# Patient Record
Sex: Female | Born: 1935 | Race: White | Hispanic: No | Marital: Married | Smoking: Never smoker
Health system: Southern US, Community
[De-identification: ages and names within clinical notes are randomized; demographics above are authoritative.]

## PROBLEM LIST (undated history)

## (undated) DIAGNOSIS — I2510 Atherosclerotic heart disease of native coronary artery without angina pectoris: Secondary | ICD-10-CM

## (undated) DIAGNOSIS — M169 Osteoarthritis of hip, unspecified: Secondary | ICD-10-CM

## (undated) DIAGNOSIS — I4891 Unspecified atrial fibrillation: Secondary | ICD-10-CM

## (undated) DIAGNOSIS — R000 Tachycardia, unspecified: Secondary | ICD-10-CM

## (undated) DIAGNOSIS — E785 Hyperlipidemia, unspecified: Secondary | ICD-10-CM

## (undated) DIAGNOSIS — H409 Unspecified glaucoma: Secondary | ICD-10-CM

## (undated) HISTORY — DX: Atherosclerotic heart disease of native coronary artery without angina pectoris: I25.10

## (undated) HISTORY — DX: Osteoarthritis of hip, unspecified: M16.9

## (undated) HISTORY — DX: Unspecified glaucoma: H40.9

## (undated) HISTORY — PX: ABLATION: SHX5711

## (undated) HISTORY — DX: Unspecified atrial fibrillation: I48.91

## (undated) HISTORY — DX: Hyperlipidemia, unspecified: E78.5

---

## 2012-01-26 DIAGNOSIS — F458 Other somatoform disorders: Secondary | ICD-10-CM | POA: Insufficient documentation

## 2020-02-18 ENCOUNTER — Other Ambulatory Visit: Payer: Self-pay

## 2020-02-18 ENCOUNTER — Encounter: Payer: Self-pay | Admitting: Internal Medicine

## 2020-02-18 ENCOUNTER — Ambulatory Visit (INDEPENDENT_AMBULATORY_CARE_PROVIDER_SITE_OTHER): Payer: Medicare Other | Admitting: Internal Medicine

## 2020-02-18 VITALS — BP 102/70 | HR 100 | Temp 99.1°F | Ht 67.5 in | Wt 133.5 lb

## 2020-02-18 DIAGNOSIS — R5383 Other fatigue: Secondary | ICD-10-CM

## 2020-02-18 DIAGNOSIS — M169 Osteoarthritis of hip, unspecified: Secondary | ICD-10-CM | POA: Insufficient documentation

## 2020-02-18 DIAGNOSIS — E785 Hyperlipidemia, unspecified: Secondary | ICD-10-CM | POA: Diagnosis not present

## 2020-02-18 DIAGNOSIS — I480 Paroxysmal atrial fibrillation: Secondary | ICD-10-CM | POA: Diagnosis not present

## 2020-02-18 DIAGNOSIS — I4891 Unspecified atrial fibrillation: Secondary | ICD-10-CM | POA: Insufficient documentation

## 2020-02-18 DIAGNOSIS — M160 Bilateral primary osteoarthritis of hip: Secondary | ICD-10-CM

## 2020-02-18 DIAGNOSIS — I2510 Atherosclerotic heart disease of native coronary artery without angina pectoris: Secondary | ICD-10-CM

## 2020-02-18 DIAGNOSIS — H409 Unspecified glaucoma: Secondary | ICD-10-CM

## 2020-02-18 NOTE — Patient Instructions (Signed)
-  Nice seeing you today!!  -Referrals to cardiology and eye doctor have been requested today.  -Please send [place] records from your previous doctor in [place].  -See you back in 6 months.

## 2020-02-18 NOTE — Progress Notes (Signed)
New Patient Office Visit     This visit occurred during the SARS-CoV-2 public health emergency.  Safety protocols were in place, including screening questions prior to the visit, additional usage of staff PPE, and extensive cleaning of exam room while observing appropriate contact time as indicated for disinfecting solutions.    CC/Reason for Visit: Establish care, discuss chronic conditions, discuss an acute concern Previous PCP: In [place] Last Visit: June 2021  HPI: Brooke [name] is a 85 y.o. female who is coming in today for the above mentioned reasons.  She recently moved from [place] to [place] to be closer to her family.  Past Medical History is significant for: Coronary artery disease status post stent placement in the past 12 months, atrial fibrillation status post multiple ablations in the last year, she states she occasionally will feel like her heart is racing.  History of hyperlipidemia, glaucoma, bilateral hip osteoarthritis, she is status post a right hip replacement.  She has been complaining of decreased energy.  Feels a little sad and depressed.  She denies chest pain or shortness of breath.  She has had her Covid vaccines.   Past Medical/Surgical History: Past Medical History:  Diagnosis Date  . Atrial fibrillation (HCC)   . CAD (coronary artery disease)   . Glaucoma   . Hyperlipidemia   . OA (osteoarthritis) of hip     History reviewed. No pertinent surgical history.  Social History:  reports that she has never smoked. She has never used smokeless tobacco. She reports that she does not drink alcohol and does not use drugs.  Allergies: No Known Allergies  Family History:  Family History  Problem Relation Age of Onset  . CVA Mother   . CAD Father   . Aortic stenosis Sister      Current Outpatient Medications:  .  ALPRAZolam (XANAX) 0.5 MG tablet, Take by mouth., Disp: , Rfl:  .  amLODipine (NORVASC) 5 MG tablet, Take by mouth., Disp: , Rfl:  .   apixaban (ELIQUIS) 5 MG TABS tablet, Take 1 tablet by mouth 2 (two) times daily., Disp: , Rfl:  .  Aspirin Buf,CaCarb-MgCarb-MgO, 81 MG TABS, Take by mouth., Disp: , Rfl:  .  atorvastatin (LIPITOR) 40 MG tablet, Take by mouth., Disp: , Rfl:  .  Cholecalciferol 25 MCG (1000 UT) tablet, Take by mouth., Disp: , Rfl:  .  flecainide (TAMBOCOR) 50 MG tablet, Take by mouth., Disp: , Rfl:  .  latanoprost (XALATAN) 0.005 % ophthalmic solution, Place 1 drop into both eyes at bedtime., Disp: , Rfl:  .  losartan (COZAAR) 50 MG tablet, Take 1 tablet by mouth 2 (two) times daily., Disp: , Rfl:  .  metoprolol tartrate (LOPRESSOR) 25 MG tablet, Take 1 tablet by mouth 2 (two) times daily as needed., Disp: , Rfl:   Review of Systems:  Constitutional: Denies fever, chills, diaphoresis, appetite change. HEENT: Denies photophobia, eye pain, redness, hearing loss, ear pain, congestion, sore throat, rhinorrhea, sneezing, mouth sores, trouble swallowing, neck pain, neck stiffness and tinnitus.   Respiratory: Denies SOB, DOE, cough, chest tightness,  and wheezing.   Cardiovascular: Denies chest pain, palpitations and leg swelling.  Gastrointestinal: Denies nausea, vomiting, abdominal pain, diarrhea, constipation, blood in stool and abdominal distention.  Genitourinary: Denies dysuria, urgency, frequency, hematuria, flank pain and difficulty urinating.  Endocrine: Denies: hot or cold intolerance, sweats, changes in hair or nails, polyuria, polydipsia. Musculoskeletal: Denies myalgias, back pain, joint swelling, arthralgias and gait problem.  Skin: Denies pallor,  rash and wound.  Neurological: Denies dizziness, seizures, syncope, weakness, light-headedness, numbness and headaches.  Hematological: Denies adenopathy. Easy bruising, personal or family bleeding history  Psychiatric/Behavioral: Denies suicidal ideation, mood changes, confusion, nervousness, sleep disturbance and agitation    Physical Exam: Vitals:    02/18/20 1410  BP: 102/70  Pulse: 100  Temp: 99.1 F (37.3 C)  TempSrc: Oral  SpO2: 96%  Weight: 133 lb 8 oz (60.6 kg)  Height: 5' 7.5" (1.715 m)   Body mass index is 20.6 kg/m.  Constitutional: NAD, calm, comfortable Eyes: PERRL, lids and conjunctivae normal ENMT: Mucous membranes are moist.  Respiratory: clear to auscultation bilaterally, no wheezing, no crackles. Normal respiratory effort. No accessory muscle use.  Cardiovascular: Regular rate and rhythm, no murmurs / rubs / gallops. No extremity edema. 2+ pedal pulses. No carotid bruits.  Neurologic: Grossly intact and nonfocal Psychiatric: Normal judgment and insight. Alert and oriented x 3. Normal mood.    Impression and Plan:  Coronary artery disease involving native coronary artery of native heart without angina pectoris  - Plan: Ambulatory referral to Cardiology -Especially as her extreme fatigue could be an anginal equivalent.  Paroxysmal atrial fibrillation (HCC)  - Plan: Ambulatory referral to Cardiology -Status post 3 ablations in 2020. -She is on flecainide, metoprolol, anticoagulated on Eliquis. -She appears to be in sinus rhythm today.  Hyperlipidemia, unspecified hyperlipidemia type  -Obtain most recent lipid panel.  Glaucoma, unspecified glaucoma type, unspecified laterality  - Plan: Ambulatory referral to Ophthalmology  Osteoarthritis of both hips, unspecified osteoarthritis type -Status post right hip replacement, she does not believe she wants to replace her left hip as her heart situation is not ideal.  Fatigue -Differential diagnosis at this point is broad and could include depression, an anginal equivalent especially given her history of coronary artery disease, hypothyroidism, vitamin D/vitamin B12 deficiency, anemia. -   Office Visit from 02/18/2020 in [facility] [place]  PHQ-9 Total Score 4     -She has scored a 4 on PHQ-9 today. -She prefers to obtain recent lab work that she  had in June from her PCP in [place] before [employer] here.  She will have this faxed over to [place]. -If lab work is normal can consider treatment for depression. -She will also be referred to cardiology given her complex cardiac history.    Patient Instructions  -Nice seeing you today!!  -Referrals to cardiology and eye doctor have been requested today.  -Please send [place] records from your previous doctor in [place].  -See you back in 6 months.     Chaya Jan, MD  Primary Care at [facility]

## 2020-02-23 ENCOUNTER — Other Ambulatory Visit: Payer: Self-pay

## 2020-02-23 ENCOUNTER — Encounter: Payer: Self-pay | Admitting: Cardiology

## 2020-02-23 ENCOUNTER — Ambulatory Visit (INDEPENDENT_AMBULATORY_CARE_PROVIDER_SITE_OTHER): Payer: Medicare Other | Admitting: Cardiology

## 2020-02-23 VITALS — BP 124/80 | HR 75 | Temp 97.1°F | Ht 66.5 in | Wt 134.0 lb

## 2020-02-23 DIAGNOSIS — Z7189 Other specified counseling: Secondary | ICD-10-CM

## 2020-02-23 DIAGNOSIS — I25119 Atherosclerotic heart disease of native coronary artery with unspecified angina pectoris: Secondary | ICD-10-CM | POA: Diagnosis not present

## 2020-02-23 DIAGNOSIS — I480 Paroxysmal atrial fibrillation: Secondary | ICD-10-CM | POA: Diagnosis not present

## 2020-02-23 DIAGNOSIS — R5383 Other fatigue: Secondary | ICD-10-CM

## 2020-02-23 DIAGNOSIS — R0600 Dyspnea, unspecified: Secondary | ICD-10-CM | POA: Diagnosis not present

## 2020-02-23 DIAGNOSIS — I10 Essential (primary) hypertension: Secondary | ICD-10-CM

## 2020-02-23 DIAGNOSIS — E785 Hyperlipidemia, unspecified: Secondary | ICD-10-CM

## 2020-02-23 DIAGNOSIS — R0609 Other forms of dyspnea: Secondary | ICD-10-CM

## 2020-02-23 DIAGNOSIS — Z8673 Personal history of transient ischemic attack (TIA), and cerebral infarction without residual deficits: Secondary | ICD-10-CM

## 2020-02-23 NOTE — Progress Notes (Signed)
Cardiology Office Note:    Date:  02/23/2020   ID:  Brooke [name], DOB 09-27-1934, MRN [id number]  PCP:  Philip Aspen, Limmie Patricia, MD  Cardiologist:  Jodelle Red, MD  Referring MD: Philip Aspen, Estel*   CC: new patient evaluation for fatigue  History of Present Illness:    Brooke [name] is a 85 y.o. female with a hx of CAD, hyperlipidemia, atrial fibrillation, atrial flutter, paroxysmal atrial tachycardia, TIA, hypertension who is seen as a new consult at the request of Philip Aspen, Almira Bar* for the evaluation and management of increasing fatigue.  Note from 02/18/20 reviewed. Initial visit with Dr. Flonnie Hailstone, had moved from [place] to [place], prior records requested. Noted to have prior history of CAD with stent placement (noted as recent), atrial fibrillation with multiple past ablations. Noted to have increasing fatigue.  Able to access note from 09/22/19 from Dr. Weber Cooks at [facility]. Noted cardiac history summarized as below: CTI Flutter ablation 05/13/2018 Trialed on tikosyn, did not control her her atrial fibrillation. Noted to have conversion pauses. PVI/SVT ablation 05/16/2019 Atrial tachycardia noted post ablation 05/2019, had EP study On flecainide for brief paroxysmal atrial tachycardia seen on monitor. Amiodarone caused her to have palpitations  Patient concerns: Since her ablations (most recent 05/2019), hasn't had appreciable atrial fibrillation events, but does have occasional paroxysmal Atach events. Longest last 20-30 minutes. Occurs about once a month or so. No clear aggravating/alleviating factors, though does notice if she is nervous and "lets things get to me" her heart will speed up.   Has intermittent chest discomfort. Happens about once a month, different timing than her racing heart beats. She attributes this to long standing Da [place] syndrome. However, this is worse with exertion and better with rest. This has been a stable frequency of  symptoms for some time. Episodes last about an hour. No lightheadedness, nausea, diaphoresis, shortness of breath.   Has been told by several doctors that her flecainide is dangerous after having stents placed. She notes that nothing else has worked.   Wears nocturnal O2, reports that she hasn't been told she needs CPAP.   Energy level has been very low. Can barely wash dishes without feeling like she needs to sit down. Feels weak and shaky. Has also noted depressed mood, has been trialed on depression medications in the past but doesn't feel like it helped. No thoughts of hurting herself. Feels weak and fatigued even when she is resting, more of a constant issue than an exertional one.   Able to walk without chest pain or shortness of breath, though walks slowly with her cane.   She had stents placed after she had two episodes of syncope without any prodrome. No other symptoms at the time.  Denies shortness of breath at rest or with normal exertion. No PND, orthopnea, LE edema or unexpected weight gain. No syncope.  ROS for intermittent lower abdominal pain, can be both right and left sided. Worse with twisting.   Doesn't remember having any trouble with her chemical stress test in the past.   Past Medical History:  Diagnosis Date  . Atrial fibrillation (HCC)   . CAD (coronary artery disease)   . Glaucoma   . Hyperlipidemia   . OA (osteoarthritis) of hip     No past surgical history on file.  Current Medications: Current Outpatient Medications on File Prior to Visit  Medication Sig  . ALPRAZolam (XANAX) 0.5 MG tablet Take by mouth.  Marland Kitchen amLODipine (NORVASC) 5 MG tablet  Take by mouth.  Marland Kitchen apixaban (ELIQUIS) 5 MG TABS tablet Take 1 tablet by mouth 2 (two) times daily.  . Aspirin Buf,CaCarb-MgCarb-MgO, 81 MG TABS Take by mouth.  Marland Kitchen atorvastatin (LIPITOR) 40 MG tablet Take by mouth.  . Cholecalciferol 25 MCG (1000 UT) tablet Take by mouth.  . flecainide (TAMBOCOR) 50 MG tablet Take by  mouth.  . latanoprost (XALATAN) 0.005 % ophthalmic solution Place 1 drop into both eyes at bedtime.  Marland Kitchen losartan (COZAAR) 50 MG tablet Take 1 tablet by mouth 2 (two) times daily.  . metoprolol tartrate (LOPRESSOR) 25 MG tablet Take 1 tablet by mouth 2 (two) times daily as needed.   No current facility-administered medications on file prior to visit.     Allergies:   Patient has no known allergies.   Social History   Tobacco Use  . Smoking status: Never Smoker  . Smokeless tobacco: Never Used  Substance Use Topics  . Alcohol use: Never  . Drug use: Never    Family History: family history includes Aortic stenosis in her sister; CAD in her father; CVA in her mother.  ROS:   Please see the history of present illness.  Additional pertinent ROS: Constitutional: Negative for chills, fever, night sweats, unintentional weight loss  HENT: Negative for ear pain and hearing loss.   Eyes: Negative for loss of vision and eye pain.  Respiratory: Negative for cough, sputum, wheezing.   Cardiovascular: See HPI. Gastrointestinal: Negative for melena, and hematochezia. Positive for intermittent abdominal pain Genitourinary: Negative for dysuria and hematuria.  Musculoskeletal: Negative for falls and myalgias.  Skin: Negative for itching and rash.  Neurological: Negative for focal weakness, focal sensory changes and loss of consciousness.  Endo/Heme/Allergies: Does not bruise/bleed easily.     EKGs/Labs/Other Studies Reviewed:    The following studies were reviewed today: Echo 05/10/2018 (care everywhere) Interpretation Summary  A complete two-dimensional transthoracic echocardiogram with color flow  Doppler and spectral Doppler was performed. The left ventricle is grossly  normal size.  There is proximal septal thickening of the elderly.  The left ventricular ejection fraction is normal (65-70%).  The left ventricular wall motion is normal.  The aortic valve is trileaflet.  Mild aortic  sclerosis is present with good valvular opening.  There is mild [1+] aortic regurgitation present.  Unable to adequately determine diastolic dysfunction.  There is moderate (2+) tricuspid regurgitation.  Right ventricular systolic pressure is normal.  There is mild (1+) mitral regurgitation.  The left atrium is moderately dilated.  The right atrium is moderately dilated.   Cardiac cath 11/23/2016 (care everywhere) PRESSURE DETERMINATIONS: Aortic pressure was 172/76 mmHg with a mean  pressure 114 mmHg. Left ankle pressure was 172/18 mmHg. There was no  pressure gradient noted on pullback acrossthe aortic valve.   CORONARY ANGIOGRAPHY: The coronary angiograms are technically  satisfactory.   LEFT MAIN: Left main coronary artery has luminal irregularities.   LEFT ANTERIOR DESCENDING: Left anterior descending coronary artery has  mild proximal to mid ectasia with proximal 30% narrowing. The vessel is  moderate in size and extends transapically. First, second, and third  diagonal branches are small with luminal irregularities. There are  sequential obstructive narrowings in the mid to distal vessel with maximal  narrowing of 80% to 90%.   CIRCUMFLEX: The circumflex system gives rise to a moderate size first  obtuse marginal branch with luminal irregularities. Second obtuse  marginal branch has critical 90% proximal narrowing. The distal  circumflex gives rise to a small  to moderate size branch, which supplies a  posterolateral territory and has luminal irregularities.   RIGHT CORONARY ARTERY: The right coronary artery is a small caliber  codominant vessel with luminal irregularities.   CORONARY INTERVENTION: Following angiography, we proceeded with  intervention for the critical obstructive narrowings within the left  anterior descending coronary artery followed by the circumflex second  obtuse marginal branch.The patient received a weight-based Angiomax  bolus  followed by an Angiomax infusion. She also received 180 mg loading  dose of Brilinta.   The ostium of the left main was engaged with a 6-French XB 3.5 guiding  catheter. The area of culprit disease in the left anterior descending  coronary artery was crossed without difficulty with a 0.014-inch 190 cm  long BMW wire. Predilation was performed with a 2.0 x 12 mm Sprinter  Legend balloon inflated to 10 atmospheres for 20 seconds. We then  administered 200 mcg of intracoronary nitroglycerin. The stenosis was  stented with a 2.25 x 22 mm medicated Resolute Onyx stent deployed at 12  atmospheres for 30 seconds. The proximal and mid portion of the stent  were postdilated with a 2.5 x 15 mm Ottertail Trek balloon inflated to 12  atmospheres for 20 seconds.   Imaging following removal of intracoronary guidewire reveals an acceptable  result. Preprocedure stenosis was maximally 90%, postprocedure residual  was 0%. Pre- and post-procedure TIMI flow are grade 3. This was a type C  lesion.   INTERVENTION OF THE CIRCUMFLEX SECOND OBTUSE MARGINAL BRANCH: We then  directed the BMW wire down the second obtuse marginal branch.   Pre-dilatation was performed with a 2.0 x 12 mm Sprinter Legend balloon  inflated to 10 atmospheres for 20 seconds. We then stented the stenosis  with a 2.5 x 15 mm medicated Resolute Onyx stent, which was deployed at 12  atmospheres for 20 seconds.   Imaging following removal of intracoronary guidewire reveals an acceptable  result. Preprocedure stenosis was 90%, postprocedure residual was 0%.   Pre- and post-procedure TIMI flow are grade 3. This was a type C lesion.   FINAL DIAGNOSES:  1. Critical obstructive narrowing of the mid to distal left anterior  descending coronary artery, status post stenting with placement of a  medicated stent, with acceptable results.  2. Obstructive narrowing second obtuse marginal branch, status post  stenting with placement of a  medicated stent with acceptable results.   RECOMMENDATIONS: Continued post-procedure care with dual antiplatelet  therapy.   EKG:  EKG is personally reviewed.  The ekg ordered today demonstrates NSR at 75 bpm  Recent Labs: No results found for requested labs within last 8760 hours.  Recent Lipid Panel No results found for: CHOL, TRIG, HDL, CHOLHDL, VLDL, LDLCALC, LDLDIRECT  Physical Exam:    VS:  BP 124/80   Pulse 75   Temp (!) 97.1 F (36.2 C)   Ht 5' 6.5" (1.689 m)   Wt 134 lb (60.8 kg)   SpO2 94%   BMI 21.30 kg/m     Wt Readings from Last 3 Encounters:  02/18/20 133 lb 8 oz (60.6 kg)    GEN: Well nourished, well developed in no acute distress HEENT: Normal, moist mucous membranes NECK: No JVD CARDIAC: regular rhythm, normal S1 and S2, no rubs or gallops. No murmurs. VASCULAR: Radial and DP pulses 2+ bilaterally. No carotid bruits RESPIRATORY:  Clear to auscultation without rales, wheezing or rhonchi  ABDOMEN: Soft, non-tender, non-distended MUSCULOSKELETAL:  Ambulates independently SKIN: Warm and dry,  no edema NEUROLOGIC:  Alert and oriented x 3. No focal neuro deficits noted. PSYCHIATRIC:  Normal affect    ASSESSMENT:    1. Coronary artery disease involving native coronary artery of native heart with angina pectoris (HCC)   2. Fatigue, unspecified type   3. Paroxysmal atrial fibrillation (HCC)   4. DOE (dyspnea on exertion)   5. Hyperlipidemia, unspecified hyperlipidemia type   6. History of TIA (transient ischemic attack)   7. Essential hypertension   8. Cardiac risk counseling   9. Counseling on health promotion and disease prevention    PLAN:    Fatigue, dyspnea on exertion, known history of CAD with prior stents: had never had traditional angina, concern this is an anginal equivalent -discussed treadmill stress, nuclear stress/lexiscan, and CT coronary angiography. Discussed pros and cons of each, including but not limited to false positive/false  negative risk, radiation risk, and risk of IV contrast dye. Based on shared decision making, decision was made to pursue lexiscan myoview nuclear stress test -will get echo to evaluate for other potential structure heart issues  Atrial fibrillation, atrial flutter, atrial tachycardia: extensive history, see summary -I echoed what other providers have told her, which is that I am concerned about flecainide given her known CAD and concern for angina -she understands the risks but states that nothing else has worked for her -will ask EP to discuss if she has any other options -currently taking apixaban, flecainide, and uses metoprolol only PRN Apixaban is very expensive, $400 for 3 mos while she is in the donut hole.   History of TIA Hyperlipidemia CAD -continue atorvastatin 40 mg daily -no aspirin as she is on apixaban  Hypertension: -continue losartan  Cardiac risk counseling and prevention recommendations: -recommend heart healthy/Mediterranean diet, with whole grains, fruits, vegetable, fish, lean meats, nuts, and olive oil. Limit salt. -recommend moderate walking, 3-5 times/week for 30-50 minutes each session. Aim for at least 150 minutes.week. Goal should be pace of 3 miles/hours, or walking 1.5 miles in 30 minutes -recommend avoidance of tobacco products. Avoid excess alcohol.  Plan for follow up: 3 mos or sooner based on results of testing  Jodelle Red, MD, PhD [place]  [facility]    Medication Adjustments/Labs and Tests Ordered: Current medicines are reviewed at length with the patient today.  Concerns regarding medicines are outlined above.  Orders Placed This Encounter  Procedures  . Ambulatory referral to Cardiac Electrophysiology  . MYOCARDIAL PERFUSION IMAGING  . EKG 12-Lead  . ECHOCARDIOGRAM COMPLETE   No orders of the defined types were placed in this encounter.   Patient Instructions  Medication Instructions:  Your Physician recommend you  continue on your current medication as directed.    *If you need a refill on your cardiac medications before your next appointment, please call your pharmacy*   Lab Work: None   Testing/Procedures: Your physician has requested that you have an echocardiogram. Echocardiography is a painless test that uses sound waves to create images of your heart. It provides your doctor with information about the size and shape of your heart and how well your heart's chambers and valves are working. This procedure takes approximately one hour. There are no restrictions for this procedure. [address]  Your physician has requested that you have a lexiscan myoview. For further information please visit [email or web address] Please follow instruction sheet, as given. [address]   Follow-Up: At [facility], you and your health needs are our priority.  As part of our continuing mission to provide you with exceptional heart care, we have created designated Provider Care Teams.  These Care Teams include your primary Cardiologist (physician) and Advanced Practice Providers (APPs -  Physician Assistants and Nurse Practitioners) who all work together to provide you with the care you need, when you need it.  We recommend signing up for the patient portal called "MyChart".  Sign up information is provided on this After Visit Summary.  MyChart is used to connect with patients for Virtual Visits (Telemedicine).  Patients are able to view lab/test results, encounter notes, upcoming appointments, etc.  Non-urgent messages can be sent to your provider as well.   To learn more about what you can do with MyChart, go to [email or web address].    Your next appointment:   3 month(s)  The format for your next appointment:   In Person  Provider:   Jodelle RedBridgette Deontra Pereyra, MD  You are scheduled for a Myocardial Perfusion Imaging Study.  Please arrive 15 minutes prior to your  appointment time for registration and insurance purposes.  The test will take approximately 3 to 4 hours to complete; you may bring reading material.  If someone comes with you to your appointment, they will need to remain in the main lobby due to limited space in the testing area. **If you are pregnant or breastfeeding, please notify the nuclear lab prior to your appointment**  How to prepare for your Myocardial Perfusion Test: . Do not eat or drink 3 hours prior to your test, except you may have water. . Do not consume products containing caffeine (regular or decaffeinated) 12 hours prior to your test. (ex: coffee, chocolate, sodas, tea). . Do bring a list of your current medications with you.  If not listed below, you may take your medications as normal. . Do wear comfortable clothes (no dresses or overalls) and walking shoes, tennis shoes preferred (No heels or open toe shoes are allowed). . Do NOT wear cologne, perfume, aftershave, or lotions (deodorant is allowed). . If these instructions are not followed, your test will have to be rescheduled.  Please report to [address] for your test.  If you have questions or concerns about your appointment, you can call the Nuclear Lab at [phone].  If you cannot keep your appointment, please provide 24 hours notification to the Nuclear Lab, to avoid a possible $50 charge to your account.    Signed, Jodelle RedBridgette Kasmira Cacioppo, MD PhD 02/23/2020  [facility]

## 2020-02-23 NOTE — Patient Instructions (Signed)
Medication Instructions:  Your Physician recommend you continue on your current medication as directed.    *If you need a refill on your cardiac medications before your next appointment, please call your pharmacy*   Lab Work: None   Testing/Procedures: Your physician has requested that you have an echocardiogram. Echocardiography is a painless test that uses sound waves to create images of your heart. It provides your doctor with information about the size and shape of your heart and how well your heart's chambers and valves are working. This procedure takes approximately one hour. There are no restrictions for this procedure. [address]  Your physician has requested that you have a lexiscan myoview. For further information please visit [email or web address] Please follow instruction sheet, as given. [address]   Follow-Up: At [facility], you and your health needs are our priority.  As part of our continuing mission to provide you with exceptional heart care, we have created designated Provider Care Teams.  These Care Teams include your primary Cardiologist (physician) and Advanced Practice Providers (APPs -  Physician Assistants and Nurse Practitioners) who all work together to provide you with the care you need, when you need it.  We recommend signing up for the patient portal called "MyChart".  Sign up information is provided on this After Visit Summary.  MyChart is used to connect with patients for Virtual Visits (Telemedicine).  Patients are able to view lab/test results, encounter notes, upcoming appointments, etc.  Non-urgent messages can be sent to your provider as well.   To learn more about what you can do with MyChart, go to [email or web address].    Your next appointment:   3 month(s)  The format for your next appointment:   In Person  Provider:   Jodelle Red, MD  You are scheduled for a Myocardial Perfusion Imaging  Study.  Please arrive 15 minutes prior to your appointment time for registration and insurance purposes.  The test will take approximately 3 to 4 hours to complete; you may bring reading material.  If someone comes with you to your appointment, they will need to remain in the main lobby due to limited space in the testing area. **If you are pregnant or breastfeeding, please notify the nuclear lab prior to your appointment**  How to prepare for your Myocardial Perfusion Test: . Do not eat or drink 3 hours prior to your test, except you may have water. . Do not consume products containing caffeine (regular or decaffeinated) 12 hours prior to your test. (ex: coffee, chocolate, sodas, tea). . Do bring a list of your current medications with you.  If not listed below, you may take your medications as normal. . Do wear comfortable clothes (no dresses or overalls) and walking shoes, tennis shoes preferred (No heels or open toe shoes are allowed). . Do NOT wear cologne, perfume, aftershave, or lotions (deodorant is allowed). . If these instructions are not followed, your test will have to be rescheduled.  Please report to [address] for your test.  If you have questions or concerns about your appointment, you can call the Nuclear Lab at [phone].  If you cannot keep your appointment, please provide 24 hours notification to the Nuclear Lab, to avoid a possible $50 charge to your account.

## 2020-03-09 ENCOUNTER — Telehealth: Payer: Self-pay | Admitting: Internal Medicine

## 2020-03-09 ENCOUNTER — Telehealth (HOSPITAL_COMMUNITY): Payer: Self-pay

## 2020-03-09 NOTE — Telephone Encounter (Signed)
Encounter complete. 

## 2020-03-09 NOTE — Telephone Encounter (Signed)
Patient states her eye dr that she went to in [place] gave her Latanoprost Op Sol 0.005% drops for [place].  She moved from [place] and now lives in [place].  She said Dr. Ardyth Harps is supposed to be referring her to a new eye dr, however she hasn't heard anything.  She is almost out of eye drops and wants to know if Dr. Ardyth Harps will call in some for her.  She usually gets  90 day supply from her other dr.  Pharmacy- [facility]

## 2020-03-10 NOTE — Telephone Encounter (Signed)
Ok to refill until she can be seen by ophtho. Can we check on the status of this referral?

## 2020-03-10 NOTE — Telephone Encounter (Signed)
Pt called to check on her message from yesterday. Informed pt that they have not had a moment to review it but as soon as we get a reply we will reach out to her.   Pt would like a call back at [phone]

## 2020-03-11 ENCOUNTER — Other Ambulatory Visit: Payer: Self-pay

## 2020-03-11 ENCOUNTER — Ambulatory Visit (HOSPITAL_COMMUNITY)
Admission: RE | Admit: 2020-03-11 | Payer: Medicare Other | Source: Ambulatory Visit | Attending: Cardiology | Admitting: Cardiology

## 2020-03-11 ENCOUNTER — Encounter: Payer: Self-pay | Admitting: *Deleted

## 2020-03-11 MED ORDER — LATANOPROST 0.005 % OP SOLN: OPHTHALMIC | 1 refills | Status: DC

## 2020-03-11 NOTE — Telephone Encounter (Signed)
Refill sent Referral sent to a different office. Left message on machine for patient to return our call.

## 2020-03-11 NOTE — Telephone Encounter (Signed)
Pt returned the call to get the status of her Rx pt is aware that the Rx was sent in.

## 2020-03-12 ENCOUNTER — Ambulatory Visit (HOSPITAL_COMMUNITY): Payer: Medicare Other | Attending: Cardiology

## 2020-03-12 DIAGNOSIS — I480 Paroxysmal atrial fibrillation: Secondary | ICD-10-CM | POA: Diagnosis not present

## 2020-03-12 DIAGNOSIS — I25119 Atherosclerotic heart disease of native coronary artery with unspecified angina pectoris: Secondary | ICD-10-CM | POA: Diagnosis not present

## 2020-03-12 DIAGNOSIS — R0609 Other forms of dyspnea: Secondary | ICD-10-CM

## 2020-03-12 DIAGNOSIS — R0600 Dyspnea, unspecified: Secondary | ICD-10-CM | POA: Diagnosis not present

## 2020-03-12 DIAGNOSIS — R5383 Other fatigue: Secondary | ICD-10-CM | POA: Diagnosis present

## 2020-03-12 LAB — ECHOCARDIOGRAM COMPLETE
Area-P 1/2: 3.89 cm2
S' Lateral: 3.1 cm

## 2020-03-18 ENCOUNTER — Telehealth (HOSPITAL_COMMUNITY): Payer: Self-pay

## 2020-03-18 NOTE — Telephone Encounter (Signed)
Encounter complete. 

## 2020-03-23 ENCOUNTER — Other Ambulatory Visit: Payer: Self-pay

## 2020-03-23 ENCOUNTER — Ambulatory Visit (HOSPITAL_COMMUNITY)
Admission: RE | Admit: 2020-03-23 | Discharge: 2020-03-23 | Disposition: A | Discharge status: Home / Self Care | Payer: Medicare Other | Source: Ambulatory Visit | Attending: Internal Medicine | Admitting: Internal Medicine

## 2020-03-23 ENCOUNTER — Telehealth: Payer: Self-pay | Admitting: Cardiology

## 2020-03-23 ENCOUNTER — Encounter (HOSPITAL_COMMUNITY): Payer: Medicare Other

## 2020-03-23 DIAGNOSIS — R5383 Other fatigue: Secondary | ICD-10-CM

## 2020-03-23 DIAGNOSIS — R0600 Dyspnea, unspecified: Secondary | ICD-10-CM | POA: Insufficient documentation

## 2020-03-23 DIAGNOSIS — I25119 Atherosclerotic heart disease of native coronary artery with unspecified angina pectoris: Secondary | ICD-10-CM | POA: Diagnosis not present

## 2020-03-23 DIAGNOSIS — I480 Paroxysmal atrial fibrillation: Secondary | ICD-10-CM

## 2020-03-23 LAB — MYOCARDIAL PERFUSION IMAGING
LV dias vol: 86 mL (ref 46–106)
LV sys vol: 31 mL
Peak HR: 82 {beats}/min
Rest HR: 66 {beats}/min
SDS: 0
SRS: 1
SSS: 1
TID: 1.12

## 2020-03-23 MED ORDER — TECHNETIUM TC 99M TETROFOSMIN IV KIT
32.0000 | PACK | Freq: Once | INTRAVENOUS | Status: AC | PRN
  Administered 2020-03-23: 32 via INTRAVENOUS
  Filled 2020-03-23: qty 32

## 2020-03-23 MED ORDER — AMINOPHYLLINE 25 MG/ML IV SOLN
75.0000 mg | Freq: Once | INTRAVENOUS | Status: AC
  Administered 2020-03-23: 75 mg via INTRAVENOUS

## 2020-03-23 MED ORDER — TECHNETIUM TC 99M TETROFOSMIN IV KIT
10.1000 | PACK | Freq: Once | INTRAVENOUS | Status: AC | PRN
  Administered 2020-03-23: 10.1 via INTRAVENOUS
  Filled 2020-03-23: qty 11

## 2020-03-23 MED ORDER — REGADENOSON 0.4 MG/5ML IV SOLN
0.4000 mg | Freq: Once | INTRAVENOUS | Status: AC
  Administered 2020-03-23: 0.4 mg via INTRAVENOUS

## 2020-03-23 NOTE — Telephone Encounter (Signed)
Follow Up:     Returning Brooke [name]'s call from yesterday, concerning his test results.  Please call back about 1:30

## 2020-03-23 NOTE — Telephone Encounter (Signed)
Pt updated with ECHO results and verbalized understanding.  

## 2020-03-29 ENCOUNTER — Other Ambulatory Visit: Payer: Self-pay | Admitting: Internal Medicine

## 2020-04-08 ENCOUNTER — Encounter: Payer: Self-pay | Admitting: Internal Medicine

## 2020-04-08 ENCOUNTER — Other Ambulatory Visit: Payer: Self-pay

## 2020-04-08 ENCOUNTER — Ambulatory Visit (INDEPENDENT_AMBULATORY_CARE_PROVIDER_SITE_OTHER): Payer: Medicare Other | Admitting: Internal Medicine

## 2020-04-08 VITALS — BP 120/80 | HR 73 | Temp 98.0°F | Wt 133.0 lb

## 2020-04-08 DIAGNOSIS — F339 Major depressive disorder, recurrent, unspecified: Secondary | ICD-10-CM

## 2020-04-08 DIAGNOSIS — Z23 Encounter for immunization: Secondary | ICD-10-CM

## 2020-04-08 DIAGNOSIS — I480 Paroxysmal atrial fibrillation: Secondary | ICD-10-CM

## 2020-04-08 DIAGNOSIS — I25119 Atherosclerotic heart disease of native coronary artery with unspecified angina pectoris: Secondary | ICD-10-CM

## 2020-04-08 DIAGNOSIS — R5383 Other fatigue: Secondary | ICD-10-CM | POA: Diagnosis not present

## 2020-04-08 MED ORDER — SERTRALINE HCL 25 MG PO TABS: 25.0000 mg | ORAL_TABLET | Freq: Every day | ORAL | 1 refills | Status: DC

## 2020-04-08 NOTE — Patient Instructions (Signed)
-  Nice seeing you today!!  -Lab work today; will notify you once results are available.  -Start Zoloft 25 mg daily at bedtime.  -Flu vaccine today.  -Schedule follow up in 8 weeks.

## 2020-04-08 NOTE — Progress Notes (Signed)
Established Patient Office Visit     This visit occurred during the SARS-CoV-2 public health emergency.  Safety protocols were in place, including screening questions prior to the visit, additional usage of staff PPE, and extensive cleaning of exam room while observing appropriate contact time as indicated for disinfecting solutions.    CC/Reason for Visit: Follow-up fatigue, depressed mood  HPI: Brooke [name] is a 85 y.o. female who is coming in today for the above mentioned reasons. Past Medical History is significant for: Coronary artery disease status post stent placement in the past 12 months, atrial fibrillation status post multiple ablations in the last year, she states she occasionally will feel like her heart is racing.  History of hyperlipidemia, glaucoma, bilateral hip osteoarthritis, she is status post a right hip replacement.  She has been complaining of decreased energy.  Feels a little sad and depressed.  The depressed mood of fatigue has been increasing since her last visit in mid August.  Since I last saw her she has touch base with cardiologist and has had an echo that showed a normal ejection fraction with grade 3 diastolic dysfunction and a low risk myocardial perfusion study.  She is requesting flu vaccine today.   Past Medical/Surgical History: Past Medical History:  Diagnosis Date  . Atrial fibrillation (HCC)   . CAD (coronary artery disease)   . Glaucoma   . Hyperlipidemia   . OA (osteoarthritis) of hip     No past surgical history on file.  Social History:  reports that she has never smoked. She has never used smokeless tobacco. She reports that she does not drink alcohol and does not use drugs.  Allergies: Allergies  Allergen Reactions  . Hydrocodone-Acetaminophen Anaphylaxis    Family History:  Family History  Problem Relation Age of Onset  . CVA Mother   . CAD Father   . Aortic stenosis Sister      Current Outpatient Medications:  .   acetaminophen (TYLENOL) 325 MG tablet, Take by mouth., Disp: , Rfl:  .  ALPRAZolam (XANAX) 0.5 MG tablet, Take 0.5 mg by mouth 2 (two) times daily as needed. , Disp: , Rfl:  .  amLODipine (NORVASC) 5 MG tablet, Take by mouth., Disp: , Rfl:  .  apixaban (ELIQUIS) 5 MG TABS tablet, Take 1 tablet by mouth 2 (two) times daily., Disp: , Rfl:  .  aspirin 81 MG EC tablet, Take by mouth., Disp: , Rfl:  .  Aspirin Buf,CaCarb-MgCarb-MgO, 81 MG TABS, Take by mouth., Disp: , Rfl:  .  atorvastatin (LIPITOR) 40 MG tablet, Take by mouth., Disp: , Rfl:  .  Calcium Carbonate-Vitamin D3 600-400 MG-UNIT TABS, Take 3 tablets by mouth daily., Disp: , Rfl:  .  Cholecalciferol 25 MCG (1000 UT) tablet, Take by mouth., Disp: , Rfl:  .  flecainide (TAMBOCOR) 50 MG tablet, Take by mouth., Disp: , Rfl:  .  latanoprost (XALATAN) 0.005 % ophthalmic solution, INSTILL 1 DROP INTO BOTH  EYES AT BEDTIME, Disp: 5 mL, Rfl: 5 .  losartan (COZAAR) 50 MG tablet, Take 1 tablet by mouth 2 (two) times daily., Disp: , Rfl:  .  metoprolol tartrate (LOPRESSOR) 25 MG tablet, Take 1 tablet by mouth 2 (two) times daily as needed., Disp: , Rfl:  .  OXYGEN, Inhale 2 L into the lungs., Disp: , Rfl:  .  sertraline (ZOLOFT) 25 MG tablet, Take 1 tablet (25 mg total) by mouth daily., Disp: 90 tablet, Rfl: 1  Review  of Systems:  Constitutional: Denies fever, chills, diaphoresis, appetite change.  HEENT: Denies photophobia, eye pain, redness, hearing loss, ear pain, congestion, sore throat, rhinorrhea, sneezing, mouth sores, trouble swallowing, neck pain, neck stiffness and tinnitus.   Respiratory: Denies SOB, DOE, cough, chest tightness,  and wheezing.   Cardiovascular: Denies chest pain, palpitations and leg swelling.  Gastrointestinal: Denies nausea, vomiting, abdominal pain, diarrhea, constipation, blood in stool and abdominal distention.  Genitourinary: Denies dysuria, urgency, frequency, hematuria, flank pain and difficulty urinating.    Endocrine: Denies: hot or cold intolerance, sweats, changes in hair or nails, polyuria, polydipsia. Musculoskeletal: Denies myalgias, back pain, joint swelling, arthralgias and gait problem.  Skin: Denies pallor, rash and wound.  Neurological: Denies dizziness, seizures, syncope, weakness, light-headedness, numbness and headaches.  Hematological: Denies adenopathy. Easy bruising, personal or family bleeding history  Psychiatric/Behavioral: Denies suicidal ideation, confusion, nervousness, sleep disturbance and agitation    Physical Exam: Vitals:   04/08/20 1003  BP: 120/80  Pulse: 73  Temp: 98 F (36.7 C)  TempSrc: Oral  SpO2: 98%  Weight: 133 lb (60.3 kg)    Body mass index is 20.83 kg/m.   Constitutional: NAD, calm, comfortable Eyes: PERRL, lids and conjunctivae normal ENMT: Mucous membranes are moist. Respiratory: clear to auscultation bilaterally, no wheezing, no crackles. Normal respiratory effort. No accessory muscle use.  Cardiovascular: regular rate and rhythm, no murmurs / rubs / gallops. No extremity edema. .  Neurologic: Grossly intact and nonfocal Psychiatric: Normal judgment and insight. Alert and oriented x 3. Normal mood.    Impression and Plan:  Depression, recurrent (HCC) Fatigue, unspecified type -Suspect fatigue is a symptom of depression.  Recent low risk myocardial perfusion study basically removes cardiac etiology. -Check B12, vitamin D, TSH, CBC today. -   Office Visit from 04/08/2020 in [facility] [place]  PHQ-9 Total Score 10     -With elevated PHQ score, I will start her on Zoloft 25 mg daily and she will return in 8 weeks for follow-up.  Paroxysmal atrial fibrillation (HCC)  -Rate controlled on metoprolol, rhythm controlled on flecainide, anticoagulated on Eliquis. -Followed by cardiology.  Need for influenza vaccination -Flu vaccine administered today.   Patient Instructions  -Nice seeing you today!!  -Lab work  today; will notify you once results are available.  -Start Zoloft 25 mg daily at bedtime.  -Flu vaccine today.  -Schedule follow up in 8 weeks.     Chaya Jan, MD [facility]

## 2020-04-08 NOTE — Addendum Note (Signed)
Addended by: Kern Reap B on: 04/08/2020 04:53 PM   Modules accepted: Orders

## 2020-04-09 LAB — CBC WITH DIFFERENTIAL/PLATELET
Absolute Monocytes: 684 cells/uL (ref 200–950)
Basophils Absolute: 42 cells/uL (ref 0–200)
Basophils Relative: 0.7 %
Eosinophils Absolute: 180 cells/uL (ref 15–500)
Eosinophils Relative: 3 %
HCT: 43.7 % (ref 35.0–45.0)
Hemoglobin: 14.3 g/dL (ref 11.7–15.5)
Lymphs Abs: 1668 cells/uL (ref 850–3900)
MCH: 31.9 pg (ref 27.0–33.0)
MCHC: 32.7 g/dL (ref 32.0–36.0)
MCV: 97.5 fL (ref 80.0–100.0)
MPV: 11.2 fL (ref 7.5–12.5)
Monocytes Relative: 11.4 %
Neutro Abs: 3426 cells/uL (ref 1500–7800)
Neutrophils Relative %: 57.1 %
Platelets: 215 10*3/uL (ref 140–400)
RBC: 4.48 10*6/uL (ref 3.80–5.10)
RDW: 11.6 % (ref 11.0–15.0)
Total Lymphocyte: 27.8 %
WBC: 6 10*3/uL (ref 3.8–10.8)

## 2020-04-09 LAB — COMPREHENSIVE METABOLIC PANEL
AG Ratio: 2.1 (calc) (ref 1.0–2.5)
ALT: 17 U/L (ref 6–29)
AST: 20 U/L (ref 10–35)
Albumin: 4.4 g/dL (ref 3.6–5.1)
Alkaline phosphatase (APISO): 96 U/L (ref 37–153)
BUN/Creatinine Ratio: 26 (calc) — ABNORMAL HIGH (ref 6–22)
BUN: 15 mg/dL (ref 7–25)
CO2: 30 mmol/L (ref 20–32)
Calcium: 9.7 mg/dL (ref 8.6–10.4)
Chloride: 102 mmol/L (ref 98–110)
Creat: 0.58 mg/dL — ABNORMAL LOW (ref 0.60–0.88)
Globulin: 2.1 g/dL (calc) (ref 1.9–3.7)
Glucose, Bld: 94 mg/dL (ref 65–99)
Potassium: 4.2 mmol/L (ref 3.5–5.3)
Sodium: 140 mmol/L (ref 135–146)
Total Bilirubin: 0.8 mg/dL (ref 0.2–1.2)
Total Protein: 6.5 g/dL (ref 6.1–8.1)

## 2020-04-09 LAB — VITAMIN D 25 HYDROXY (VIT D DEFICIENCY, FRACTURES): Vit D, 25-Hydroxy: 34 ng/mL (ref 30–100)

## 2020-04-09 LAB — TSH: TSH: 1.11 mIU/L (ref 0.40–4.50)

## 2020-04-09 LAB — VITAMIN B12: Vitamin B-12: 480 pg/mL (ref 200–1100)

## 2020-04-15 ENCOUNTER — Telehealth: Payer: Self-pay

## 2020-04-15 NOTE — Telephone Encounter (Signed)
Patient assistance form for Eliquis 5 mg BID faxed to [employer] patient assistance foundation.  Awaiting approval.

## 2020-04-19 ENCOUNTER — Encounter: Payer: Self-pay | Admitting: Cardiology

## 2020-04-19 DIAGNOSIS — I10 Essential (primary) hypertension: Secondary | ICD-10-CM | POA: Insufficient documentation

## 2020-04-19 DIAGNOSIS — Z8673 Personal history of transient ischemic attack (TIA), and cerebral infarction without residual deficits: Secondary | ICD-10-CM | POA: Insufficient documentation

## 2020-04-21 NOTE — Telephone Encounter (Signed)
Patient called in regards to note below. I advised her that form were sent on 10/7 and we are awaiting approval.

## 2020-04-21 NOTE — Telephone Encounter (Signed)
Spoke with pt and informed we received a fax from [facility] Patient [facility] stating pt is not eligible to receive assistance. Pt is required to show proof of spending at least 3% of household income on medications.   Pt notified and voiced she will drop off information at front desk for nurse to fax.

## 2020-04-26 ENCOUNTER — Other Ambulatory Visit: Payer: Self-pay

## 2020-04-26 ENCOUNTER — Encounter: Payer: Self-pay | Admitting: Cardiology

## 2020-04-26 ENCOUNTER — Ambulatory Visit (INDEPENDENT_AMBULATORY_CARE_PROVIDER_SITE_OTHER): Payer: Medicare Other | Admitting: Cardiology

## 2020-04-26 ENCOUNTER — Encounter: Payer: Self-pay | Admitting: *Deleted

## 2020-04-26 VITALS — BP 102/64 | HR 63 | Ht 67.0 in | Wt 137.2 lb

## 2020-04-26 DIAGNOSIS — I480 Paroxysmal atrial fibrillation: Secondary | ICD-10-CM | POA: Diagnosis not present

## 2020-04-26 DIAGNOSIS — I25119 Atherosclerotic heart disease of native coronary artery with unspecified angina pectoris: Secondary | ICD-10-CM

## 2020-04-26 DIAGNOSIS — Z8673 Personal history of transient ischemic attack (TIA), and cerebral infarction without residual deficits: Secondary | ICD-10-CM | POA: Diagnosis not present

## 2020-04-26 MED ORDER — METOPROLOL TARTRATE 25 MG PO TABS: 37.5000 mg | ORAL_TABLET | Freq: Two times a day (BID) | ORAL | 3 refills | Status: DC

## 2020-04-26 NOTE — Progress Notes (Signed)
Patient ID: Brooke [name], female   DOB: Jul 28, 1934, 85 y.o.   MRN: [id number] Patient enrolled for [employer] to ship a 14 day ZIO XT long term holter monitor to her home.

## 2020-04-26 NOTE — Patient Instructions (Addendum)
Medication Instructions:  Your physician has recommended you make the following change in your medication:   1.  STOP taking flecainide.  2.  INCREASE your metoprolol tartrate 25 mg-  Take 1.5 tablets (37.5 mg) by mouth twice a day  Lab Work: None ordered. If you have labs (blood work) drawn today and your tests are completely normal, you will receive your results only by: Marland Kitchen MyChart Message (if you have MyChart) OR . A paper copy in the mail If you have any lab test that is abnormal or we need to change your treatment, we will call you to review the results.  Testing/Procedures: Your physician has recommended that you wear a holter monitor. Holter monitors are medical devices that record the heart's electrical activity. Doctors most often use these monitors to diagnose arrhythmias. Arrhythmias are problems with the speed or rhythm of the heartbeat. The monitor is a small, portable device. You can wear one while you do your normal daily activities. This is usually used to diagnose what is causing palpitations/syncope (passing out).  14 day ZIO monitor  Follow-Up: At [facility], you and your health needs are our priority.  As part of our continuing mission to provide you with exceptional heart care, we have created designated Provider Care Teams.  These Care Teams include your primary Cardiologist (physician) and Advanced Practice Providers (APPs -  Physician Assistants and Nurse Practitioners) who all work together to provide you with the care you need, when you need it.  Your next appointment:   Your physician wants you to follow-up in: 3 months with Dr. Lalla Brothers.     July 22, 2019 at 2:30 pm  ZIO XT- Long Term Monitor Instructions   Your physician has requested you wear your ZIO patch monitor__14__days.   This is a single patch monitor.  [employer] supplies one patch monitor per enrollment.  Additional stickers are not available.   Please do not apply patch if you will be having  a Nuclear Stress Test, Echocardiogram, Cardiac CT, MRI, or Chest Xray during the time frame you would be wearing the monitor. The patch cannot be worn during these tests.  You cannot remove and re-apply the ZIO XT patch monitor.   Your ZIO patch monitor will be sent USPS Priority mail from [facility] directly to your home address. The monitor may also be mailed to a PO BOX if home delivery is not available.   It may take 3-5 days to receive your monitor after you have been enrolled.   Once you have received you monitor, please review enclosed instructions.  Your monitor has already been registered assigning a specific monitor serial # to you.   Applying the monitor   Shave hair from upper left chest.   Hold abrader disc by orange tab.  Rub abrader in 40 strokes over left upper chest as indicated in your monitor instructions.   Clean area with 4 enclosed alcohol pads .  Use all pads to assure are is cleaned thoroughly.  Let dry.   Apply patch as indicated in monitor instructions.  Patch will be place under collarbone on left side of chest with arrow pointing upward.   Rub patch adhesive wings for 2 minutes.Remove white label marked "1".  Remove white label marked "2".  Rub patch adhesive wings for 2 additional minutes.   While looking in a mirror, press and release button in center of patch.  A small green light will flash 3-4 times .  This will be your  only indicator the monitor has been turned on.     Do not shower for the first 24 hours.  You may shower after the first 24 hours.   Press button if you feel a symptom. You will hear a small click.  Record Date, Time and Symptom in the Patient Log Book.   When you are ready to remove patch, follow instructions on last 2 pages of Patient Log Book.  Stick patch monitor onto last page of Patient Log Book.   Place Patient Log Book in [place] box.  Use locking tab on box and tape box closed securely.  The [employer] has [employer] on it.  Please place in mailbox as soon as possible.  Your physician should have your test results approximately 7 days after the monitor has been mailed back to [facility].   Call [facility] Customer Care at [phone] if you have questions regarding your ZIO XT patch monitor.  Call them immediately if you see an orange light blinking on your monitor.   If your monitor falls off in less than 4 days contact our Monitor department at [phone].  If your monitor becomes loose or falls off after 4 days call [employer] at [phone] for suggestions on securing your monitor.

## 2020-04-26 NOTE — Progress Notes (Signed)
Electrophysiology Office Note:    Date:  04/26/2020   ID:  Brooke [name], DOB Nov 14, 1934, MRN [id number]  PCP:  Philip Aspen, Limmie Patricia, MD  [facility] Cardiologist:  Jodelle Red, MD  [facility] Electrophysiologist:  Lanier Prude, MD   Referring MD: Jodelle Red,*   Chief Complaint: Tachycardia  History of Present Illness:    Brooke [name] is a 85 y.o. female who presents for an evaluation of tachycardia at the request of Dr. Cristal Deer. Their medical history includes atrial fibrillation with prior ablations in November 2019 in November 2020, TIA, hyperlipidemia, coronary artery disease, hypertension.  For her atrial fibrillation and atrial flutter, she has been trialed on dofetilide which did not control her atrial fibrillation and led to postconversion pauses.  She is also taken amiodarone which based on chart review has caused worsened palpitations.  She tells me today that since her most recent ablation in 2020 she has not had palpitations that she correlates with atrial fibrillation.  She is however experiencing brief episodes (10 to 20 minutes at a time) of a elevated heart rate.  These are distinctly different from her previous atrial fibrillation episodes.  Exertion seems to bring up these episodes as does stress.  No associated syncope, presyncope.  She has previously taken flecainide which controlled these palpitations but recently she has had several physicians advised her to stop the flecainide given her recently discovered coronary artery disease.  In addition these palpitations, she is experienced generalized fatigue.  Despite this advice, she is continue to use flecainide.  Past Medical History:  Diagnosis Date   Atrial fibrillation (HCC)    CAD (coronary artery disease)    Glaucoma    Hyperlipidemia    OA (osteoarthritis) of hip     History reviewed. No pertinent surgical history.  Current Medications: Current Meds  Medication Sig    acetaminophen (TYLENOL) 325 MG tablet Take by mouth.   ALPRAZolam (XANAX) 0.5 MG tablet Take 0.5 mg by mouth 2 (two) times daily as needed.    amLODipine (NORVASC) 5 MG tablet Take by mouth.   apixaban (ELIQUIS) 5 MG TABS tablet Take 1 tablet by mouth 2 (two) times daily.   aspirin 81 MG EC tablet Take by mouth.   atorvastatin (LIPITOR) 40 MG tablet Take by mouth.   Cholecalciferol 25 MCG (1000 UT) tablet Take by mouth.   latanoprost (XALATAN) 0.005 % ophthalmic solution INSTILL 1 DROP INTO BOTH  EYES AT BEDTIME   losartan (COZAAR) 50 MG tablet Take 1 tablet by mouth daily.    OXYGEN Inhale 2 L into the lungs.   sertraline (ZOLOFT) 25 MG tablet Take 1 tablet (25 mg total) by mouth daily.   [DISCONTINUED] Aspirin Buf,CaCarb-MgCarb-MgO, 81 MG TABS Take by mouth.   [DISCONTINUED] Calcium Carbonate-Vitamin D3 600-400 MG-UNIT TABS Take 3 tablets by mouth daily.   [DISCONTINUED] flecainide (TAMBOCOR) 50 MG tablet Take by mouth.   [DISCONTINUED] metoprolol tartrate (LOPRESSOR) 25 MG tablet Take 1 tablet by mouth 2 (two) times daily.      Allergies:   Hydrocodone-acetaminophen   Social History   Socioeconomic History   Marital status: Married    Spouse name: Not on file   Number of children: Not on file   Years of education: Not on file   Highest education level: Not on file  Occupational History   Not on file  Tobacco Use   Smoking status: Never Smoker   Smokeless tobacco: Never Used  Substance and Sexual Activity  Alcohol use: Never   Drug use: Never   Sexual activity: Not on file  Other Topics Concern   Not on file  Social History Narrative   Not on file   Social Determinants of Health   Financial Resource Strain:    Difficulty of Paying Living Expenses: Not on file  Food Insecurity:    Worried About Running Out of Food in the Last Year: Not on file   Ran Out of Food in the Last Year: Not on file  Transportation Needs:    Lack of  Transportation (Medical): Not on file   Lack of Transportation (Non-Medical): Not on file  Physical Activity:    Days of Exercise per Week: Not on file   Minutes of Exercise per Session: Not on file  Stress:    Feeling of Stress : Not on file  Social Connections:    Frequency of Communication with Friends and Family: Not on file   Frequency of Social Gatherings with Friends and Family: Not on file   Attends Religious Services: Not on file   Active Member of Clubs or Organizations: Not on file   Attends Banker Meetings: Not on file   Marital Status: Not on file     Family History: The patient's family history includes Aortic stenosis in her sister; CAD in her father; CVA in her mother.  ROS:   Please see the history of present illness.    All other systems reviewed and are negative.  EKGs/Labs/Other Studies Reviewed:    The following studies were reviewed today: Prior records, echo  March 12, 2020 echo personally reviewed Left ventricular function is normal  right ventricular function is normal Mild to moderate tricuspid regurgitation, mild MR Left atrial dilation   EKG:  The ekg ordered today demonstrates sinus rhythm  Recent Labs: 04/08/2020: ALT 17; BUN 15; Creat 0.58; Hemoglobin 14.3; Platelets 215; Potassium 4.2; Sodium 140; TSH 1.11  Recent Lipid Panel No results found for: CHOL, TRIG, HDL, CHOLHDL, VLDL, LDLCALC, LDLDIRECT  Physical Exam:    VS:  BP 102/64    Pulse 63    Ht 5\' 7"  (1.702 m)    Wt 137 lb 3.2 oz (62.2 kg)    SpO2 98%    BMI 21.49 kg/m     Wt Readings from Last 3 Encounters:  04/26/20 137 lb 3.2 oz (62.2 kg)  04/08/20 133 lb (60.3 kg)  03/23/20 134 lb (60.8 kg)     GEN:  Well nourished, well developed in no acute distress HEENT: Normal NECK: No JVD; No carotid bruits LYMPHATICS: No lymphadenopathy CARDIAC: RRR, no murmurs, rubs, gallops RESPIRATORY:  Clear to auscultation without rales, wheezing or rhonchi    ABDOMEN: Soft, non-tender, non-distended MUSCULOSKELETAL:  No edema; No deformity  SKIN: Warm and dry NEUROLOGIC:  Alert and oriented x 3 PSYCHIATRIC:  Normal affect   ASSESSMENT:    1. Paroxysmal atrial fibrillation (HCC)   2. Coronary artery disease involving native coronary artery of native heart with angina pectoris (HCC)   3. History of TIA (transient ischemic attack)    PLAN:    In order of problems listed above:  1. Paroxysmal atrial fibrillation On Eliquis and tolerating well.  Has previously used Tikosyn and amiodarone which she did not tolerate.  Flecainide has worked well to control most recent palpitations but this is contraindicated in the setting of coronary artery disease.  I discussed the options for pharmacologic suppression of atrial tachycardia and atrial fibrillation including a class  III agent such as sotalol versus up titration of her metoprolol.  Given the infrequent nature of the episodes I recommended that we start with increasing her metoprolol to 37.5 mg twice daily.  We will stop the flecainide given the coronary artery disease.  She is agreed to do that today.  We will place a ZIO monitor for 14 days to quantify and formally assess what exactly is causing the palpitations she is experiencing (atrial tachycardia versus atrial fibrillation).  We will plan to see back in clinic in 6 to 8 weeks.  2.  Coronary artery disease No ischemic symptoms today.  Continue aspirin, metoprolol, atorvastatin. This precludes use of 1C agents  3.  History of TIA Continue Eliquis   Medication Adjustments/Labs and Tests Ordered: Current medicines are reviewed at length with the patient today.  Concerns regarding medicines are outlined above.  Orders Placed This Encounter  Procedures   LONG TERM MONITOR (3-14 DAYS)   EKG 12-Lead   Meds ordered this encounter  Medications   metoprolol tartrate (LOPRESSOR) 25 MG tablet    Sig: Take 1.5 tablets (37.5 mg total) by mouth 2  (two) times daily.    Dispense:  270 tablet    Refill:  3     Signed, Steffanie Dunn, MD, [facility]  04/26/2020 1:34 PM    Electrophysiology [facility]

## 2020-04-30 ENCOUNTER — Other Ambulatory Visit: Payer: Self-pay | Admitting: *Deleted

## 2020-04-30 MED ORDER — ALPRAZOLAM 0.5 MG PO TABS
0.5000 mg | ORAL_TABLET | Freq: Two times a day (BID) | ORAL | 0 refills | Status: DC | PRN
Start: 2020-04-30 — End: 2020-06-16

## 2020-04-30 NOTE — Telephone Encounter (Signed)
Refill request for  ALPRAZolam (XANAX) 0.5 MG tablet [facility]

## 2020-05-01 ENCOUNTER — Ambulatory Visit (INDEPENDENT_AMBULATORY_CARE_PROVIDER_SITE_OTHER): Payer: Medicare Other

## 2020-05-01 DIAGNOSIS — I480 Paroxysmal atrial fibrillation: Secondary | ICD-10-CM

## 2020-05-14 ENCOUNTER — Other Ambulatory Visit: Payer: Self-pay | Admitting: *Deleted

## 2020-05-14 MED ORDER — APIXABAN 5 MG PO TABS
5.0000 mg | ORAL_TABLET | Freq: Two times a day (BID) | ORAL | 0 refills | Status: DC
Start: 2020-05-14 — End: 2020-10-01

## 2020-05-14 NOTE — Telephone Encounter (Signed)
[facility] Refill request apixaban (ELIQUIS) 5 MG TABS tablet  Never filled by Dr Janeece Riggers to fill?

## 2020-05-26 ENCOUNTER — Emergency Department (HOSPITAL_COMMUNITY): Payer: Medicare Other

## 2020-05-26 ENCOUNTER — Telehealth: Payer: Self-pay | Admitting: Internal Medicine

## 2020-05-26 ENCOUNTER — Emergency Department (HOSPITAL_COMMUNITY)
Admission: EM | Admit: 2020-05-26 | Discharge: 2020-05-26 | Disposition: A | Discharge status: Home / Self Care | Payer: Medicare Other | Attending: Emergency Medicine | Admitting: Emergency Medicine

## 2020-05-26 ENCOUNTER — Encounter (HOSPITAL_COMMUNITY): Payer: Self-pay | Admitting: *Deleted

## 2020-05-26 ENCOUNTER — Other Ambulatory Visit: Payer: Self-pay

## 2020-05-26 DIAGNOSIS — I471 Supraventricular tachycardia: Secondary | ICD-10-CM | POA: Diagnosis not present

## 2020-05-26 DIAGNOSIS — R000 Tachycardia, unspecified: Secondary | ICD-10-CM | POA: Diagnosis present

## 2020-05-26 DIAGNOSIS — Z7982 Long term (current) use of aspirin: Secondary | ICD-10-CM | POA: Diagnosis not present

## 2020-05-26 DIAGNOSIS — I480 Paroxysmal atrial fibrillation: Secondary | ICD-10-CM | POA: Insufficient documentation

## 2020-05-26 DIAGNOSIS — I2510 Atherosclerotic heart disease of native coronary artery without angina pectoris: Secondary | ICD-10-CM | POA: Diagnosis not present

## 2020-05-26 DIAGNOSIS — Z79899 Other long term (current) drug therapy: Secondary | ICD-10-CM | POA: Diagnosis not present

## 2020-05-26 DIAGNOSIS — Z7901 Long term (current) use of anticoagulants: Secondary | ICD-10-CM | POA: Insufficient documentation

## 2020-05-26 DIAGNOSIS — R55 Syncope and collapse: Secondary | ICD-10-CM | POA: Insufficient documentation

## 2020-05-26 LAB — BASIC METABOLIC PANEL
Anion gap: 6 (ref 5–15)
BUN: 15 mg/dL (ref 8–23)
CO2: 31 mmol/L (ref 22–32)
Calcium: 9.4 mg/dL (ref 8.9–10.3)
Chloride: 102 mmol/L (ref 98–111)
Creatinine, Ser: 0.51 mg/dL (ref 0.44–1.00)
GFR, Estimated: >60 mL/min (ref >60)
Glucose, Bld: 104 mg/dL — ABNORMAL HIGH (ref 70–99)
Potassium: 3.9 mmol/L (ref 3.5–5.1)
Sodium: 139 mmol/L (ref 135–145)

## 2020-05-26 LAB — CBC WITH DIFFERENTIAL/PLATELET
Abs Immature Granulocytes: 0.01 10*3/uL (ref 0.00–0.07)
Basophils Absolute: 0 10*3/uL (ref 0.0–0.1)
Basophils Relative: 1 %
Eosinophils Absolute: 0.2 10*3/uL (ref 0.0–0.5)
Eosinophils Relative: 3 %
HCT: 45.5 % (ref 36.0–46.0)
Hemoglobin: 15 g/dL (ref 12.0–15.0)
Immature Granulocytes: 0 %
Lymphocytes Relative: 26 %
Lymphs Abs: 1.5 10*3/uL (ref 0.7–4.0)
MCH: 32.4 pg (ref 26.0–34.0)
MCHC: 33 g/dL (ref 30.0–36.0)
MCV: 98.3 fL (ref 80.0–100.0)
Monocytes Absolute: 0.6 10*3/uL (ref 0.1–1.0)
Monocytes Relative: 11 %
Neutro Abs: 3.3 10*3/uL (ref 1.7–7.7)
Neutrophils Relative %: 59 %
Platelets: 213 10*3/uL (ref 150–400)
RBC: 4.63 MIL/uL (ref 3.87–5.11)
RDW: 12.1 % (ref 11.5–15.5)
WBC: 5.6 10*3/uL (ref 4.0–10.5)
nRBC: 0 % (ref 0.0–0.2)

## 2020-05-26 LAB — MAGNESIUM: Magnesium: 2.4 mg/dL (ref 1.7–2.4)

## 2020-05-26 IMAGING — DX DG CHEST 1V PORT
1 series · 1 of 1 positions shown · non-contrast
Comparison: None.

CLINICAL DATA: Palpitations

EXAM:
PORTABLE CHEST 1 VIEW

[chest ap]
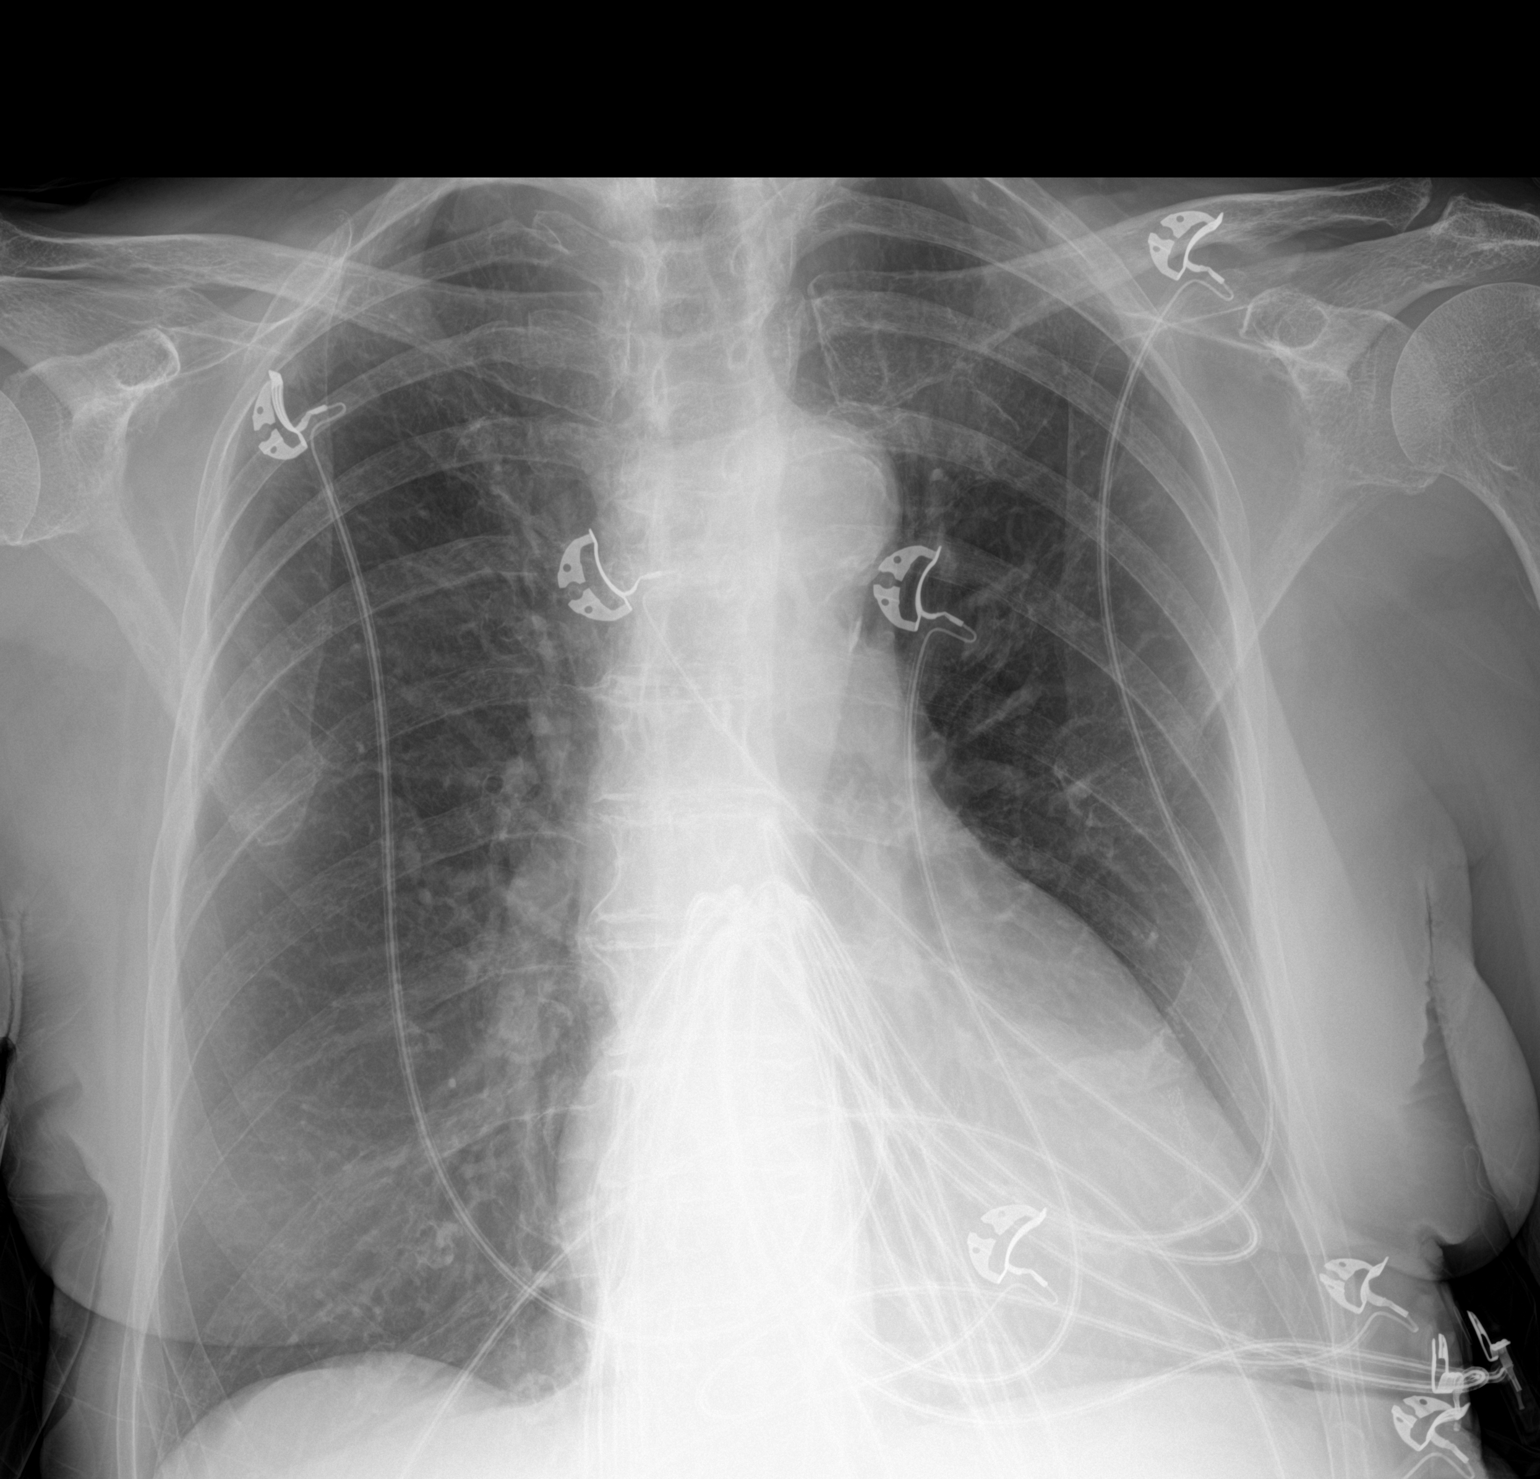

[1 of 1 positions shown; findings below may reference images not displayed]

FINDINGS: Heart size is mildly enlarged. Atherosclerotic calcification of the
aortic knob. Hyperinflated lungs. No focal airspace consolidation,
pleural effusion, or pneumothorax.
IMPRESSION: 1. Mild cardiomegaly without acute cardiopulmonary finding.
2. Hyperinflated lungs, suggesting COPD.

## 2020-05-26 MED ORDER — METOPROLOL TARTRATE 50 MG PO TABS: 50.0000 mg | ORAL_TABLET | Freq: Two times a day (BID) | ORAL | 0 refills | Status: DC

## 2020-05-26 NOTE — Discharge Instructions (Signed)
You were evaluated in the emergency department for rapid heart rate and some lightheadedness.  You were in normal sinus rhythm here.  Your lab work was unremarkable.  Cardiology is recommending that you increase your metoprolol to 50 mg twice a day.  Please follow-up with them as scheduled on Friday.  Return to the emergency department for any worsening or concerning symptoms.

## 2020-05-26 NOTE — ED Triage Notes (Addendum)
She reports palpitations and tachycardia this morning. Her blood pressure monitor read a heart rate of 164 this morning. She felt light headed and like she was going to faint. History of a-fib. She reports she was taken off flecainide a few weeks ago and increased her metoprolol.

## 2020-05-26 NOTE — ED Provider Notes (Signed)
[facility]-EMERGENCY DEPT Provider Note   CSN: [id number] Arrival date & time: 05/26/20  1120     History Chief Complaint  Patient presents with  . Tachycardia  . Palpitations    Brooke [name] is a 85 y.o. female.  She has a history of paroxysmal A. fib and has had ablations.  She continues to have paroxysmal A. fib.  She said this morning she felt lightheaded like she was going to faint and her heart was racing.  Her blood pressure monitor read a heart rate of 164.  This lasted about 30 minutes.  She says her usual episodes usually only last for 5 or 10 minutes.  She does not feel like her heart is racing now.  There was no loss of consciousness.  The history is provided by the patient.  Palpitations Palpitations quality:  Fast Onset quality:  Sudden Duration:  30 minutes Progression:  Resolved Chronicity:  Recurrent Relieved by:  None tried Worsened by:  Nothing Ineffective treatments:  None tried Associated symptoms: dizziness, malaise/fatigue and near-syncope   Associated symptoms: no chest pain, no chest pressure, no cough, no diaphoresis, no hemoptysis, no lower extremity edema, no shortness of breath, no syncope and no vomiting   Risk factors: hx of atrial fibrillation        Past Medical History:  Diagnosis Date  . Atrial fibrillation (HCC)   . CAD (coronary artery disease)   . Glaucoma   . Hyperlipidemia   . OA (osteoarthritis) of hip     Patient Active Problem List   Diagnosis Date Noted  . History of TIA (transient ischemic attack) 04/19/2020  . Essential hypertension 04/19/2020  . CAD (coronary artery disease)   . Atrial fibrillation (HCC)   . Hyperlipidemia   . Glaucoma   . OA (osteoarthritis) of hip   . 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 No pertinent surgical history.   OB History   No obstetric history on file.     Family History  Problem Relation Age of Onset  . CVA Mother   . CAD Father   . Aortic  stenosis Sister     Social History   Tobacco Use  . Smoking status: Never Smoker  . Smokeless tobacco: Never Used  Substance Use Topics  . Alcohol use: Never  . Drug use: Never    Home Medications Prior to Admission medications   Medication Sig Start Date End Date Taking? Authorizing Provider  acetaminophen (TYLENOL) 325 MG tablet Take by mouth. 05/08/14   [provider]  ALPRAZolam Prudy Feeler) 0.5 MG tablet Take 1 tablet (0.5 mg total) by mouth 2 (two) times daily as needed. 04/30/20   Philip Aspen, Limmie Patricia, MD  amLODipine (NORVASC) 5 MG tablet Take by mouth. 12/15/19   [provider]  apixaban (ELIQUIS) 5 MG TABS tablet Take 1 tablet (5 mg total) by mouth 2 (two) times daily. 05/14/20   Philip Aspen, Limmie Patricia, MD  aspirin 81 MG EC tablet Take by mouth. 11/24/16   [provider]  atorvastatin (LIPITOR) 40 MG tablet Take by mouth. 11/23/16   [provider]  Cholecalciferol 25 MCG (1000 UT) tablet Take by mouth.    [provider]  latanoprost (XALATAN) 0.005 % ophthalmic solution INSTILL 1 DROP INTO BOTH  EYES AT BEDTIME 03/30/20   Philip Aspen, Limmie Patricia, MD  losartan (COZAAR) 50 MG tablet Take 1 tablet by mouth daily.  09/03/19   [provider]  metoprolol  tartrate (LOPRESSOR) 25 MG tablet Take 1.5 tablets (37.5 mg total) by mouth 2 (two) times daily. 04/26/20 07/25/20  Lanier Prude, MD  OXYGEN Inhale 2 L into the lungs.    [provider]  sertraline (ZOLOFT) 25 MG tablet Take 1 tablet (25 mg total) by mouth daily. 04/08/20   Philip Aspen, Limmie Patricia, MD    Allergies    Hydrocodone-acetaminophen  Review of Systems   Review of Systems  Constitutional: Positive for malaise/fatigue. Negative for diaphoresis and fever.  HENT: Negative for sore throat.   Eyes: Negative for visual disturbance.  Respiratory: Negative for cough, hemoptysis and shortness of breath.   Cardiovascular: Positive for palpitations  and near-syncope. Negative for chest pain and syncope.  Gastrointestinal: Negative for abdominal pain and vomiting.  Genitourinary: Negative for dysuria.  Musculoskeletal: Negative for neck pain.  Skin: Negative for rash.  Neurological: Positive for dizziness.    Physical Exam Updated Vital Signs BP (!) 177/114   Pulse 88   Temp 98.1 F (36.7 C) (Oral)   Resp 18   SpO2 100%   Physical Exam Vitals and nursing note reviewed.  Constitutional:      General: She is not in acute distress.    Appearance: Normal appearance. She is well-developed.  HENT:     Head: Normocephalic and atraumatic.  Eyes:     Conjunctiva/sclera: Conjunctivae normal.  Cardiovascular:     Rate and Rhythm: Normal rate and regular rhythm.     Pulses: Normal pulses.     Heart sounds: No murmur heard.   Pulmonary:     Effort: Pulmonary effort is normal. No respiratory distress.     Breath sounds: Normal breath sounds.  Abdominal:     Palpations: Abdomen is soft.     Tenderness: There is no abdominal tenderness.  Musculoskeletal:        General: No deformity or signs of injury. Normal range of motion.     Cervical back: Neck supple.     Right lower leg: No edema.     Left lower leg: No edema.  Skin:    General: Skin is warm and dry.     Capillary Refill: Capillary refill takes less than 2 seconds.  Neurological:     General: No focal deficit present.     Mental Status: She is alert.     ED Results / Procedures / Treatments   Labs (all labs ordered are listed, but only abnormal results are displayed) Labs Reviewed  BASIC METABOLIC PANEL - Abnormal; Notable for the following components:      Result Value   Glucose, Bld 104 (*)    All other components within normal limits  CBC WITH DIFFERENTIAL/PLATELET  MAGNESIUM    EKG EKG Interpretation  Date/Time:  Wednesday May 26 2020 11:34:43 EST Ventricular Rate:  80 PR Interval:    QRS Duration: 83 QT Interval:  379 QTC Calculation: 438 R  Axis:   90 Text Interpretation: Sinus rhythm Probable left atrial enlargement Anteroseptal infarct, age indeterminate No old tracing to compare Confirmed by Meridee Score [phone]) on 05/26/2020 12:33:26 PM   Radiology DG Chest Port 1 View  Result Date: 05/26/2020 CLINICAL DATA:  Palpitations EXAM: PORTABLE CHEST 1 VIEW COMPARISON:  None. FINDINGS: Heart size is mildly enlarged. Atherosclerotic calcification of the aortic knob. Hyperinflated lungs. No focal airspace consolidation, pleural effusion, or pneumothorax. IMPRESSION: 1. Mild cardiomegaly without acute cardiopulmonary finding. 2. Hyperinflated lungs, suggesting COPD. Electronically Signed   By: Duanne Guess D.O.  On: 05/26/2020 13:07    Procedures Procedures (including critical care time)  Medications Ordered in ED Medications - No data to display  ED Course  I have reviewed the triage vital signs and the nursing notes.  Pertinent labs & imaging results that were available during my care of the patient were reviewed by me and considered in my medical decision making (see chart for details).  Clinical Course as of May 26 1854  Wed May 26, 2020  1514 Reached out to EP cardiology and they reviewed her case with Dr. Lalla Brothers.  They are recommending increased her beta-blocker to 50.   [MB]    Clinical Course User Index [MB] Terrilee Files, MD   MDM Rules/Calculators/A&P                         This patient complains of rapid heart rate, lightheadedness; this involves an extensive number of treatment Options and is a complaint that carries with it a high risk of complications and Morbidity. The differential includes ACS, arrhythmia, PE, pneumothorax, metabolic derangement  I ordered, reviewed and interpreted labs, which included CBC with normal white count normal hemoglobin, chemistries normal other than mild elevated glucose likely reactive, normal museum I ordered medication none I ordered imaging studies which  included chest x-ray and I independently    visualized and interpreted imaging which showed no acute pulmonary disease Additional history obtained from patient's husband Previous records obtained and reviewed in epic including prior electrophysiology visits I consulted electrophysiology cardiology and discussed lab and imaging findings  Critical Interventions: None  After the interventions stated above, I reevaluated the patient and found patient to be currently asymptomatic.  Her prior cardiac rhythm monitoring showed multiple episodes of atrial tachycardia with rates as high as 170s.  Cardiology is recommending increase beta-blocker.  Patient comfortable plan and has cardiology follow-up in 2 days.  Return instructions discussed.   Final Clinical Impression(s) / ED Diagnoses Final diagnoses:  Atrial tachycardia (HCC)    Rx / DC Orders ED Discharge Orders         Ordered    metoprolol tartrate (LOPRESSOR) 50 MG tablet  2 times daily        05/26/20 1516           Terrilee Files, MD 05/26/20 1857

## 2020-05-26 NOTE — Telephone Encounter (Signed)
[facility] [employer] called with patient to make an appointment for BP fluctuations.  I attempted to scheule patient an appoint at 3:45 with Dr. Clent Ridges.  Patient stated she couldn't wait that long, that she felt too bad and was going to ER.

## 2020-05-27 NOTE — Telephone Encounter (Signed)
Patient assistance for Eliquis 5 mg BID has been approved from [employer] through 07/09/20.  Attempted to contact pt. Left message to call back.

## 2020-05-28 ENCOUNTER — Encounter: Payer: Self-pay | Admitting: Cardiology

## 2020-05-28 ENCOUNTER — Other Ambulatory Visit: Payer: Self-pay

## 2020-05-28 ENCOUNTER — Ambulatory Visit (INDEPENDENT_AMBULATORY_CARE_PROVIDER_SITE_OTHER): Payer: Medicare Other | Admitting: Cardiology

## 2020-05-28 VITALS — BP 112/67 | HR 71 | Ht 66.0 in | Wt 135.2 lb

## 2020-05-28 DIAGNOSIS — I25119 Atherosclerotic heart disease of native coronary artery with unspecified angina pectoris: Secondary | ICD-10-CM

## 2020-05-28 DIAGNOSIS — R5383 Other fatigue: Secondary | ICD-10-CM | POA: Diagnosis not present

## 2020-05-28 DIAGNOSIS — R0600 Dyspnea, unspecified: Secondary | ICD-10-CM | POA: Diagnosis not present

## 2020-05-28 DIAGNOSIS — I480 Paroxysmal atrial fibrillation: Secondary | ICD-10-CM | POA: Diagnosis not present

## 2020-05-28 DIAGNOSIS — R0609 Other forms of dyspnea: Secondary | ICD-10-CM

## 2020-05-28 DIAGNOSIS — I10 Essential (primary) hypertension: Secondary | ICD-10-CM

## 2020-05-28 NOTE — Progress Notes (Signed)
Cardiology Office Note:    Date:  05/28/2020   ID:  Brooke [name], DOB 06-Sep-1934, MRN [id number]  PCP:  Brooke [name], Brooke PatriciaEstela Y, MD  Cardiologist:  Brooke RedBridgette Shakinah Navis, MD  Referring MD: Brooke [name], Estel*   CC: follow up  History of Present Illness:    Brooke [name] is a 85 [name].o. female with a hx of CAD, hyperlipidemia, atrial fibrillation, atrial flutter, paroxysmal atrial tachycardia, TIA, hypertension who is seen for follow up today. I initially saw her 02/23/20 as a new consult at the request of Brooke [name], Almira Barstel* for the evaluation and management of increasing fatigue.  Note from 02/18/20 reviewed. Initial visit with Dr. Flonnie HailstoneHernadez [name], had moved from [place] to [place], prior records requested. Noted to have prior history of CAD with stent placement (noted as recent), atrial fibrillation with multiple past ablations. Noted to have increasing fatigue.  Able to access note from 09/22/19 from [facility] Weber [name]. Noted cardiac history summarized as below: CTI Flutter ablation 05/13/2018 Trialed on tikosyn, did not control her her atrial fibrillation. Noted to have conversion pauses. PVI/SVT ablation 05/16/2019 Atrial tachycardia noted post ablation 05/2019, had EP study On flecainide for brief paroxysmal atrial tachycardia seen on monitor. Amiodarone caused her to have palpitations  Today: Saw Dr. Lalla [name] 04/26/20. Stopped flecainide, uptitrated metoprolol. Seen in the ER 05/26/20 for atrial tachycardia. Noted to have heart rates in the 160s for about 30 minutes, had resolved on arrival to the ER. Recommended to uptitrate metoprolol further.  Has felt rough overall. Just started increased dose of metoprolol after ER visit 11/17. Had another episode of atach yesterday, lasted about half an hour. She feels this is triggered by stress, even small amounts.   Had questions about whether a pacemaker would help her. We discussed indications for a pacemaker.  Denies  chest pain, shortness of breath at rest or with normal exertion. No PND, orthopnea, LE edema or unexpected weight gain. No syncope, rare lightheadedness.  Past Medical History:  Diagnosis Date  . Atrial fibrillation (HCC)   . CAD (coronary artery disease)   . Glaucoma   . Hyperlipidemia   . OA (osteoarthritis) of hip     No past surgical history on file.  Current Medications: Current Outpatient Medications on File Prior to Visit  Medication Sig  . acetaminophen (TYLENOL) 325 MG tablet Take 650 mg by mouth every 6 (six) hours as needed for mild pain.   Marland Kitchen. ALPRAZolam (XANAX) 0.5 MG tablet Take 1 tablet (0.5 mg total) by mouth 2 (two) times daily as needed. (Patient taking differently: Take 0.5 mg by mouth 2 (two) times daily as needed for anxiety. )  . amLODipine (NORVASC) 5 MG tablet Take 5 mg by mouth daily.   Marland Kitchen. apixaban (ELIQUIS) 5 MG TABS tablet Take 1 tablet (5 mg total) by mouth 2 (two) times daily.  Marland Kitchen. aspirin 81 MG EC tablet Take 81 mg by mouth daily.   Marland Kitchen. atorvastatin (LIPITOR) 40 MG tablet Take 40 mg by mouth daily.   . carboxymethylcellul-glycerin (OPTIVE) 0.5-0.9 % ophthalmic solution Place 1 drop into both eyes 3 (three) times daily as needed for dry eyes.  . Cholecalciferol 25 MCG (1000 UT) tablet Take 1,000 Units by mouth daily.   . hydrocortisone cream 1 % Apply 1 application topically daily as needed for itching.  . latanoprost (XALATAN) 0.005 % ophthalmic solution INSTILL 1 DROP INTO BOTH  EYES AT BEDTIME (Patient taking differently: Place 1 drop into both eyes at bedtime. INSTILL 1  DROP INTO BOTH  EYES AT BEDTIME)  . losartan (COZAAR) 50 MG tablet Take 50 mg by mouth daily.   . metoprolol tartrate (LOPRESSOR) 50 MG tablet Take 1 tablet (50 mg total) by mouth 2 (two) times daily.  . OXYGEN Inhale 2 L into the lungs at bedtime.    No current facility-administered medications on file prior to visit.     Allergies:   Hydrocodone-acetaminophen   Social History   Tobacco  Use  . Smoking status: Never Smoker  . Smokeless tobacco: Never Used  Substance Use Topics  . Alcohol use: Never  . Drug use: Never    Family History: family history includes Aortic stenosis in her sister; CAD in her father; CVA in her mother.  ROS:   Please see the history of present illness.  Additional pertinent ROS otherwise unremarkable.  EKGs/Labs/Other Studies Reviewed:    The following studies were reviewed today: Echo 03/12/20 1. Left ventricular ejection fraction, by estimation, is 60 to 65%. The  left ventricle has normal function. The left ventricle has no regional  wall motion abnormalities. Left ventricular diastolic parameters are  consistent with Grade III diastolic  dysfunction (restrictive). Elevated left atrial pressure.  2. Right ventricular systolic function is normal. The right ventricular  size is normal. There is normal pulmonary artery systolic pressure.  3. Left atrial size was mildly dilated.  4. The mitral valve is normal in structure. Mild mitral valve  regurgitation. No evidence of mitral stenosis.  5. Tricuspid valve regurgitation is mild to moderate.  6. The aortic valve is tricuspid. Aortic valve regurgitation is mild.  Mild aortic valve sclerosis is present, with no evidence of aortic valve  stenosis.  7. The inferior vena cava is normal in size with greater than 50%  respiratory variability, suggesting right atrial pressure of 3 mmHg.  Stress test 03/23/20  Nuclear stress EF: 64%.  The left ventricular ejection fraction is normal (55-65%).  There was no ST segment deviation noted during stress.  The study is normal.  This is a low risk study.   Normal stress nuclear study with no ischemia or infarction; gated EF 64% with normal wall motion.  Monitor 05/25/20 HR 52-197, average 71. Rare ventricular ectopy. 309 SVT episodes, longest lasting 16.9 seconds. Review of strips of SVT suggests AT.  Echo 05/10/2018 (care  everywhere) Interpretation Summary  A complete two-dimensional transthoracic echocardiogram with color flow  Doppler and spectral Doppler was performed. The left ventricle is grossly  normal size.  There is proximal septal thickening of the elderly.  The left ventricular ejection fraction is normal (65-70%).  The left ventricular wall motion is normal.  The aortic valve is trileaflet.  Mild aortic sclerosis is present with good valvular opening.  There is mild [1+] aortic regurgitation present.  Unable to adequately determine diastolic dysfunction.  There is moderate (2+) tricuspid regurgitation.  Right ventricular systolic pressure is normal.  There is mild (1+) mitral regurgitation.  The left atrium is moderately dilated.  The right atrium is moderately dilated.   Cardiac cath 11/23/2016 (care everywhere) PRESSURE DETERMINATIONS: Aortic pressure was 172/76 mmHg with a mean  pressure 114 mmHg. Left ankle pressure was 172/18 mmHg. There was no  pressure gradient noted on pullback acrossthe aortic valve.   CORONARY ANGIOGRAPHY: The coronary angiograms are technically  satisfactory.   LEFT MAIN: Left main coronary artery has luminal irregularities.   LEFT ANTERIOR DESCENDING: Left anterior descending coronary artery has  mild proximal to mid ectasia with  proximal 30% narrowing. The vessel is  moderate in size and extends transapically. First, second, and third  diagonal branches are small with luminal irregularities. There are  sequential obstructive narrowings in the mid to distal vessel with maximal  narrowing of 80% to 90%.   CIRCUMFLEX: The circumflex system gives rise to a moderate size first  obtuse marginal branch with luminal irregularities. Second obtuse  marginal branch has critical 90% proximal narrowing. The distal  circumflex gives rise to a small to moderate size branch, which supplies a  posterolateral territory and has luminal irregularities.    RIGHT CORONARY ARTERY: The right coronary artery is a small caliber  codominant vessel with luminal irregularities.   CORONARY INTERVENTION: Following angiography, we proceeded with  intervention for the critical obstructive narrowings within the left  anterior descending coronary artery followed by the circumflex second  obtuse marginal branch.The patient received a weight-based Angiomax  bolus followed by an Angiomax infusion. She also received 180 mg loading  dose of Brilinta.   The ostium of the left main was engaged with a 6-French XB 3.5 guiding  catheter. The area of culprit disease in the left anterior descending  coronary artery was crossed without difficulty with a 0.014-inch 190 cm  long BMW wire. Predilation was performed with a 2.0 x 12 mm Sprinter  Legend balloon inflated to 10 atmospheres for 20 seconds. We then  administered 200 mcg of intracoronary nitroglycerin. The stenosis was  stented with a 2.25 x 22 mm medicated Resolute Onyx stent deployed at 12  atmospheres for 30 seconds. The proximal and mid portion of the stent  were postdilated with a 2.5 x 15 mm [place] Trek balloon inflated to 12  atmospheres for 20 seconds.   Imaging following removal of intracoronary guidewire reveals an acceptable  result. Preprocedure stenosis was maximally 90%, postprocedure residual  was 0%. Pre- and post-procedure TIMI flow are grade 3. This was a type C  lesion.   INTERVENTION OF THE CIRCUMFLEX SECOND OBTUSE MARGINAL BRANCH: We then  directed the BMW wire down the second obtuse marginal branch.   Pre-dilatation was performed with a 2.0 x 12 mm Sprinter Legend balloon  inflated to 10 atmospheres for 20 seconds. We then stented the stenosis  with a 2.5 x 15 mm medicated Resolute Onyx stent, which was deployed at 12  atmospheres for 20 seconds.   Imaging following removal of intracoronary guidewire reveals an acceptable  result. Preprocedure stenosis was 90%,  postprocedure residual was 0%.   Pre- and post-procedure TIMI flow are grade 3. This was a type C lesion.   FINAL DIAGNOSES:  1. Critical obstructive narrowing of the mid to distal left anterior  descending coronary artery, status post stenting with placement of a  medicated stent, with acceptable results.  2. Obstructive narrowing second obtuse marginal branch, status post  stenting with placement of a medicated stent with acceptable results.   RECOMMENDATIONS: Continued post-procedure care with dual antiplatelet  therapy.   EKG:  EKG is personally reviewed.  The ekg ordered 05/26/20 demonstrates NSR at 80 bpm  Recent Labs: 04/08/2020: ALT 17; TSH 1.11 05/26/2020: BUN 15; Creatinine, Ser 0.51; Hemoglobin 15.0; Magnesium 2.4; Platelets 213; Potassium 3.9; Sodium 139  Recent Lipid Panel No results found for: CHOL, TRIG, HDL, CHOLHDL, VLDL, LDLCALC, LDLDIRECT  Physical Exam:    VS:  BP 112/67   Pulse 71   Ht  (1.676 m)   Wt 135 lb 3.2 oz (61.3 kg)   SpO2 99%  BMI 21.82 kg/m     Wt Readings from Last 3 Encounters:  05/28/20 135 lb 3.2 oz (61.3 kg)  05/26/20 135 lb (61.2 kg)  04/26/20 137 lb 3.2 oz (62.2 kg)    GEN: Well nourished, well developed in no acute distress HEENT: Normal, moist mucous membranes NECK: No JVD CARDIAC: regular rhythm, normal S1 and S2, no rubs or gallops. No murmur. VASCULAR: Radial and DP pulses 2+ bilaterally. No carotid bruits RESPIRATORY:  Clear to auscultation without rales, wheezing or rhonchi  ABDOMEN: Soft, non-tender, non-distended MUSCULOSKELETAL:  Ambulates independently SKIN: Warm and dry, no edema NEUROLOGIC:  Alert and oriented x 3. No focal neuro deficits noted. PSYCHIATRIC:  Normal affect   ASSESSMENT:    1. Fatigue, unspecified type   2. Nocturnal dyspnea   3. DOE (dyspnea on exertion)   4. Coronary artery disease involving native coronary artery of native heart with angina pectoris (HCC)   5. Paroxysmal atrial  fibrillation (HCC)   6. Essential hypertension    PLAN:    Fatigue, dyspnea on exertion, known history of CAD with prior stents: had never had traditional angina, concern this is an anginal equivalent -lexiscan myoview and echo unremarkable.  -recent ER visit with unrevealing workup -will order sleep study given shortness of breath at night and fatigue  Atrial fibrillation, atrial flutter, atrial tachycardia: extensive history, see summary -followed by Dr. Lalla Brothers -now off flecainide given history of CAD -intermittent symptoms, has upcoming follow up to discuss options -CHA2DS2/VAS Stroke Risk Points=5  -continue apixaban, metoprolol  History of TIA Hyperlipidemia CAD -continue atorvastatin 40 mg daily -no aspirin as she is on apixaban  Hypertension: -continue losartan  Cardiac risk counseling and prevention recommendations: -recommend heart healthy/Mediterranean diet, with whole grains, fruits, vegetable, fish, lean meats, nuts, and olive oil. Limit salt. -recommend moderate walking, 3-5 times/week for 30-50 minutes each session. Aim for at least 150 minutes.week. Goal should be pace of 3 miles/hours, or walking 1.5 miles in 30 minutes -recommend avoidance of tobacco products. Avoid excess alcohol.  Plan for follow up: 6 mos or sooner as needed  Brooke Red, MD, PhD [place]  [facility]    Medication Adjustments/Labs and Tests Ordered: Current medicines are reviewed at length with the patient today.  Concerns regarding medicines are outlined above.  Orders Placed This Encounter  Procedures  . Home sleep test   No orders of the defined types were placed in this encounter.   Patient Instructions  Medication Instructions:  Your Physician recommend you continue on your current medication as directed.    *If you need a refill on your cardiac medications before your next appointment, please call your pharmacy*   Lab  Work: None   Testing/Procedures: Your physician has recommended that you have a sleep study. This test records several body functions during sleep, including: brain activity, eye movement, oxygen and carbon dioxide blood levels, heart rate and rhythm, breathing rate and rhythm, the flow of air through your mouth and nose, snoring, body muscle movements, and chest and belly movement. Someone will call to schedule sleep study  Follow-Up: At [facility], you and your health needs are our priority.  As part of our continuing mission to provide you with exceptional heart care, we have created designated Provider Care Teams.  These Care Teams include your primary Cardiologist (physician) and Advanced Practice Providers (APPs -  Physician Assistants and Nurse Practitioners) who all work together to provide you with the care you need, when you need it.  We recommend signing up for the patient portal called "MyChart".  Sign up information is provided on this After Visit Summary.  MyChart is used to connect with patients for Virtual Visits (Telemedicine).  Patients are able to view lab/test results, encounter notes, upcoming appointments, etc.  Non-urgent messages can be sent to your provider as well.   To learn more about what you can do with MyChart, go to [email or web address].    Your next appointment:   6 month(s)  The format for your next appointment:   In Person  Provider:   Jodelle Red, MD   Other Instructions If it is a question about your heart rhythm, you can call Dr. Lovena Neighbours office for help. If it is any other heart concern I'd be happy to help.   Signed, Brooke Red, MD PhD 05/28/2020  [facility]

## 2020-05-28 NOTE — Patient Instructions (Addendum)
Medication Instructions:  Your Physician recommend you continue on your current medication as directed.    *If you need a refill on your cardiac medications before your next appointment, please call your pharmacy*   Lab Work: None   Testing/Procedures: Your physician has recommended that you have a sleep study. This test records several body functions during sleep, including: brain activity, eye movement, oxygen and carbon dioxide blood levels, heart rate and rhythm, breathing rate and rhythm, the flow of air through your mouth and nose, snoring, body muscle movements, and chest and belly movement. Someone will call to schedule sleep study  Follow-Up: At [facility], you and your health needs are our priority.  As part of our continuing mission to provide you with exceptional heart care, we have created designated Provider Care Teams.  These Care Teams include your primary Cardiologist (physician) and Advanced Practice Providers (APPs -  Physician Assistants and Nurse Practitioners) who all work together to provide you with the care you need, when you need it.  We recommend signing up for the patient portal called "MyChart".  Sign up information is provided on this After Visit Summary.  MyChart is used to connect with patients for Virtual Visits (Telemedicine).  Patients are able to view lab/test results, encounter notes, upcoming appointments, etc.  Non-urgent messages can be sent to your provider as well.   To learn more about what you can do with MyChart, go to [email or web address].    Your next appointment:   6 month(s)  The format for your next appointment:   In Person  Provider:   Jodelle Red, MD   Other Instructions If it is a question about your heart rhythm, you can call Dr. Lovena Neighbours office for help. If it is any other heart concern I'd be happy to help.

## 2020-06-08 ENCOUNTER — Telehealth: Payer: Self-pay | Admitting: Cardiology

## 2020-06-08 NOTE — Telephone Encounter (Signed)
Pt is calling to report that her palpitations have not improved since increasing her metoprolol to 50 mg twice a day. She is also experiencing greater episodes of dizziness which occasionally precede her bouts of palpitations. She states her heart rate goes up over 100 bpm on a daily basis, and episodes can last from 15 minutes to an hour. She states she is not currently experiencing palpitations at this time. She denies chest pain, shortness of breath, or loss of consciousness. She states she has been staying well hydrated and changes positions slowly. When she feels dizzy, she sits down to rest and occasionally puts her head between her knees, which alleviates the dizziness.   Recent BP/HR readings: 06/08/20: BP- 118/68  HR-82 06/07/20: BP- 126/65  HR-76 06/06/20: BP- 130/78  HR-70 06/05/20: BP- 138/99  HR-158 06/05/20: BP- 106/66  HR-64  The pt stats she generally avoids checking her HR during episodes of palpitations because it causes her anxiety and "makes it worse." She also states that she has noticed more issues after coming of flecainide in October.  Advised pt that we would reach out to MD and then get back to her. The patient verbalizes understanding and agreement with plan.

## 2020-06-08 NOTE — Telephone Encounter (Signed)
Pt c/o medication issue:  1. Name of Medication: metoprolol tartrate (LOPRESSOR) 50 MG tablet  2. How are you currently taking this medication (dosage and times per day)? 1 tablet (50 mg total) by mouth 2 (two) times daily  3. Are you having a reaction (difficulty breathing--STAT)? No   4. What is your medication issue? Patient states she has been taking the medication since 05/26/20 and it is not helping with her palpitations. She states she also gets lightheaded sometimes when taking the medication- patient denies dizziness.   Patient c/o Palpitations:  High priority if patient c/o lightheadedness, shortness of breath, or chest pain  1) How long have you had palpitations/irregular HR/ Afib? Are you having the symptoms now? Patient states she has been having palpitations on and off for over 1 month and she is is having the palpitations right now  2) Are you currently experiencing lightheadedness, SOB or CP? No   3) Do you have a history of afib (atrial fibrillation) or irregular heart rhythm? Yes  4) Have you checked your BP or HR? (document readings if available):  06/08/20: BP- 118/68 HR-82 06/07/20: BP- 126/65 HR-76 06/06/20: BP- 130/78 HR-70 06/05/20: BP- 138/99 HR-158 06/05/20: BP- 106/66 HR-64 5) Are you experiencing any other symptoms? Fatigue

## 2020-06-09 NOTE — Telephone Encounter (Signed)
The blood pressure numbers are overall acceptable--none too low or too high. With the palpitations, I know that the flecainide worked, but this is a high risk medication for you. I'll send this note on to Dr. Lalla Brothers as well to see if he has suggestions prior to your return visit.

## 2020-06-10 ENCOUNTER — Telehealth: Payer: Self-pay | Admitting: Cardiology

## 2020-06-10 NOTE — Telephone Encounter (Signed)
Pt rescheduled for 12/8 to meet with Dr. Lalla Brothers to discuss possible medication changes.

## 2020-06-10 NOTE — Telephone Encounter (Signed)
Pt made aware of Dr. Cristal Deer and Dr. Lalla Brothers recommendations. Pt verbalized understanding.

## 2020-06-10 NOTE — Telephone Encounter (Signed)
    Pt would like to speak with a nurse to talk about her medications

## 2020-06-10 NOTE — Telephone Encounter (Signed)
Left message to call back  

## 2020-06-10 NOTE — Telephone Encounter (Signed)
Patient returning call.

## 2020-06-10 NOTE — Telephone Encounter (Signed)
Returned call to pt she states that Dr Lalla Brothers stopped her flecainide in October, and today is pt is experiencing intermittant tachycardia. She states that this has been happening on and off since this has been stopped. She has "doubled up on the metoprolol since stopping the flecainide. She states that sometime she feels like she will pass out but not today. She is asking if there is something else that she can take to help her. Will forward to [facility]-Dr Lalla Brothers for further instruction.

## 2020-06-16 ENCOUNTER — Encounter: Payer: Self-pay | Admitting: Cardiology

## 2020-06-16 ENCOUNTER — Ambulatory Visit (INDEPENDENT_AMBULATORY_CARE_PROVIDER_SITE_OTHER): Payer: Medicare Other | Admitting: Cardiology

## 2020-06-16 ENCOUNTER — Other Ambulatory Visit: Payer: Self-pay

## 2020-06-16 VITALS — BP 104/70 | HR 74 | Ht 66.0 in | Wt 137.0 lb

## 2020-06-16 DIAGNOSIS — I480 Paroxysmal atrial fibrillation: Secondary | ICD-10-CM | POA: Diagnosis not present

## 2020-06-16 DIAGNOSIS — I10 Essential (primary) hypertension: Secondary | ICD-10-CM

## 2020-06-16 DIAGNOSIS — I471 Supraventricular tachycardia: Secondary | ICD-10-CM | POA: Diagnosis not present

## 2020-06-16 DIAGNOSIS — I25119 Atherosclerotic heart disease of native coronary artery with unspecified angina pectoris: Secondary | ICD-10-CM | POA: Diagnosis not present

## 2020-06-16 MED ORDER — AMIODARONE HCL 200 MG PO TABS: ORAL_TABLET | ORAL | 3 refills | Status: DC

## 2020-06-16 NOTE — Progress Notes (Signed)
Electrophysiology Office Follow up Visit Note:    Date:  06/16/2020   ID:  Brooke [name], DOB February 13, 1935, MRN [id number]  PCP:  Philip Aspen, Limmie Patricia, MD  [facility] Cardiologist:  Jodelle Red, MD  [facility] Electrophysiologist:  Lanier Prude, MD    Interval History:    Brooke [name] is a 85 y.o. female who presents for a follow up visit after I previously saw her on April 26, 2020 for paroxysmal atrial tachycardia.  At that appointment we increased the metoprolol she was taking to help control these episodes.  She was previously taking flecainide but this was stopped given significant coronary artery disease.  She is previously tried and failed amiodarone and dofetilide.  Today in clinic we spent a great deal of time going through each of the antiarrhythmic she had previously taken.  She had previously taken sotalol while she was cared for in [place].  Unfortunately sotalol caused hair loss and generalized malaise so this was stopped.  Dofetilide had previously been used but was ineffective in maintaining normal rhythm.  Amiodarone was used but she does not personally recall what led to this medication being stopped.  Upon chart review in care everywhere it looks like amiodarone was stopped after the patient reported an increased burden of palpitations while taking the medication.  I do not see any evidence of any monitors, rhythm strips, EKGs that demonstrate increased A. fib, atrial tachycardia or other arrhythmia while taking amiodarone.   Past Medical History:  Diagnosis Date  . Atrial fibrillation (HCC)   . CAD (coronary artery disease)   . Glaucoma   . Hyperlipidemia   . OA (osteoarthritis) of hip     History reviewed. No pertinent surgical history.  Current Medications: Current Meds  Medication Sig  . acetaminophen (TYLENOL) 325 MG tablet Take 650 mg by mouth every 6 (six) hours as needed for mild pain.   Marland Kitchen ALPRAZolam (XANAX) 0.5 MG tablet Take 0.5  mg by mouth 2 (two) times daily.  Marland Kitchen amLODipine (NORVASC) 5 MG tablet Take 5 mg by mouth daily.   Marland Kitchen apixaban (ELIQUIS) 5 MG TABS tablet Take 1 tablet (5 mg total) by mouth 2 (two) times daily.  Marland Kitchen aspirin 81 MG EC tablet Take 81 mg by mouth daily.   Marland Kitchen atorvastatin (LIPITOR) 40 MG tablet Take 40 mg by mouth daily.   . carboxymethylcellul-glycerin (OPTIVE) 0.5-0.9 % ophthalmic solution Place 1 drop into both eyes 3 (three) times daily as needed for dry eyes.  . Cholecalciferol 25 MCG (1000 UT) tablet Take 1,000 Units by mouth daily.   . hydrocortisone cream 1 % Apply 1 application topically daily as needed for itching.  . latanoprost (XALATAN) 0.005 % ophthalmic solution INSTILL 1 DROP INTO BOTH  EYES AT BEDTIME (Patient taking differently: Place 1 drop into both eyes at bedtime. INSTILL 1 DROP INTO BOTH  EYES AT BEDTIME)  . losartan (COZAAR) 50 MG tablet Take 50 mg by mouth daily.   . metoprolol tartrate (LOPRESSOR) 50 MG tablet Take 1 tablet (50 mg total) by mouth 2 (two) times daily.  . OXYGEN Inhale 2 L into the lungs at bedtime.      Allergies:   Hydrocodone-acetaminophen   Social History   Socioeconomic History  . Marital status: Married    Spouse name: Not on file  . Number of children: Not on file  . Years of education: Not on file  . Highest education level: Not on file  Occupational History  .  Not on file  Tobacco Use  . Smoking status: Never Smoker  . Smokeless tobacco: Never Used  Substance and Sexual Activity  . Alcohol use: Never  . Drug use: Never  . Sexual activity: Not on file  Other Topics Concern  . Not on file  Social History Narrative  . Not on file   Social Determinants of Health   Financial Resource Strain:   . Difficulty of Paying Living Expenses: Not on file  Food Insecurity:   . Worried About Programme researcher, broadcasting/film/video in the Last Year: Not on file  . Ran Out of Food in the Last Year: Not on file  Transportation Needs:   . Lack of Transportation (Medical):  Not on file  . Lack of Transportation (Non-Medical): Not on file  Physical Activity:   . Days of Exercise per Week: Not on file  . Minutes of Exercise per Session: Not on file  Stress:   . Feeling of Stress : Not on file  Social Connections:   . Frequency of Communication with Friends and Family: Not on file  . Frequency of Social Gatherings with Friends and Family: Not on file  . Attends Religious Services: Not on file  . Active Member of Clubs or Organizations: Not on file  . Attends Banker Meetings: Not on file  . Marital Status: Not on file     Family History: The patient's family history includes Aortic stenosis in her sister; CAD in her father; CVA in her mother.  ROS:   Please see the history of present illness.    All other systems reviewed and are negative.  EKGs/Labs/Other Studies Reviewed:    The following studies were reviewed today: ZIO, prior notes  EKG:  The ekg ordered today demonstrates sinus rhythm.  QTc 429 ms.  Ventricular rate 73 bpm.  No PACs or PVCs on today's EKG.  May 24, 2020 ZIO personally reviewed HR 52-197, average 71. Rare ventricular ectopy. 309 SVT episodes, longest lasting 16.9 seconds. Review of strips of SVT suggests AT.   Recent Labs: 04/08/2020: ALT 17; TSH 1.11 05/26/2020: BUN 15; Creatinine, Ser 0.51; Hemoglobin 15.0; Magnesium 2.4; Platelets 213; Potassium 3.9; Sodium 139  Recent Lipid Panel No results found for: CHOL, TRIG, HDL, CHOLHDL, VLDL, LDLCALC, LDLDIRECT  Physical Exam:    VS:  BP 104/70   Pulse 74   Ht 5\' 6"  (1.676 m)   Wt 137 lb (62.1 kg)   SpO2 99%   BMI 22.11 kg/m     Wt Readings from Last 3 Encounters:  06/16/20 137 lb (62.1 kg)  05/28/20 135 lb 3.2 oz (61.3 kg)  05/26/20 135 lb (61.2 kg)     GEN:  Well nourished, well developed in no acute distress HEENT: Normal NECK: No JVD; No carotid bruits LYMPHATICS: No lymphadenopathy CARDIAC: RRR, no murmurs, rubs, gallops RESPIRATORY:   Clear to auscultation without rales, wheezing or rhonchi  ABDOMEN: Soft, non-tender, non-distended MUSCULOSKELETAL:  No edema; No deformity  SKIN: Warm and dry NEUROLOGIC:  Alert and oriented x 3 PSYCHIATRIC:  Normal affect   ASSESSMENT:    1. Atrial tachycardia (HCC)   2. Paroxysmal atrial fibrillation (HCC)   3. Essential hypertension   4. Coronary artery disease involving native coronary artery of native heart with angina pectoris (HCC)    PLAN:    In order of problems listed above:  1. Paroxysmal atrial tachycardia Very symptomatic.  Has failed sotalol and dofetilide in the past.  Not eligible for  class Ic agents given severe coronary artery disease requiring PCI.  We discussed how she has very limited options during her visit today.  We went back through the antiarrhythmics.  We discussed how the only antiarrhythmic were I do not have objective evidence of failure is amiodarone.  Also think this would be the easiest option for her to start in the outpatient setting.  After lengthy discussion, the patient decided to retry amiodarone to see if it could help control her paroxysmal atrial tachycardia.  We will start 200 mg twice daily for 10 days and then decrease it to 200 mg once daily.  I will plan on seeing her back in about 3 months to check blood work and assess the response to therapy.  We did discuss the risks of amiodarone therapy during today's visit including the risk of thyroid, lung, liver toxicity.  2.  Hypertension Under control on losartan, metoprolol, amlodipine.  3.  Coronary artery disease Continue aspirin, atorvastatin.  No ischemic symptoms during today's exam.   Medication Adjustments/Labs and Tests Ordered: Current medicines are reviewed at length with the patient today.  Concerns regarding medicines are outlined above.  Orders Placed This Encounter  Procedures  . EKG 12-Lead   Meds ordered this encounter  Medications  . DISCONTD: amiodarone (PACERONE) 200  MG tablet    Sig: Take 1 tablet (200 mg total) by mouth 2 (two) times daily for 10 days, THEN 1 tablet (200 mg total) daily.    Dispense:  40 tablet    Refill:  3  . amiodarone (PACERONE) 200 MG tablet    Sig: Take 1 tablet (200 mg total) by mouth 2 (two) times daily for 10 days, THEN 1 tablet (200 mg total) daily.    Dispense:  90 tablet    Refill:  3    Initial 30 day prescription sent to Pt's local pharmacy.     Signed, Steffanie Dunn, MD, [facility]  06/16/2020 5:24 PM    Electrophysiology [facility]

## 2020-06-16 NOTE — Patient Instructions (Addendum)
Medication Instructions:  Your physician has recommended you make the following change in your medication:   1.  START taking amiodarone 200 mg-  Take one tablet by mouth twice a day for 10 days. After 10 days REDUCE your amiodarone 200 mg to one tablet by mouth ONCE a day.   Labwork: None ordered.  Testing/Procedures: None ordered.  Follow-Up: Your physician wants you to follow-up in: 3 months with Dr. Lalla Brothers.    September 14, 2020 at 2:15 pm at the [facility] office   Any Other Special Instructions Will Be Listed Below (If Applicable).  If you need a refill on your cardiac medications before your next appointment, please call your pharmacy.   Amiodarone tablets What is this medicine? AMIODARONE (a MEE oh da rone) is an antiarrhythmic drug. It helps make your heart beat regularly. Because of the side effects caused by this medicine, it is only used when other medicines have not worked. It is usually used for heartbeat problems that may be life threatening. This medicine may be used for other purposes; ask your health care provider or pharmacist if you have questions. COMMON BRAND NAME(S): Cordarone, Pacerone What should I tell my health care provider before I take this medicine? They need to know if you have any of these conditions:  liver disease  lung disease  other heart problems  thyroid disease  an unusual or allergic reaction to amiodarone, iodine, other medicines, foods, dyes, or preservatives  pregnant or trying to get pregnant  breast-feeding How should I use this medicine? Take this medicine by mouth with a glass of water. Follow the directions on the prescription label. You can take this medicine with or without food. However, you should always take it the same way each time. Take your doses at regular intervals. Do not take your medicine more often than directed. Do not stop taking except on the advice of your doctor or health care professional. A special  MedGuide will be given to you by the pharmacist with each prescription and refill. Be sure to read this information carefully each time. Talk to your pediatrician regarding the use of this medicine in children. Special care may be needed. Overdosage: If you think you have taken too much of this medicine contact a poison control center or emergency room at once. NOTE: This medicine is only for you. Do not share this medicine with others. What if I miss a dose? If you miss a dose, take it as soon as you can. If it is almost time for your next dose, take only that dose. Do not take double or extra doses. What may interact with this medicine? Do not take this medicine with any of the following medications:  abarelix  apomorphine  arsenic trioxide  certain antibiotics like erythromycin, gemifloxacin, levofloxacin, pentamidine  certain medicines for depression like amoxapine, tricyclic antidepressants  certain medicines for fungal infections like fluconazole, itraconazole, ketoconazole, posaconazole, voriconazole  certain medicines for irregular heart beat like disopyramide, dronedarone, ibutilide, propafenone, sotalol  certain medicines for malaria like chloroquine, halofantrine  cisapride  droperidol  haloperidol  hawthorn  maprotiline  methadone  phenothiazines like chlorpromazine, mesoridazine, thioridazine  pimozide  ranolazine  red yeast rice  vardenafil This medicine may also interact with the following medications:  antiviral medicines for HIV or AIDS  certain medicines for blood pressure, heart disease, irregular heart beat  certain medicines for cholesterol like atorvastatin, cerivastatin, lovastatin, simvastatin  certain medicines for hepatitis C like sofosbuvir and ledipasvir; sofosbuvir  certain medicines for seizures like phenytoin  certain medicines for thyroid problems  certain medicines that treat or prevent blood clots like  warfarin  cholestyramine  cimetidine  clopidogrel  cyclosporine  dextromethorphan  diuretics  dofetilide  fentanyl  general anesthetics  grapefruit juice  lidocaine  loratadine  methotrexate  other medicines that prolong the QT interval (cause an abnormal heart rhythm)  procainamide  quinidine  rifabutin, rifampin, or rifapentine  St. John's Wort  trazodone  ziprasidone This list may not describe all possible interactions. Give your health care provider a list of all the medicines, herbs, non-prescription drugs, or dietary supplements you use. Also tell them if you smoke, drink alcohol, or use illegal drugs. Some items may interact with your medicine. What should I watch for while using this medicine? Your condition will be monitored closely when you first begin therapy. Often, this drug is first started in a hospital or other monitored health care setting. Once you are on maintenance therapy, visit your doctor or health care professional for regular checks on your progress. Because your condition and use of this medicine carry some risk, it is a good idea to carry an identification card, necklace or bracelet with details of your condition, medications, and doctor or health care professional. Bonita Quin may get drowsy or dizzy. Do not drive, use machinery, or do anything that needs mental alertness until you know how this medicine affects you. Do not stand or sit up quickly, especially if you are an older patient. This reduces the risk of dizzy or fainting spells. This medicine can make you more sensitive to the sun. Keep out of the sun. If you cannot avoid being in the sun, wear protective clothing and use sunscreen. Do not use sun lamps or tanning beds/booths. You should have regular eye exams before and during treatment. Call your doctor if you have blurred vision, see halos, or your eyes become sensitive to light. Your eyes may get dry. It may be helpful to use a  lubricating eye solution or artificial tears solution. If you are going to have surgery or a procedure that requires contrast dyes, tell your doctor or health care professional that you are taking this medicine. What side effects may I notice from receiving this medicine? Side effects that you should report to your doctor or health care professional as soon as possible:  allergic reactions like skin rash, itching or hives, swelling of the face, lips, or tongue  blue-gray coloring of the skin  blurred vision, seeing blue green halos, increased sensitivity of the eyes to light  breathing problems  chest pain  dark urine  fast, irregular heartbeat  feeling faint or light-headed  intolerance to heat or cold  nausea or vomiting  pain and swelling of the scrotum  pain, tingling, numbness in feet, hands  redness, blistering, peeling or loosening of the skin, including inside the mouth  spitting up blood  stomach pain  sweating  unusual or uncontrolled movements of body  unusually weak or tired  weight gain or loss  yellowing of the eyes or skin Side effects that usually do not require medical attention (report to your doctor or health care professional if they continue or are bothersome):  change in sex drive or performance  constipation  dizziness  headache  loss of appetite  trouble sleeping This list may not describe all possible side effects. Call your doctor for medical advice about side effects. You may report side effects to FDA at 1-800-FDA-1088. Where  should I keep my medicine? Keep out of the reach of children. Store at room temperature between 20 and 25 degrees C (68 and 77 degrees F). Protect from light. Keep container tightly closed. Throw away any unused medicine after the expiration date. NOTE: This sheet is a summary. It may not cover all possible information. If you have questions about this medicine, talk to your doctor, pharmacist, or health  care provider.  2020 Elsevier/Gold Standard (2018-05-29 13:44:04)

## 2020-06-18 ENCOUNTER — Other Ambulatory Visit: Payer: Self-pay | Admitting: Internal Medicine

## 2020-06-21 ENCOUNTER — Telehealth: Payer: Self-pay | Admitting: Cardiology

## 2020-06-21 NOTE — Telephone Encounter (Signed)
STAT if HR is under 50 or over 120 (normal HR is 60-100 beats per minute)  1) What is your heart rate? 59 this morning  2) Do you have a log of your heart rate readings (document readings)? 61, 158, 82, 111, 71, 76, 168, 159, 162, 78 was not sure what days the readings were, but they were not from today   3) Do you have any other symptoms? No  Patient states her her HR has been up and down and this morning it was very low. She states she is not having any other symptoms.

## 2020-06-21 NOTE — Telephone Encounter (Signed)
Call returned to Pt.  Pt concerned that her HR is 59.  She is not symptomatic.  Advised pt not to be concerned about this heart rate.  Advised to contact office if heart rate is 50 or lower or Pt feels dizzy, weak, presyncopal.  Pt indicates understanding.

## 2020-06-25 ENCOUNTER — Other Ambulatory Visit: Payer: Self-pay | Admitting: Internal Medicine

## 2020-06-25 MED ORDER — ALPRAZOLAM 0.5 MG PO TABS: 0.5000 mg | ORAL_TABLET | Freq: Every evening | ORAL | 1 refills | Status: DC | PRN

## 2020-06-25 NOTE — Telephone Encounter (Signed)
Looks like Steffanie Dunn T, MD added it to the medication list, but did not send in a prescription.

## 2020-06-25 NOTE — Telephone Encounter (Signed)
Pt call and want to know was her  ALPRAZolam Prudy Feeler) 0.5 MG tablet call in she want a call back.

## 2020-06-25 NOTE — Addendum Note (Signed)
Addended by: Kern Reap B on: 06/25/2020 03:59 PM   Modules accepted: Orders

## 2020-06-25 NOTE — Telephone Encounter (Signed)
Pt is calling in stating that she needs to have a emergency refill to go to her local pharmacy due to her not being able to get it until 07/02/2020 (Friday) from [facility].   Pharm:  [facility] [employer] [phone]

## 2020-06-25 NOTE — Telephone Encounter (Signed)
Dr Jerene Bears a refill to go to [facility].  Can you please send in a refill to her local pharmacy?

## 2020-06-28 ENCOUNTER — Telehealth: Payer: Self-pay | Admitting: Internal Medicine

## 2020-06-28 ENCOUNTER — Telehealth: Payer: Self-pay | Admitting: Cardiology

## 2020-06-28 NOTE — Telephone Encounter (Signed)
    Pt need refill for her xanax, advised need to call pcp to get emergency refill since she is out of meds, pt's pcp prescribed xanax. Pt understood

## 2020-06-28 NOTE — Telephone Encounter (Signed)
Patient is wanting to see if an emergency supply can be called in for her ALPRAZolam (XANAX) 0.5 MG tablet  To:  [id number] - [place], [place] - [address] AT [address] [facility] [address] Phone:  [phone]  Fax:  [phone]      She is completely out of this medication and don't know when she will receive her medication through mail order.  Please advise

## 2020-06-29 NOTE — Addendum Note (Signed)
Addended by: Weyman Croon E on: 06/29/2020 10:06 AM   Modules accepted: Orders

## 2020-06-30 MED ORDER — ALPRAZOLAM 0.5 MG PO TABS: 0.5000 mg | ORAL_TABLET | Freq: Every evening | ORAL | 0 refills | Status: DC | PRN

## 2020-07-01 ENCOUNTER — Other Ambulatory Visit: Payer: Self-pay

## 2020-07-01 MED ORDER — LOSARTAN POTASSIUM 50 MG PO TABS
50.0000 mg | ORAL_TABLET | Freq: Every day | ORAL | 3 refills | Status: DC
Start: 2020-07-01 — End: 2020-07-12

## 2020-07-01 NOTE — Telephone Encounter (Signed)
This is Dr. Christopher's pt 

## 2020-07-06 NOTE — Telephone Encounter (Signed)
Patient is calling back to check the status of medication refill for Alprazolam, please advise. CB is [phone]

## 2020-07-06 NOTE — Telephone Encounter (Signed)
I see a Rx from 12/22 by Dr. Clent Ridges.

## 2020-07-07 NOTE — Telephone Encounter (Signed)
Spoke with patient and she would like some changes to her medication.  An appointment is scheduled.

## 2020-07-12 ENCOUNTER — Other Ambulatory Visit: Payer: Self-pay

## 2020-07-12 MED ORDER — LOSARTAN POTASSIUM 50 MG PO TABS
50.0000 mg | ORAL_TABLET | Freq: Every day | ORAL | 0 refills | Status: DC
Start: 2020-07-12 — End: 2020-10-22

## 2020-07-13 ENCOUNTER — Ambulatory Visit: Payer: Self-pay | Admitting: Internal Medicine

## 2020-07-14 ENCOUNTER — Other Ambulatory Visit: Payer: Self-pay

## 2020-07-14 ENCOUNTER — Ambulatory Visit (INDEPENDENT_AMBULATORY_CARE_PROVIDER_SITE_OTHER): Payer: Medicare Other | Admitting: Internal Medicine

## 2020-07-14 ENCOUNTER — Encounter: Payer: Self-pay | Admitting: Internal Medicine

## 2020-07-14 VITALS — BP 120/70 | HR 77 | Temp 97.9°F | Wt 138.3 lb

## 2020-07-14 DIAGNOSIS — I10 Essential (primary) hypertension: Secondary | ICD-10-CM | POA: Diagnosis not present

## 2020-07-14 DIAGNOSIS — F411 Generalized anxiety disorder: Secondary | ICD-10-CM | POA: Diagnosis not present

## 2020-07-14 DIAGNOSIS — E785 Hyperlipidemia, unspecified: Secondary | ICD-10-CM

## 2020-07-14 MED ORDER — SERTRALINE HCL 50 MG PO TABS: 50.0000 mg | ORAL_TABLET | Freq: Every day | ORAL | 1 refills | Status: DC

## 2020-07-14 NOTE — Patient Instructions (Signed)
-  Nice seeing you today!!  -Start Zoloft 50 mg at bedtime.  -Take Xanax only once a day.  -Schedule follow up in 8 weeks.

## 2020-07-14 NOTE — Progress Notes (Signed)
Established Patient Office Visit     This visit occurred during the SARS-CoV-2 public health emergency.  Safety protocols were in place, including screening questions prior to the visit, additional usage of staff PPE, and extensive cleaning of exam room while observing appropriate contact time as indicated for disinfecting solutions.    CC/Reason for Visit: Discuss anxiety  HPI: Brooke [name] is a 86 y.o. female who is coming in today for the above mentioned reasons. Past Medical History is significant for: Fatigue, depression, coronary artery disease, atrial fibrillation status post ablation, hyperlipidemia, glaucoma, bilateral hip replacements.  She continues to complain of depressed mood and anxiety.  She has been prescribed Xanax 0.5 milligrams which she is supposed to be taking once daily but she started taking it twice daily due to increased anxiety symptoms.  She ran out over the holidays and noticed excessive shaking and tremendous increase in anxiety while out of her benzodiazepine.  She would like to discuss continued treatment for anxiety and depression.   Past Medical/Surgical History: Past Medical History:  Diagnosis Date  . Atrial fibrillation (HCC)   . CAD (coronary artery disease)   . Glaucoma   . Hyperlipidemia   . OA (osteoarthritis) of hip     No past surgical history on file.  Social History:  reports that she has never smoked. She has never used smokeless tobacco. She reports that she does not drink alcohol and does not use drugs.  Allergies: Allergies  Allergen Reactions  . Hydrocodone-Acetaminophen Anaphylaxis    Family History:  Family History  Problem Relation Age of Onset  . CVA Mother   . CAD Father   . Aortic stenosis Sister      Current Outpatient Medications:  .  acetaminophen (TYLENOL) 325 MG tablet, Take 650 mg by mouth every 6 (six) hours as needed for mild pain. , Disp: , Rfl:  .  ALPRAZolam (XANAX) 0.5 MG tablet, Take 1 tablet (0.5  mg total) by mouth at bedtime as needed for anxiety., Disp: 30 tablet, Rfl: 0 .  amiodarone (PACERONE) 200 MG tablet, Take 1 tablet (200 mg total) by mouth 2 (two) times daily for 10 days, THEN 1 tablet (200 mg total) daily., Disp: 90 tablet, Rfl: 3 .  amLODipine (NORVASC) 5 MG tablet, Take 5 mg by mouth daily. , Disp: , Rfl:  .  apixaban (ELIQUIS) 5 MG TABS tablet, Take 1 tablet (5 mg total) by mouth 2 (two) times daily., Disp: 180 tablet, Rfl: 0 .  aspirin 81 MG EC tablet, Take 81 mg by mouth daily. , Disp: , Rfl:  .  atorvastatin (LIPITOR) 40 MG tablet, Take 40 mg by mouth daily. , Disp: , Rfl:  .  carboxymethylcellul-glycerin (OPTIVE) 0.5-0.9 % ophthalmic solution, Place 1 drop into both eyes 3 (three) times daily as needed for dry eyes., Disp: , Rfl:  .  Cholecalciferol 25 MCG (1000 UT) tablet, Take 1,000 Units by mouth daily. , Disp: , Rfl:  .  hydrocortisone cream 1 %, Apply 1 application topically daily as needed for itching., Disp: , Rfl:  .  latanoprost (XALATAN) 0.005 % ophthalmic solution, INSTILL 1 DROP INTO BOTH  EYES AT BEDTIME (Patient taking differently: Place 1 drop into both eyes at bedtime. INSTILL 1 DROP INTO BOTH  EYES AT BEDTIME), Disp: 5 mL, Rfl: 5 .  losartan (COZAAR) 50 MG tablet, Take 1 tablet (50 mg total) by mouth daily., Disp: 90 tablet, Rfl: 0 .  metoprolol tartrate (LOPRESSOR) 50 MG  tablet, Take 1 tablet (50 mg total) by mouth 2 (two) times daily., Disp: 60 tablet, Rfl: 0 .  OXYGEN, Inhale 2 L into the lungs at bedtime. , Disp: , Rfl:  .  sertraline (ZOLOFT) 50 MG tablet, Take 1 tablet (50 mg total) by mouth daily., Disp: 90 tablet, Rfl: 1  Review of Systems:  Constitutional: Denies fever, chills, diaphoresis, appetite change and fatigue.  HEENT: Denies photophobia, eye pain, redness, hearing loss, ear pain, congestion, sore throat, rhinorrhea, sneezing, mouth sores, trouble swallowing, neck pain, neck stiffness and tinnitus.   Respiratory: Denies SOB, DOE, cough,  chest tightness,  and wheezing.   Cardiovascular: Denies chest pain, palpitations and leg swelling.  Gastrointestinal: Denies nausea, vomiting, abdominal pain, diarrhea, constipation, blood in stool and abdominal distention.  Genitourinary: Denies dysuria, urgency, frequency, hematuria, flank pain and difficulty urinating.  Endocrine: Denies: hot or cold intolerance, sweats, changes in hair or nails, polyuria, polydipsia. Musculoskeletal: Denies myalgias, back pain, joint swelling, arthralgias and gait problem.  Skin: Denies pallor, rash and wound.  Neurological: Denies dizziness, seizures, syncope, weakness, light-headedness, numbness and headaches.  Hematological: Denies adenopathy. Easy bruising, personal or family bleeding history  Psychiatric/Behavioral: Denies suicidal ideation,  confusion,  sleep disturbance and agitation    Physical Exam: Vitals:   07/14/20 1438  BP: 120/70  Pulse: 77  Temp: 97.9 F (36.6 C)  TempSrc: Oral  SpO2: 97%  Weight: 138 lb 4.8 oz (62.7 kg)    Body mass index is 22.32 kg/m.   Constitutional: NAD, calm, comfortable Eyes: PERRL, lids and conjunctivae normal ENMT: Mucous membranes are moist.  Neurologic: Grossly intact and nonfocal Psychiatric: Normal judgment and insight. Alert and oriented x 3. Normal mood.    Impression and Plan:  GAD (generalized anxiety disorder)   Flowsheet Row Office Visit from 07/14/2020 in [facility] [place]  PHQ-9 Total Score 9     -Start Zoloft 50 mg at bedtime. -May continue to take alprazolam once daily as needed. -She will return in 8 weeks for follow-up.  Essential hypertension -Well-controlled.  Hyperlipidemia, unspecified hyperlipidemia type -No recent lipids on file, she is on atorvastatin 40 mg daily.   Patient Instructions  -Nice seeing you today!!  -Start Zoloft 50 mg at bedtime.  -Take Xanax only once a day.  -Schedule follow up in 8 weeks.     Chaya Jan,  MD [facility]

## 2020-07-17 ENCOUNTER — Other Ambulatory Visit: Payer: Self-pay

## 2020-07-17 ENCOUNTER — Encounter (HOSPITAL_COMMUNITY): Payer: Self-pay | Admitting: Emergency Medicine

## 2020-07-17 ENCOUNTER — Emergency Department (HOSPITAL_COMMUNITY)
Admission: EM | Admit: 2020-07-17 | Discharge: 2020-07-18 | Disposition: A | Discharge status: Home / Self Care | Payer: Medicare Other | Attending: Emergency Medicine | Admitting: Emergency Medicine

## 2020-07-17 DIAGNOSIS — Z5321 Procedure and treatment not carried out due to patient leaving prior to being seen by health care provider: Secondary | ICD-10-CM | POA: Insufficient documentation

## 2020-07-17 DIAGNOSIS — R519 Headache, unspecified: Secondary | ICD-10-CM | POA: Insufficient documentation

## 2020-07-17 DIAGNOSIS — M79603 Pain in arm, unspecified: Secondary | ICD-10-CM | POA: Insufficient documentation

## 2020-07-17 DIAGNOSIS — R002 Palpitations: Secondary | ICD-10-CM | POA: Diagnosis present

## 2020-07-17 HISTORY — DX: Tachycardia, unspecified: R00.0

## 2020-07-17 LAB — CBC
HCT: 43.9 % (ref 36.0–46.0)
Hemoglobin: 14.6 g/dL (ref 12.0–15.0)
MCH: 31.9 pg (ref 26.0–34.0)
MCHC: 33.3 g/dL (ref 30.0–36.0)
MCV: 95.9 fL (ref 80.0–100.0)
Platelets: 204 10*3/uL (ref 150–400)
RBC: 4.58 MIL/uL (ref 3.87–5.11)
RDW: 12.2 % (ref 11.5–15.5)
WBC: 5.4 10*3/uL (ref 4.0–10.5)
nRBC: 0 % (ref 0.0–0.2)

## 2020-07-17 LAB — BASIC METABOLIC PANEL
Anion gap: 10 (ref 5–15)
BUN: 19 mg/dL (ref 8–23)
CO2: 26 mmol/L (ref 22–32)
Calcium: 9.3 mg/dL (ref 8.9–10.3)
Chloride: 102 mmol/L (ref 98–111)
Creatinine, Ser: 0.58 mg/dL (ref 0.44–1.00)
GFR, Estimated: >60 mL/min (ref >60)
Glucose, Bld: 119 mg/dL — ABNORMAL HIGH (ref 70–99)
Potassium: 4.9 mmol/L (ref 3.5–5.1)
Sodium: 138 mmol/L (ref 135–145)

## 2020-07-17 LAB — TROPONIN I (HIGH SENSITIVITY)
Troponin I (High Sensitivity): 5 ng/L (ref <18)
Troponin I (High Sensitivity): 6 ng/L (ref <18)

## 2020-07-17 NOTE — ED Triage Notes (Signed)
Pt to triage via [facility] from home.  Reports headache all day.  States she has been feeling nervous this afternoon and had bilateral arm pain.  Called EMS and reports heart racing that started after EMS arrived.  Denies chest pain and SOB.  CBG 128.  Hs of Afib RVR.  20g L FA.

## 2020-07-18 NOTE — ED Notes (Signed)
Pt left with husband.

## 2020-07-21 ENCOUNTER — Ambulatory Visit: Payer: Medicare Other | Admitting: Cardiology

## 2020-08-05 ENCOUNTER — Other Ambulatory Visit: Payer: Self-pay

## 2020-08-06 ENCOUNTER — Encounter: Payer: Self-pay | Admitting: Internal Medicine

## 2020-08-06 ENCOUNTER — Telehealth: Payer: Self-pay | Admitting: *Deleted

## 2020-08-06 ENCOUNTER — Ambulatory Visit (INDEPENDENT_AMBULATORY_CARE_PROVIDER_SITE_OTHER): Payer: Medicare Other | Admitting: Internal Medicine

## 2020-08-06 VITALS — BP 110/70 | HR 58 | Temp 97.9°F | Wt 140.1 lb

## 2020-08-06 DIAGNOSIS — R001 Bradycardia, unspecified: Secondary | ICD-10-CM | POA: Diagnosis not present

## 2020-08-06 DIAGNOSIS — R5383 Other fatigue: Secondary | ICD-10-CM | POA: Diagnosis not present

## 2020-08-06 DIAGNOSIS — I471 Supraventricular tachycardia: Secondary | ICD-10-CM

## 2020-08-06 DIAGNOSIS — R0600 Dyspnea, unspecified: Secondary | ICD-10-CM

## 2020-08-06 DIAGNOSIS — I25119 Atherosclerotic heart disease of native coronary artery with unspecified angina pectoris: Secondary | ICD-10-CM | POA: Diagnosis not present

## 2020-08-06 DIAGNOSIS — E785 Hyperlipidemia, unspecified: Secondary | ICD-10-CM

## 2020-08-06 DIAGNOSIS — R0609 Other forms of dyspnea: Secondary | ICD-10-CM

## 2020-08-06 DIAGNOSIS — F411 Generalized anxiety disorder: Secondary | ICD-10-CM

## 2020-08-06 MED ORDER — ALPRAZOLAM 0.5 MG PO TABS
0.5000 mg | ORAL_TABLET | Freq: Every evening | ORAL | 2 refills | Status: DC | PRN
Start: 2020-08-06 — End: 2020-09-10

## 2020-08-06 NOTE — Telephone Encounter (Signed)
Attempted to contact pt to scheduled appointment. Left message to call back.

## 2020-08-06 NOTE — Progress Notes (Signed)
Established Patient Office Visit     This visit occurred during the SARS-CoV-2 public health emergency.  Safety protocols were in place, including screening questions prior to the visit, additional usage of staff PPE, and extensive cleaning of exam room while observing appropriate contact time as indicated for disinfecting solutions.    CC/Reason for Visit: Fatigue and dyspnea on exertion, "I have a feeling of dread and impending doom"  HPI: Brooke [name] is a 86 y.o. female who is coming in today for the above mentioned reasons.  She has a history of coronary artery disease, hyperlipidemia, paroxysmal atrial tachycardia, possibly A. fib and hypertension.  I met her for the first time last fall as she had moved to our area from [place].  She has a history of coronary artery disease with stent placement but I do not have complete records for this.  Since I saw her last fall she had been complaining of excessive fatigue and dyspnea on exertion.  I sent her to see cardiology.  Dr. Harrell Gave agreed that this could possibly be an anginal equivalent.  She had an exercise treadmill stress test in September that was interpreted as normal.  She had a 2D echocardiogram that showed ejection fraction of 60 to 65% with grade 3 diastolic dysfunction.  She has seen Dr. Quentin Ore for her paroxysmal atrial tachycardia and has tried multiple medications but she has had issues tolerating.  The latest addition was amiodarone and she wonders if this is why she is progressively fatigued.  She tells me that it has reached a point where she will start doing her dishes and she has to stop because she gets so weak and short of breath.  It is concerning to me that in early January 2022 she visited the emergency department for what she described to me as left arm pain, a fast heartbeat, "I just felt bad", "I felt like I was going to die".  She had an EKG that was quite alarming that is in epic.  There appeared to be marked  inferior ST changes.  She had 2 troponins that day that were negative.  Unfortunately ED wait times were very long and she left without being seen by a physician.  Since that episode she has been back to her normal.  She describes a chronic and persistent, albeit progressive history of fatigue and dyspnea on exertion.  Throughout all of this she has denied chest pain.   Past Medical/Surgical History: Past Medical History:  Diagnosis Date  . Atrial fibrillation ([place])   . CAD (coronary artery disease)   . Glaucoma   . Hyperlipidemia   . OA (osteoarthritis) of hip   . Tachycardia     Past Surgical History:  Procedure Laterality Date  . ABLATION      Social History:  reports that she has never smoked. She has never used smokeless tobacco. She reports that she does not drink alcohol and does not use drugs.  Allergies: Allergies  Allergen Reactions  . Hydrocodone-Acetaminophen Anaphylaxis    Family History:  Family History  Problem Relation Age of Onset  . CVA Mother   . CAD Father   . Aortic stenosis Sister      Current Outpatient Medications:  .  acetaminophen (TYLENOL) 325 MG tablet, Take 650 mg by mouth every 6 (six) hours as needed for mild pain. , Disp: , Rfl:  .  amiodarone (PACERONE) 200 MG tablet, Take 1 tablet (200 mg total) by mouth 2 (two)  times daily for 10 days, THEN 1 tablet (200 mg total) daily., Disp: 90 tablet, Rfl: 3 .  amLODipine (NORVASC) 5 MG tablet, Take 5 mg by mouth daily. , Disp: , Rfl:  .  apixaban (ELIQUIS) 5 MG TABS tablet, Take 1 tablet (5 mg total) by mouth 2 (two) times daily., Disp: 180 tablet, Rfl: 0 .  aspirin 81 MG EC tablet, Take 81 mg by mouth daily. , Disp: , Rfl:  .  atorvastatin (LIPITOR) 40 MG tablet, Take 40 mg by mouth daily. , Disp: , Rfl:  .  carboxymethylcellul-glycerin (OPTIVE) 0.5-0.9 % ophthalmic solution, Place 1 drop into both eyes 3 (three) times daily as needed for dry eyes., Disp: , Rfl:  .  Cholecalciferol 25 MCG (1000 UT)  tablet, Take 1,000 Units by mouth daily. , Disp: , Rfl:  .  hydrocortisone cream 1 %, Apply 1 application topically daily as needed for itching., Disp: , Rfl:  .  latanoprost (XALATAN) 0.005 % ophthalmic solution, INSTILL 1 DROP INTO BOTH  EYES AT BEDTIME (Patient taking differently: Place 1 drop into both eyes at bedtime. INSTILL 1 DROP INTO BOTH  EYES AT BEDTIME), Disp: 5 mL, Rfl: 5 .  losartan (COZAAR) 50 MG tablet, Take 1 tablet (50 mg total) by mouth daily., Disp: 90 tablet, Rfl: 0 .  metoprolol tartrate (LOPRESSOR) 50 MG tablet, Take 1 tablet (50 mg total) by mouth 2 (two) times daily., Disp: 60 tablet, Rfl: 0 .  sertraline (ZOLOFT) 50 MG tablet, Take 1 tablet (50 mg total) by mouth daily., Disp: 90 tablet, Rfl: 1 .  ALPRAZolam (XANAX) 0.5 MG tablet, Take 1 tablet (0.5 mg total) by mouth at bedtime as needed for anxiety., Disp: 30 tablet, Rfl: 2 .  OXYGEN, Inhale 2 L into the lungs at bedtime.  (Patient not taking: Reported on 08/06/2020), Disp: , Rfl:   Review of Systems:  Constitutional: Denies fever, chills, diaphoresis, appetite change. HEENT: Denies photophobia, eye pain, redness, hearing loss, ear pain, congestion, sore throat, rhinorrhea, sneezing, mouth sores, trouble swallowing, neck pain, neck stiffness and tinnitus.   Respiratory: Denies  cough, chest tightness,  and wheezing.   Cardiovascular: Denies chest pain, palpitations and leg swelling.  Gastrointestinal: Denies nausea, vomiting, abdominal pain, diarrhea, constipation, blood in stool and abdominal distention.  Genitourinary: Denies dysuria, urgency, frequency, hematuria, flank pain and difficulty urinating.  Endocrine: Denies: hot or cold intolerance, sweats, changes in hair or nails, polyuria, polydipsia. Musculoskeletal: Denies myalgias, back pain, joint swelling, arthralgias and gait problem.  Skin: Denies pallor, rash and wound.  Neurological: Denies dizziness, seizures, syncope, numbness and headaches.  Hematological:  Denies adenopathy. Easy bruising, personal or family bleeding history  Psychiatric/Behavioral: Denies suicidal ideation, mood changes, confusion, nervousness, sleep disturbance and agitation    Physical Exam: Vitals:   08/06/20 1007  BP: 110/70  Pulse: (!) 58  Temp: 97.9 F (36.6 C)  TempSrc: Oral  SpO2: 98%  Weight: 140 lb 1.6 oz (63.5 kg)    Body mass index is 22.61 kg/m.  Constitutional: NAD, calm, comfortable Eyes: PERRL, lids and conjunctivae normal ENMT: Mucous membranes are moist. Respiratory: clear to auscultation bilaterally, no wheezing, no crackles. Normal respiratory effort. No accessory muscle use.  Cardiovascular: Regular rate and rhythm, no murmurs / rubs / gallops. No extremity edema.  Neurologic: Grossly intact and nonfocal. Psychiatric: Normal judgment and insight. Alert and oriented x 3. Normal mood.    Impression and Plan:   Fatigue, unspecified type  DOE (dyspnea on exertion)  PAT (  paroxysmal atrial tachycardia) (HCC)  Coronary artery disease involving native coronary artery of native heart with angina pectoris (HCC)  Hyperlipidemia, unspecified hyperlipidemia type  GAD (generalized anxiety disorder)  -EKG done in office today interpreted as sinus bradycardia at a rate of 55, septal T wave inversion unchanged from EKG from November 2021. -I am concerned that her symptoms and especially her EKG from early January while in the emergency department could be an anginal equivalent. -I have discussed case with cardiologist Dr. Rudean Haskell.  We agree that possibly she has some baseline nonobstructive CAD that is being exacerbated by her rapid rates. -He will arrange follow-up for her in the office next week to pursue further testing, likely in the way of a coronary CT.  Time spent: 45 minutes reviewing ED records, interpreting EKGs, discussing case with cardiologist on-call and formulating plan of care.     Lelon Frohlich,  MD  Primary Care at [facility]

## 2020-08-06 NOTE — Telephone Encounter (Signed)
FYI--Patient called back and scheduled for 08/13/20 at 11:40 AM with Dr. Cristal Deer.

## 2020-08-06 NOTE — Telephone Encounter (Signed)
Call transferred to DOD Pod in regards to this pts PCP Dr. Ardyth Harps, wanting to speak with  DOD Dr. Izora Ribas.   to discuss this pt. Pt is an established Dr. Cristal Deer and Dr. Lalla Brothers pt. Pt recently went to the ER for DOE, and left AMA due to wait time.  Pt went in to see her PCP Dr. Ardyth Harps today, for follow-up of recent ER visit.  Dr. Ardyth Harps spoke with Dr. Izora Ribas on the phone and both agreed pt should be seen next week with her Primary Cardiologist, Dr. Cristal Deer, at our [facility] office.  Per PCP pt has history of DOE, sinus tach, ST-Depression on recent EKG.  Both PCP and Dr. Izora Ribas agree this pt should be seen for further anginal work-up. Will send this message to [facility] triage pool to further follow-up with the pt in arranging an appt next week with Dr. Cristal Deer or a Provider on her care team.  Will also include [facility] scheduling on this pt, to assist with getting this pt an appt next week.  Pt is aware that [facility] office will call to arrange.

## 2020-08-12 ENCOUNTER — Telehealth: Payer: Self-pay | Admitting: Internal Medicine

## 2020-08-12 DIAGNOSIS — F411 Generalized anxiety disorder: Secondary | ICD-10-CM

## 2020-08-13 ENCOUNTER — Ambulatory Visit (INDEPENDENT_AMBULATORY_CARE_PROVIDER_SITE_OTHER): Payer: Medicare Other | Admitting: Cardiology

## 2020-08-13 ENCOUNTER — Encounter: Payer: Self-pay | Admitting: Cardiology

## 2020-08-13 ENCOUNTER — Other Ambulatory Visit: Payer: Self-pay

## 2020-08-13 VITALS — BP 124/62 | Ht 66.0 in | Wt 142.0 lb

## 2020-08-13 DIAGNOSIS — I10 Essential (primary) hypertension: Secondary | ICD-10-CM

## 2020-08-13 DIAGNOSIS — R5383 Other fatigue: Secondary | ICD-10-CM

## 2020-08-13 DIAGNOSIS — R0600 Dyspnea, unspecified: Secondary | ICD-10-CM | POA: Diagnosis not present

## 2020-08-13 DIAGNOSIS — Z01812 Encounter for preprocedural laboratory examination: Secondary | ICD-10-CM

## 2020-08-13 DIAGNOSIS — R0609 Other forms of dyspnea: Secondary | ICD-10-CM

## 2020-08-13 DIAGNOSIS — I471 Supraventricular tachycardia: Secondary | ICD-10-CM

## 2020-08-13 DIAGNOSIS — I25118 Atherosclerotic heart disease of native coronary artery with other forms of angina pectoris: Secondary | ICD-10-CM | POA: Diagnosis not present

## 2020-08-13 DIAGNOSIS — I25119 Atherosclerotic heart disease of native coronary artery with unspecified angina pectoris: Secondary | ICD-10-CM

## 2020-08-13 NOTE — H&P (View-Only) (Signed)
Cardiology Office Note:    Date:  08/13/2020   ID:  Brooke [name], DOB [birth year], MRN [id number]  PCP:  Brooke [name], Brooke Patricia, MD  Cardiologist:  Jodelle Red, MD  Referring MD: Brooke [name], Brooke [name]*   CC: follow up  History of Present Illness:    Brooke [name] is a 86 y.o. female with a hx of CAD, hyperlipidemia, atrial fibrillation, atrial flutter, paroxysmal atrial tachycardia, TIA, hypertension who is seen for follow up today. I initially saw her 02/23/20 as a new consult at the request of Brooke [name], Almira Bar* for the evaluation and management of increasing fatigue.  Cardiac history: history of CAD with stent placement (she believes 2 stents; see cath report below, likely these were the LAD and OM2 stents placed in 11/2016).   Able to access note from 09/22/19 from Dr. Weber Cooks at [facility]. Noted cardiac history summarized as below: CTI Flutter ablation 05/13/2018 Trialed on tikosyn, did not control her her atrial fibrillation. Noted to have conversion pauses. PVI/SVT ablation 05/16/2019 Atrial tachycardia noted post ablation 05/2019, had EP study On flecainide for brief paroxysmal atrial tachycardia seen on monitor. Amiodarone caused her to have palpitations in the past  Today: Came to ER 07/2020 but did not wait to be seen. HsTnI normal. ECG with sinus tachycardia in the 120s and inferolateral ST depressions. This was a change from ECG from 05/26/20. Seen by Dr. Philip [name] on 08/06/20.   Here with husband today. Main concern is nervousness, shaky hands, sense of dread. Has been on Zoloft for about a month with no change in her symptoms. Feels weak, tired. Has to keep resting, even with washing dishes.   On amiodarone and metoprolol for her atrial arrhythmias. Doesn't notice much difference except that her heart rate has been mildly slower. Didn't change her main symptoms, and if anything felt a little worse on more medications.  We discussed workup to date and  options for next steps. Her echo, monitor, and nuclear stress were not suggestive of a cardiac etiology. However, she has never had typical anginal symptoms. Prior to her 2 stents in the past, she was largely having syncope. Has not had any chest pain. Her deep ST depressions with tachycardia are concerning to me, though her hsTn was normal.  We discussed cath at length today. After shared decision making, she wishes to proceed with this.  Past Medical History:  Diagnosis Date  . Atrial fibrillation (HCC)   . CAD (coronary artery disease)   . Glaucoma   . Hyperlipidemia   . OA (osteoarthritis) of hip   . Tachycardia     Past Surgical History:  Procedure Laterality Date  . ABLATION      Current Medications: Current Outpatient Medications on File Prior to Visit  Medication Sig  . acetaminophen (TYLENOL) 325 MG tablet Take 650 mg by mouth every 6 (six) hours as needed for mild pain.   Marland Kitchen ALPRAZolam (XANAX) 0.5 MG tablet Take 1 tablet (0.5 mg total) by mouth at bedtime as needed for anxiety.  Marland Kitchen amiodarone (PACERONE) 200 MG tablet Take 1 tablet (200 mg total) by mouth 2 (two) times daily for 10 days, THEN 1 tablet (200 mg total) daily.  Marland Kitchen amLODipine (NORVASC) 5 MG tablet Take 5 mg by mouth daily.   Marland Kitchen apixaban (ELIQUIS) 5 MG TABS tablet Take 1 tablet (5 mg total) by mouth 2 (two) times daily.  Marland Kitchen aspirin 81 MG EC tablet Take 81 mg by mouth daily.   Marland Kitchen atorvastatin (LIPITOR)  40 MG tablet Take 40 mg by mouth daily.   Marland Kitchen. latanoprost (XALATAN) 0.005 % ophthalmic solution INSTILL 1 DROP INTO BOTH  EYES AT BEDTIME (Patient taking differently: Place 1 drop into both eyes at bedtime. INSTILL 1 DROP INTO BOTH  EYES AT BEDTIME)  . carboxymethylcellul-glycerin (OPTIVE) 0.5-0.9 % ophthalmic solution Place 1 drop into both eyes 3 (three) times daily as needed for dry eyes.  . Cholecalciferol 25 MCG (1000 UT) tablet Take 1,000 Units by mouth daily.   . hydrocortisone cream 1 % Apply 1 application topically  daily as needed for itching.  . losartan (COZAAR) 50 MG tablet Take 1 tablet (50 mg total) by mouth daily.  . metoprolol tartrate (LOPRESSOR) 50 MG tablet Take 1 tablet (50 mg total) by mouth 2 (two) times daily.  . OXYGEN Inhale 2 L into the lungs at bedtime.  (Patient not taking: Reported on 08/06/2020)  . sertraline (ZOLOFT) 50 MG tablet Take 1 tablet (50 mg total) by mouth daily.   No current facility-administered medications on file prior to visit.     Allergies:   Hydrocodone-acetaminophen   Social History   Tobacco Use  . Smoking status: Never Smoker  . Smokeless tobacco: Never Used  Substance Use Topics  . Alcohol use: Never  . Drug use: Never    Family History: family history includes Aortic stenosis in her sister; CAD in her father; CVA in her mother.  ROS:   Please see the history of present illness.  Additional pertinent ROS otherwise unremarkable.    EKGs/Labs/Other Studies Reviewed:    The following studies were reviewed today: Echo 03/12/20 1. Left ventricular ejection fraction, by estimation, is 60 to 65%. The  left ventricle has normal function. The left ventricle has no regional  wall motion abnormalities. Left ventricular diastolic parameters are  consistent with Grade III diastolic  dysfunction (restrictive). Elevated left atrial pressure.  2. Right ventricular systolic function is normal. The right ventricular  size is normal. There is normal pulmonary artery systolic pressure.  3. Left atrial size was mildly dilated.  4. The mitral valve is normal in structure. Mild mitral valve  regurgitation. No evidence of mitral stenosis.  5. Tricuspid valve regurgitation is mild to moderate.  6. The aortic valve is tricuspid. Aortic valve regurgitation is mild.  Mild aortic valve sclerosis is present, with no evidence of aortic valve  stenosis.  7. The inferior vena cava is normal in size with greater than 50%  respiratory variability, suggesting right  atrial pressure of 3 mmHg.  Stress test 03/23/20  Nuclear stress EF: 64%.  The left ventricular ejection fraction is normal (55-65%).  There was no ST segment deviation noted during stress.  The study is normal.  This is a low risk study.   Normal stress nuclear study with no ischemia or infarction; gated EF 64% with normal wall motion.  Monitor 05/25/20 HR 52-197, average 71. Rare ventricular ectopy. 309 SVT episodes, longest lasting 16.9 seconds. Review of strips of SVT suggests AT.  Echo 05/10/2018 (care everywhere) Interpretation Summary  A complete two-dimensional transthoracic echocardiogram with color flow  Doppler and spectral Doppler was performed. The left ventricle is grossly  normal size.  There is proximal septal thickening of the elderly.  The left ventricular ejection fraction is normal (65-70%).  The left ventricular wall motion is normal.  The aortic valve is trileaflet.  Mild aortic sclerosis is present with good valvular opening.  There is mild [1+] aortic regurgitation present.  Unable  to adequately determine diastolic dysfunction.  There is moderate (2+) tricuspid regurgitation.  Right ventricular systolic pressure is normal.  There is mild (1+) mitral regurgitation.  The left atrium is moderately dilated.  The right atrium is moderately dilated.   Cardiac cath 11/23/2016 (care everywhere) PRESSURE DETERMINATIONS: Aortic pressure was 172/76 mmHg with a mean  pressure 114 mmHg. Left ankle pressure was 172/18 mmHg. There was no  pressure gradient noted on pullback acrossthe aortic valve.   CORONARY ANGIOGRAPHY: The coronary angiograms are technically  satisfactory.   LEFT MAIN: Left main coronary artery has luminal irregularities.   LEFT ANTERIOR DESCENDING: Left anterior descending coronary artery has  mild proximal to mid ectasia with proximal 30% narrowing. The vessel is  moderate in size and extends transapically. First, second, and  third  diagonal branches are small with luminal irregularities. There are  sequential obstructive narrowings in the mid to distal vessel with maximal  narrowing of 80% to 90%.   CIRCUMFLEX: The circumflex system gives rise to a moderate size first  obtuse marginal branch with luminal irregularities. Second obtuse  marginal branch has critical 90% proximal narrowing. The distal  circumflex gives rise to a small to moderate size branch, which supplies a  posterolateral territory and has luminal irregularities.   RIGHT CORONARY ARTERY: The right coronary artery is a small caliber  codominant vessel with luminal irregularities.   CORONARY INTERVENTION: Following angiography, we proceeded with  intervention for the critical obstructive narrowings within the left  anterior descending coronary artery followed by the circumflex second  obtuse marginal branch.The patient received a weight-based Angiomax  bolus followed by an Angiomax infusion. She also received 180 mg loading  dose of Brilinta.   The ostium of the left main was engaged with a 6-French XB 3.5 guiding  catheter. The area of culprit disease in the left anterior descending  coronary artery was crossed without difficulty with a 0.014-inch 190 cm  long BMW wire. Predilation was performed with a 2.0 x 12 mm Sprinter  Legend balloon inflated to 10 atmospheres for 20 seconds. We then  administered 200 mcg of intracoronary nitroglycerin. The stenosis was  stented with a 2.25 x 22 mm medicated Resolute Onyx stent deployed at 12  atmospheres for 30 seconds. The proximal and mid portion of the stent  were postdilated with a 2.5 x 15 mm Romney Trek balloon inflated to 12  atmospheres for 20 seconds.   Imaging following removal of intracoronary guidewire reveals an acceptable  result. Preprocedure stenosis was maximally 90%, postprocedure residual  was 0%. Pre- and post-procedure TIMI flow are grade 3. This was a type C   lesion.   INTERVENTION OF THE CIRCUMFLEX SECOND OBTUSE MARGINAL BRANCH: We then  directed the BMW wire down the second obtuse marginal branch.   Pre-dilatation was performed with a 2.0 x 12 mm Sprinter Legend balloon  inflated to 10 atmospheres for 20 seconds. We then stented the stenosis  with a 2.5 x 15 mm medicated Resolute Onyx stent, which was deployed at 12  atmospheres for 20 seconds.   Imaging following removal of intracoronary guidewire reveals an acceptable  result. Preprocedure stenosis was 90%, postprocedure residual was 0%.   Pre- and post-procedure TIMI flow are grade 3. This was a type C lesion.   FINAL DIAGNOSES:  1. Critical obstructive narrowing of the mid to distal left anterior  descending coronary artery, status post stenting with placement of a  medicated stent, with acceptable results.  2. Obstructive narrowing  second obtuse marginal branch, status post  stenting with placement of a medicated stent with acceptable results.   RECOMMENDATIONS: Continued post-procedure care with dual antiplatelet  therapy.   EKG:  EKG is personally reviewed.  The ekg ordered today demonstrates NSR at 68 bpm  Recent Labs: 04/08/2020: ALT 17; TSH 1.11 05/26/2020: Magnesium 2.4 07/17/2020: BUN 19; Creatinine, Ser 0.58; Hemoglobin 14.6; Platelets 204; Potassium 4.9; Sodium 138  Recent Lipid Panel No results found for: CHOL, TRIG, HDL, CHOLHDL, VLDL, LDLCALC, LDLDIRECT  Physical Exam:    VS:  BP 124/62 (BP Location: Left Arm, Patient Position: Sitting)   Ht 5\' 6"  (1.676 m)   Wt 142 lb (64.4 kg)   SpO2 97%   BMI 22.92 kg/m     Wt Readings from Last 3 Encounters:  08/13/20 142 lb (64.4 kg)  08/06/20 140 lb 1.6 oz (63.5 kg)  07/14/20 138 lb 4.8 oz (62.7 kg)    GEN: Well nourished, well developed in no acute distress HEENT: Normal, moist mucous membranes NECK: No JVD CARDIAC: regular rhythm, normal S1 and S2, no rubs or gallops. No murmur. VASCULAR: Radial and DP  pulses 2+ bilaterally. No carotid bruits RESPIRATORY:  Clear to auscultation without rales, wheezing or rhonchi  ABDOMEN: Soft, non-tender, non-distended MUSCULOSKELETAL:  Ambulates independently SKIN: Warm and dry, no edema NEUROLOGIC:  Alert and oriented x 3. No focal neuro deficits noted. PSYCHIATRIC:  Normal affect   ASSESSMENT:    1. Coronary artery disease involving native coronary artery of native heart with other form of angina pectoris (HCC)   2. Pre-procedure lab exam   3. Fatigue, unspecified type   4. DOE (dyspnea on exertion)   5. Essential hypertension   6. Atrial tachycardia (HCC)    PLAN:    Fatigue, dyspnea on exertion, known history of CAD with prior stents: had never had traditional angina, concern this is an anginal equivalent -see echo, stress test, monitor results above -normal EF, but has diastolic dysfunction (grade 3) -no evidence of ischemia on stress test -monitor with intermittent atrial tachycardia, consistent with below -her ECG changes at recent ER visit are very concerning. Given her persistent symptoms, we discussed coronary angiography today. I discussed the procedure in detail with the patient and her husband.  -Risks and benefits of cardiac catheterization have been discussed with the patient.  These include bleeding, infection, kidney damage, stroke, heart attack, death.  The patient understands these risks and is willing to proceed. -CBC, BMET today -ECG done today -cath w/Dr. Katrinka Blazing on 08/19/20 -follow up with me 09/03/20 -hold apixaban prior to cath  Atrial fibrillation, atrial flutter, atrial tachycardia: extensive history, see summary -given her CAD, we stopped flecainide, despite the fact that it worked well for her -on metoprolol tartrate 50 mg BID and now on amiodarone -no change to symptoms -continue apixaban 5 mg BID (meets only age criteria for reduced dose) except around cath -CHA2DS2/VAS Stroke Risk Points= 5  History of  TIA Hyperlipidemia CAD -continue atorvastatin 40 mg daily -on aspirin as well as apixaban -normal EF, no ischemia on recent testing, but concern as above for atypical angina  Hypertension: -continue losartan  Cardiac risk counseling and prevention recommendations: -recommend heart healthy/Mediterranean diet, with whole grains, fruits, vegetable, fish, lean meats, nuts, and olive oil. Limit salt. -recommend moderate walking, 3-5 times/week for 30-50 minutes each session. Aim for at least 150 minutes.week. Goal should be pace of 3 miles/hours, or walking 1.5 miles in 30 minutes -recommend avoidance of tobacco products. Avoid  excess alcohol.  Plan for follow up: 2 weeks after cath  Total time of encounter: 50 minutes total time of encounter, including 32 minutes spent in face-to-face patient care. This time includes coordination of care and counseling regarding cath. Remainder of non-face-to-face time involved reviewing chart documents/testing relevant to the patient encounter and documentation in the medical record.  Jodelle Red, MD, PhD, [facility]  [facility]   Medication Adjustments/Labs and Tests Ordered: Current medicines are reviewed at length with the patient today.  Concerns regarding medicines are outlined above.  Orders Placed This Encounter  Procedures  . CBC  . Basic metabolic panel   No orders of the defined types were placed in this encounter.   Patient Instructions  Medication Instructions:  Your Physician recommend you continue on your current medication as directed.    *If you need a refill on your cardiac medications before your next appointment, please call your pharmacy*   Lab Work: Your physician recommends lab work today ( CBC, BMP).   If you have labs (blood work) drawn today and your tests are completely normal, you will receive your results only by: Marland Kitchen MyChart Message (if you have MyChart) OR . A paper copy in the mail If you  have any lab test that is abnormal or we need to change your treatment, we will call you to review the results.   Testing/Procedures: Your physician has requested that you have a cardiac catheterization. Cardiac catheterization is used to diagnose and/or treat various heart conditions. Doctors may recommend this procedure for a number of different reasons. The most common reason is to evaluate chest pain. Chest pain can be a symptom of coronary artery disease (CAD), and cardiac catheterization can show whether plaque is narrowing or blocking your heart's arteries. This procedure is also used to evaluate the valves, as well as measure the blood flow and oxygen levels in different parts of your heart. For further information please visit [email or web address] Please follow instruction sheet, as given. [facility]  You will need to have the coronavirus test completed prior to your procedure. An appointment has been made at 12:20 on Monday 08/16/20. This is a Drive Up Visit at [address] [place], [place], [place] [address]. Please tell them that you are there for procedure testing. Stay in your car and someone will be with you shortly. Please make sure to have all other labs completed before this test because you will need to stay quarantined until your procedure.      Follow-Up: At [facility], you and your health needs are our priority.  As part of our continuing mission to provide you with exceptional heart care, we have created designated Provider Care Teams.  These Care Teams include your primary Cardiologist (physician) and Advanced Practice Providers (APPs -  Physician Assistants and Nurse Practitioners) who all work together to provide you with the care you need, when you need it.  We recommend signing up for the patient portal called "MyChart".  Sign up information is provided on this After Visit Summary.  MyChart is used to connect with patients for Virtual Visits (Telemedicine).   Patients are able to view lab/test results, encounter notes, upcoming appointments, etc.  Non-urgent messages can be sent to your provider as well.   To learn more about what you can do with MyChart, go to [email or web address].    Your next appointment:   3 week(s)  The format for your next appointment:   In Person  Provider:  Jodelle Red, MD      [facility] CARDIOVASCULAR DIVISION [facility] [address] [place] [address] [place] [address] Dept: [phone] Loc: [phone]  Sherry Arroyo  08/13/2020  You are scheduled for a Cardiac Catheterization on Thursday, February 10 with Dr. Verdis Prime.  1. Please arrive at the [facility] (Main Entrance A) at [facility]: [address] [place], [place] [address] at 8:30 AM (This time is two hours before your procedure to ensure your preparation). Free valet parking service is available.   Special note: Every effort is made to have your procedure done on time. Please understand that emergencies sometimes delay scheduled procedures.  2. Diet: Do not eat solid foods after midnight.  The patient may have clear liquids until 5am upon the day of the procedure.  3. Labs: You will need to have blood drawn today (CBC, BMP)  4. Medication instructions in preparation for your procedure:   Contrast Allergy: No   Stop taking Eliquis (Apixiban) on Tuesday, February 8.   On the morning of your procedure, take your Aspirin and any morning medicines NOT listed above.  You may use sips of water.  5. Plan for one night stay--bring personal belongings. 6. Bring a current list of your medications and current insurance cards. 7. You MUST have a responsible person to drive you home. 8. Someone MUST be with you the first 24 hours after you arrive home or your discharge will be delayed. 9. Please wear clothes that are easy to get on and off and wear slip-on shoes.  Thank you for allowing  [place] to care for you!   --  Invasive Cardiovascular services     Signed, Jodelle Red, MD PhD 08/13/2020  [facility]

## 2020-08-13 NOTE — Patient Instructions (Addendum)
Medication Instructions:  Your Physician recommend you continue on your current medication as directed.    *If you need a refill on your cardiac medications before your next appointment, please call your pharmacy*   Lab Work: Your physician recommends lab work today ( CBC, BMP).   If you have labs (blood work) drawn today and your tests are completely normal, you will receive your results only by: Marland Kitchen MyChart Message (if you have MyChart) OR . A paper copy in the mail If you have any lab test that is abnormal or we need to change your treatment, we will call you to review the results.   Testing/Procedures: Your physician has requested that you have a cardiac catheterization. Cardiac catheterization is used to diagnose and/or treat various heart conditions. Doctors may recommend this procedure for a number of different reasons. The most common reason is to evaluate chest pain. Chest pain can be a symptom of coronary artery disease (CAD), and cardiac catheterization can show whether plaque is narrowing or blocking your heart's arteries. This procedure is also used to evaluate the valves, as well as measure the blood flow and oxygen levels in different parts of your heart. For further information please visit [email or web address] Please follow instruction sheet, as given. [facility]  You will need to have the coronavirus test completed prior to your procedure. An appointment has been made at 12:20 on Monday 08/16/20. This is a Drive Up Visit at [address] [place], [place], [place] [address]. Please tell them that you are there for procedure testing. Stay in your car and someone will be with you shortly. Please make sure to have all other labs completed before this test because you will need to stay quarantined until your procedure.      Follow-Up: At [facility], you and your health needs are our priority.  As part of our continuing mission to provide you with exceptional heart care,  we have created designated Provider Care Teams.  These Care Teams include your primary Cardiologist (physician) and Advanced Practice Providers (APPs -  Physician Assistants and Nurse Practitioners) who all work together to provide you with the care you need, when you need it.  We recommend signing up for the patient portal called "MyChart".  Sign up information is provided on this After Visit Summary.  MyChart is used to connect with patients for Virtual Visits (Telemedicine).  Patients are able to view lab/test results, encounter notes, upcoming appointments, etc.  Non-urgent messages can be sent to your provider as well.   To learn more about what you can do with MyChart, go to [email or web address].    Your next appointment:   3 week(s)  The format for your next appointment:   In Person  Provider:   Jodelle Red, MD      [facility] CARDIOVASCULAR DIVISION [facility] [address] [place] [place] [address] Dept: [phone] Loc: [phone]  Audrina Marten  08/13/2020  You are scheduled for a Cardiac Catheterization on Thursday, February 10 with Dr. Verdis Prime.  1. Please arrive at the [facility] (Main Entrance A) at [facility]: [address] [place], [place] [address] at 8:30 AM (This time is two hours before your procedure to ensure your preparation). Free valet parking service is available.   Special note: Every effort is made to have your procedure done on time. Please understand that emergencies sometimes delay scheduled procedures.  2. Diet: Do not eat solid foods after midnight.  The patient may have clear liquids until 5am upon the day of the procedure.  3. Labs: You will need to have blood drawn today (CBC, BMP)  4. Medication instructions in preparation for your procedure:   Contrast Allergy: No   Stop taking Eliquis (Apixiban) on Tuesday, February 8.   On the morning of your procedure, take  your Aspirin and any morning medicines NOT listed above.  You may use sips of water.  5. Plan for one night stay--bring personal belongings. 6. Bring a current list of your medications and current insurance cards. 7. You MUST have a responsible person to drive you home. 8. Someone MUST be with you the first 24 hours after you arrive home or your discharge will be delayed. 9. Please wear clothes that are easy to get on and off and wear slip-on shoes.  Thank you for allowing [place] to care for you!   -- [facility]

## 2020-08-13 NOTE — Telephone Encounter (Signed)
Rx denied

## 2020-08-13 NOTE — Progress Notes (Signed)
Cardiology Office Note:    Date:  08/13/2020   ID:  Brooke [name], DOB 06/27/1935, MRN [id number]  PCP:  Hernandez Acosta, Estela Y, MD  Cardiologist:  Vihaan Gloss, MD  Referring MD: Hernandez Acosta, Estel*   CC: follow up  History of Present Illness:    Brooke [name] is a 85 y.o. female with a hx of CAD, hyperlipidemia, atrial fibrillation, atrial flutter, paroxysmal atrial tachycardia, TIA, hypertension who is seen for follow up today. I initially saw her 02/23/20 as a new consult at the request of Hernandez Acosta, Estel* for the evaluation and management of increasing fatigue.  Cardiac history: history of CAD with stent placement (she believes 2 stents; see cath report below, likely these were the LAD and OM2 stents placed in 11/2016).   Able to access note from 09/22/19 from Dr. Kok at [facility]. Noted cardiac history summarized as below: CTI Flutter ablation 05/13/2018 Trialed on tikosyn, did not control her her atrial fibrillation. Noted to have conversion pauses. PVI/SVT ablation 05/16/2019 Atrial tachycardia noted post ablation 05/2019, had EP study On flecainide for brief paroxysmal atrial tachycardia seen on monitor. Amiodarone caused her to have palpitations in the past  Today: Came to ER 07/2020 but did not wait to be seen. HsTnI normal. ECG with sinus tachycardia in the 120s and inferolateral ST depressions. This was a change from ECG from 05/26/20. Seen by Dr. Hernandez Acosta on 08/06/20.   Here with husband today. Main concern is nervousness, shaky hands, sense of dread. Has been on Zoloft for about a month with no change in her symptoms. Feels weak, tired. Has to keep resting, even with washing dishes.   On amiodarone and metoprolol for her atrial arrhythmias. Doesn't notice much difference except that her heart rate has been mildly slower. Didn't change her main symptoms, and if anything felt a little worse on more medications.  We discussed workup to date and  options for next steps. Her echo, monitor, and nuclear stress were not suggestive of a cardiac etiology. However, she has never had typical anginal symptoms. Prior to her 2 stents in the past, she was largely having syncope. Has not had any chest pain. Her deep ST depressions with tachycardia are concerning to me, though her hsTn was normal.  We discussed cath at length today. After shared decision making, she wishes to proceed with this.  Past Medical History:  Diagnosis Date  . Atrial fibrillation (HCC)   . CAD (coronary artery disease)   . Glaucoma   . Hyperlipidemia   . OA (osteoarthritis) of hip   . Tachycardia     Past Surgical History:  Procedure Laterality Date  . ABLATION      Current Medications: Current Outpatient Medications on File Prior to Visit  Medication Sig  . acetaminophen (TYLENOL) 325 MG tablet Take 650 mg by mouth every 6 (six) hours as needed for mild pain.   . ALPRAZolam (XANAX) 0.5 MG tablet Take 1 tablet (0.5 mg total) by mouth at bedtime as needed for anxiety.  . amiodarone (PACERONE) 200 MG tablet Take 1 tablet (200 mg total) by mouth 2 (two) times daily for 10 days, THEN 1 tablet (200 mg total) daily.  . amLODipine (NORVASC) 5 MG tablet Take 5 mg by mouth daily.   . apixaban (ELIQUIS) 5 MG TABS tablet Take 1 tablet (5 mg total) by mouth 2 (two) times daily.  . aspirin 81 MG EC tablet Take 81 mg by mouth daily.   . atorvastatin (LIPITOR)   40 MG tablet Take 40 mg by mouth daily.   . latanoprost (XALATAN) 0.005 % ophthalmic solution INSTILL 1 DROP INTO BOTH  EYES AT BEDTIME (Patient taking differently: Place 1 drop into both eyes at bedtime. INSTILL 1 DROP INTO BOTH  EYES AT BEDTIME)  . carboxymethylcellul-glycerin (OPTIVE) 0.5-0.9 % ophthalmic solution Place 1 drop into both eyes 3 (three) times daily as needed for dry eyes.  . Cholecalciferol 25 MCG (1000 UT) tablet Take 1,000 Units by mouth daily.   . hydrocortisone cream 1 % Apply 1 application topically  daily as needed for itching.  . losartan (COZAAR) 50 MG tablet Take 1 tablet (50 mg total) by mouth daily.  . metoprolol tartrate (LOPRESSOR) 50 MG tablet Take 1 tablet (50 mg total) by mouth 2 (two) times daily.  . OXYGEN Inhale 2 L into the lungs at bedtime.  (Patient not taking: Reported on 08/06/2020)  . sertraline (ZOLOFT) 50 MG tablet Take 1 tablet (50 mg total) by mouth daily.   No current facility-administered medications on file prior to visit.     Allergies:   Hydrocodone-acetaminophen   Social History   Tobacco Use  . Smoking status: Never Smoker  . Smokeless tobacco: Never Used  Substance Use Topics  . Alcohol use: Never  . Drug use: Never    Family History: family history includes Aortic stenosis in her sister; CAD in her father; CVA in her mother.  ROS:   Please see the history of present illness.  Additional pertinent ROS otherwise unremarkable.    EKGs/Labs/Other Studies Reviewed:    The following studies were reviewed today: Echo 03/12/20 1. Left ventricular ejection fraction, by estimation, is 60 to 65%. The  left ventricle has normal function. The left ventricle has no regional  wall motion abnormalities. Left ventricular diastolic parameters are  consistent with Grade III diastolic  dysfunction (restrictive). Elevated left atrial pressure.  2. Right ventricular systolic function is normal. The right ventricular  size is normal. There is normal pulmonary artery systolic pressure.  3. Left atrial size was mildly dilated.  4. The mitral valve is normal in structure. Mild mitral valve  regurgitation. No evidence of mitral stenosis.  5. Tricuspid valve regurgitation is mild to moderate.  6. The aortic valve is tricuspid. Aortic valve regurgitation is mild.  Mild aortic valve sclerosis is present, with no evidence of aortic valve  stenosis.  7. The inferior vena cava is normal in size with greater than 50%  respiratory variability, suggesting right  atrial pressure of 3 mmHg.  Stress test 03/23/20  Nuclear stress EF: 64%.  The left ventricular ejection fraction is normal (55-65%).  There was no ST segment deviation noted during stress.  The study is normal.  This is a low risk study.   Normal stress nuclear study with no ischemia or infarction; gated EF 64% with normal wall motion.  Monitor 05/25/20 HR 52-197, average 71. Rare ventricular ectopy. 309 SVT episodes, longest lasting 16.9 seconds. Review of strips of SVT suggests AT.  Echo 05/10/2018 (care everywhere) Interpretation Summary  A complete two-dimensional transthoracic echocardiogram with color flow  Doppler and spectral Doppler was performed. The left ventricle is grossly  normal size.  There is proximal septal thickening of the elderly.  The left ventricular ejection fraction is normal (65-70%).  The left ventricular wall motion is normal.  The aortic valve is trileaflet.  Mild aortic sclerosis is present with good valvular opening.  There is mild [1+] aortic regurgitation present.  Unable   to adequately determine diastolic dysfunction.  There is moderate (2+) tricuspid regurgitation.  Right ventricular systolic pressure is normal.  There is mild (1+) mitral regurgitation.  The left atrium is moderately dilated.  The right atrium is moderately dilated.   Cardiac cath 11/23/2016 (care everywhere) PRESSURE DETERMINATIONS: Aortic pressure was 172/76 mmHg with a mean  pressure 114 mmHg. Left ankle pressure was 172/18 mmHg. There was no  pressure gradient noted on pullback acrossthe aortic valve.   CORONARY ANGIOGRAPHY: The coronary angiograms are technically  satisfactory.   LEFT MAIN: Left main coronary artery has luminal irregularities.   LEFT ANTERIOR DESCENDING: Left anterior descending coronary artery has  mild proximal to mid ectasia with proximal 30% narrowing. The vessel is  moderate in size and extends transapically. First, second, and  third  diagonal branches are small with luminal irregularities. There are  sequential obstructive narrowings in the mid to distal vessel with maximal  narrowing of 80% to 90%.   CIRCUMFLEX: The circumflex system gives rise to a moderate size first  obtuse marginal branch with luminal irregularities. Second obtuse  marginal branch has critical 90% proximal narrowing. The distal  circumflex gives rise to a small to moderate size branch, which supplies a  posterolateral territory and has luminal irregularities.   RIGHT CORONARY ARTERY: The right coronary artery is a small caliber  codominant vessel with luminal irregularities.   CORONARY INTERVENTION: Following angiography, we proceeded with  intervention for the critical obstructive narrowings within the left  anterior descending coronary artery followed by the circumflex second  obtuse marginal branch.The patient received a weight-based Angiomax  bolus followed by an Angiomax infusion. She also received 180 mg loading  dose of Brilinta.   The ostium of the left main was engaged with a 6-French XB 3.5 guiding  catheter. The area of culprit disease in the left anterior descending  coronary artery was crossed without difficulty with a 0.014-inch 190 cm  long BMW wire. Predilation was performed with a 2.0 x 12 mm Sprinter  Legend balloon inflated to 10 atmospheres for 20 seconds. We then  administered 200 mcg of intracoronary nitroglycerin. The stenosis was  stented with a 2.25 x 22 mm medicated Resolute Onyx stent deployed at 12  atmospheres for 30 seconds. The proximal and mid portion of the stent  were postdilated with a 2.5 x 15 mm [place] Trek balloon inflated to 12  atmospheres for 20 seconds.   Imaging following removal of intracoronary guidewire reveals an acceptable  result. Preprocedure stenosis was maximally 90%, postprocedure residual  was 0%. Pre- and post-procedure TIMI flow are grade 3. This was a type C   lesion.   INTERVENTION OF THE CIRCUMFLEX SECOND OBTUSE MARGINAL BRANCH: We then  directed the BMW wire down the second obtuse marginal branch.   Pre-dilatation was performed with a 2.0 x 12 mm Sprinter Legend balloon  inflated to 10 atmospheres for 20 seconds. We then stented the stenosis  with a 2.5 x 15 mm medicated Resolute Onyx stent, which was deployed at 12  atmospheres for 20 seconds.   Imaging following removal of intracoronary guidewire reveals an acceptable  result. Preprocedure stenosis was 90%, postprocedure residual was 0%.   Pre- and post-procedure TIMI flow are grade 3. This was a type C lesion.   FINAL DIAGNOSES:  1. Critical obstructive narrowing of the mid to distal left anterior  descending coronary artery, status post stenting with placement of a  medicated stent, with acceptable results.  2. Obstructive narrowing   second obtuse marginal branch, status post  stenting with placement of a medicated stent with acceptable results.   RECOMMENDATIONS: Continued post-procedure care with dual antiplatelet  therapy.   EKG:  EKG is personally reviewed.  The ekg ordered today demonstrates NSR at 68 bpm  Recent Labs: 04/08/2020: ALT 17; TSH 1.11 05/26/2020: Magnesium 2.4 07/17/2020: BUN 19; Creatinine, Ser 0.58; Hemoglobin 14.6; Platelets 204; Potassium 4.9; Sodium 138  Recent Lipid Panel No results found for: CHOL, TRIG, HDL, CHOLHDL, VLDL, LDLCALC, LDLDIRECT  Physical Exam:    VS:  BP 124/62 (BP Location: Left Arm, Patient Position: Sitting)   Ht 5' 6" (1.676 m)   Wt 142 lb (64.4 kg)   SpO2 97%   BMI 22.92 kg/m     Wt Readings from Last 3 Encounters:  08/13/20 142 lb (64.4 kg)  08/06/20 140 lb 1.6 oz (63.5 kg)  07/14/20 138 lb 4.8 oz (62.7 kg)    GEN: Well nourished, well developed in no acute distress HEENT: Normal, moist mucous membranes NECK: No JVD CARDIAC: regular rhythm, normal S1 and S2, no rubs or gallops. No murmur. VASCULAR: Radial and DP  pulses 2+ bilaterally. No carotid bruits RESPIRATORY:  Clear to auscultation without rales, wheezing or rhonchi  ABDOMEN: Soft, non-tender, non-distended MUSCULOSKELETAL:  Ambulates independently SKIN: Warm and dry, no edema NEUROLOGIC:  Alert and oriented x 3. No focal neuro deficits noted. PSYCHIATRIC:  Normal affect   ASSESSMENT:    1. Coronary artery disease involving native coronary artery of native heart with other form of angina pectoris (HCC)   2. Pre-procedure lab exam   3. Fatigue, unspecified type   4. DOE (dyspnea on exertion)   5. Essential hypertension   6. Atrial tachycardia (HCC)    PLAN:    Fatigue, dyspnea on exertion, known history of CAD with prior stents: had never had traditional angina, concern this is an anginal equivalent -see echo, stress test, monitor results above -normal EF, but has diastolic dysfunction (grade 3) -no evidence of ischemia on stress test -monitor with intermittent atrial tachycardia, consistent with below -her ECG changes at recent ER visit are very concerning. Given her persistent symptoms, we discussed coronary angiography today. I discussed the procedure in detail with the patient and her husband.  -Risks and benefits of cardiac catheterization have been discussed with the patient.  These include bleeding, infection, kidney damage, stroke, heart attack, death.  The patient understands these risks and is willing to proceed. -CBC, BMET today -ECG done today -cath w/Dr. Smith on 08/19/20 -follow up with me 09/03/20 -hold apixaban prior to cath  Atrial fibrillation, atrial flutter, atrial tachycardia: extensive history, see summary -given her CAD, we stopped flecainide, despite the fact that it worked well for her -on metoprolol tartrate 50 mg BID and now on amiodarone -no change to symptoms -continue apixaban 5 mg BID (meets only age criteria for reduced dose) except around cath -CHA2DS2/VAS Stroke Risk Points= 5  History of  TIA Hyperlipidemia CAD -continue atorvastatin 40 mg daily -on aspirin as well as apixaban -normal EF, no ischemia on recent testing, but concern as above for atypical angina  Hypertension: -continue losartan  Cardiac risk counseling and prevention recommendations: -recommend heart healthy/Mediterranean diet, with whole grains, fruits, vegetable, fish, lean meats, nuts, and olive oil. Limit salt. -recommend moderate walking, 3-5 times/week for 30-50 minutes each session. Aim for at least 150 minutes.week. Goal should be pace of 3 miles/hours, or walking 1.5 miles in 30 minutes -recommend avoidance of tobacco products. Avoid   excess alcohol.  Plan for follow up: 2 weeks after cath  Total time of encounter: 50 minutes total time of encounter, including 32 minutes spent in face-to-face patient care. This time includes coordination of care and counseling regarding cath. Remainder of non-face-to-face time involved reviewing chart documents/testing relevant to the patient encounter and documentation in the medical record.  Ziaire Bieser, MD, PhD, FACC [place]  [facility]   Medication Adjustments/Labs and Tests Ordered: Current medicines are reviewed at length with the patient today.  Concerns regarding medicines are outlined above.  Orders Placed This Encounter  Procedures  . CBC  . Basic metabolic panel   No orders of the defined types were placed in this encounter.   Patient Instructions  Medication Instructions:  Your Physician recommend you continue on your current medication as directed.    *If you need a refill on your cardiac medications before your next appointment, please call your pharmacy*   Lab Work: Your physician recommends lab work today ( CBC, BMP).   If you have labs (blood work) drawn today and your tests are completely normal, you will receive your results only by: . MyChart Message (if you have MyChart) OR . A paper copy in the mail If you  have any lab test that is abnormal or we need to change your treatment, we will call you to review the results.   Testing/Procedures: Your physician has requested that you have a cardiac catheterization. Cardiac catheterization is used to diagnose and/or treat various heart conditions. Doctors may recommend this procedure for a number of different reasons. The most common reason is to evaluate chest pain. Chest pain can be a symptom of coronary artery disease (CAD), and cardiac catheterization can show whether plaque is narrowing or blocking your heart's arteries. This procedure is also used to evaluate the valves, as well as measure the blood flow and oxygen levels in different parts of your heart. For further information please visit www.cardiosmart.org. Please follow instruction sheet, as given. [facility]  You will need to have the coronavirus test completed prior to your procedure. An appointment has been made at 12:20 on Monday 08/16/20. This is a Drive Up Visit at [address], [place], [place] [address]. Please tell them that you are there for procedure testing. Stay in your car and someone will be with you shortly. Please make sure to have all other labs completed before this test because you will need to stay quarantined until your procedure.      Follow-Up: At [facility], you and your health needs are our priority.  As part of our continuing mission to provide you with exceptional heart care, we have created designated Provider Care Teams.  These Care Teams include your primary Cardiologist (physician) and Advanced Practice Providers (APPs -  Physician Assistants and Nurse Practitioners) who all work together to provide you with the care you need, when you need it.  We recommend signing up for the patient portal called "MyChart".  Sign up information is provided on this After Visit Summary.  MyChart is used to connect with patients for Virtual Visits (Telemedicine).   Patients are able to view lab/test results, encounter notes, upcoming appointments, etc.  Non-urgent messages can be sent to your provider as well.   To learn more about what you can do with MyChart, go to [email or web address].    Your next appointment:   3 week(s)  The format for your next appointment:   In Person  Provider:     Fransisco Messmer, MD      [facility] [address] [place] [address] Dept: [phone] Loc: [phone]  Lynnley Kolden  08/13/2020  You are scheduled for a Cardiac Catheterization on Thursday, February 10 with Dr. Henry Smith.  1. Please arrive at the [place] [facility] (Main Entrance A) at [facility]: [address] [place], [place] [address] at 8:30 AM (This time is two hours before your procedure to ensure your preparation). Free valet parking service is available.   Special note: Every effort is made to have your procedure done on time. Please understand that emergencies sometimes delay scheduled procedures.  2. Diet: Do not eat solid foods after midnight.  The patient may have clear liquids until 5am upon the day of the procedure.  3. Labs: You will need to have blood drawn today (CBC, BMP)  4. Medication instructions in preparation for your procedure:   Contrast Allergy: No   Stop taking Eliquis (Apixiban) on Tuesday, February 8.   On the morning of your procedure, take your Aspirin and any morning medicines NOT listed above.  You may use sips of water.  5. Plan for one night stay--bring personal belongings. 6. Bring a current list of your medications and current insurance cards. 7. You MUST have a responsible person to drive you home. 8. Someone MUST be with you the first 24 hours after you arrive home or your discharge will be delayed. 9. Please wear clothes that are easy to get on and off and wear slip-on shoes.  Thank you for allowing  us to care for you!   -- [facility]     Signed, Dyanna Seiter, MD PhD 08/13/2020  [facility] 

## 2020-08-14 LAB — BASIC METABOLIC PANEL
BUN/Creatinine Ratio: 25 (ref 12–28)
BUN: 16 mg/dL (ref 8–27)
CO2: 25 mmol/L (ref 20–29)
Calcium: 9.5 mg/dL (ref 8.7–10.3)
Chloride: 101 mmol/L (ref 96–106)
Creatinine, Ser: 0.65 mg/dL (ref 0.57–1.00)
GFR calc Af Amer: 95 mL/min/{1.73_m2} (ref >59)
GFR calc non Af Amer: 82 mL/min/{1.73_m2} (ref >59)
Glucose: 97 mg/dL (ref 65–99)
Potassium: 4.4 mmol/L (ref 3.5–5.2)
Sodium: 139 mmol/L (ref 134–144)

## 2020-08-14 LAB — CBC
Hematocrit: 42.7 % (ref 34.0–46.6)
Hemoglobin: 14 g/dL (ref 11.1–15.9)
MCH: 31.2 pg (ref 26.6–33.0)
MCHC: 32.8 g/dL (ref 31.5–35.7)
MCV: 95 fL (ref 79–97)
Platelets: 197 10*3/uL (ref 150–450)
RBC: 4.49 x10E6/uL (ref 3.77–5.28)
RDW: 12.3 % (ref 11.7–15.4)
WBC: 6.1 10*3/uL (ref 3.4–10.8)

## 2020-08-16 ENCOUNTER — Other Ambulatory Visit (HOSPITAL_COMMUNITY)
Admission: RE | Admit: 2020-08-16 | Discharge: 2020-08-16 | Disposition: A | Discharge status: Home / Self Care | Payer: Medicare Other | Source: Ambulatory Visit | Attending: Interventional Cardiology | Admitting: Interventional Cardiology

## 2020-08-16 DIAGNOSIS — Z01812 Encounter for preprocedural laboratory examination: Secondary | ICD-10-CM | POA: Diagnosis present

## 2020-08-16 DIAGNOSIS — Z20822 Contact with and (suspected) exposure to covid-19: Secondary | ICD-10-CM | POA: Diagnosis not present

## 2020-08-16 LAB — SARS CORONAVIRUS 2 (TAT 6-24 HRS): SARS Coronavirus 2: NEGATIVE

## 2020-08-17 NOTE — Telephone Encounter (Signed)
Pt called to get a refill on her Xanax and she is aware that she should have some at the pharmacy and [facility] is going [facility] [address] and if they are not there she will give the office a call back.

## 2020-08-17 NOTE — Telephone Encounter (Signed)
Noted  

## 2020-08-17 NOTE — Addendum Note (Signed)
Addended by: Derenda Fennel on: 08/17/2020 03:16 PM   Modules accepted: Orders

## 2020-08-19 ENCOUNTER — Ambulatory Visit (HOSPITAL_COMMUNITY)
Admission: RE | Admit: 2020-08-19 | Discharge: 2020-08-19 | Disposition: A | Discharge status: Home / Self Care | Payer: Medicare Other | Attending: Interventional Cardiology | Admitting: Interventional Cardiology

## 2020-08-19 ENCOUNTER — Encounter (HOSPITAL_COMMUNITY): Payer: Self-pay | Admitting: Interventional Cardiology

## 2020-08-19 ENCOUNTER — Encounter (HOSPITAL_COMMUNITY)
Admission: RE | Disposition: A | Discharge status: Home / Self Care | Payer: Self-pay | Source: Home / Self Care | Attending: Interventional Cardiology

## 2020-08-19 DIAGNOSIS — I471 Supraventricular tachycardia: Secondary | ICD-10-CM | POA: Diagnosis not present

## 2020-08-19 DIAGNOSIS — Z7901 Long term (current) use of anticoagulants: Secondary | ICD-10-CM | POA: Insufficient documentation

## 2020-08-19 DIAGNOSIS — Z79899 Other long term (current) drug therapy: Secondary | ICD-10-CM | POA: Diagnosis not present

## 2020-08-19 DIAGNOSIS — Z955 Presence of coronary angioplasty implant and graft: Secondary | ICD-10-CM | POA: Insufficient documentation

## 2020-08-19 DIAGNOSIS — Z885 Allergy status to narcotic agent status: Secondary | ICD-10-CM | POA: Insufficient documentation

## 2020-08-19 DIAGNOSIS — I2510 Atherosclerotic heart disease of native coronary artery without angina pectoris: Secondary | ICD-10-CM

## 2020-08-19 DIAGNOSIS — E785 Hyperlipidemia, unspecified: Secondary | ICD-10-CM | POA: Diagnosis not present

## 2020-08-19 DIAGNOSIS — Z823 Family history of stroke: Secondary | ICD-10-CM | POA: Insufficient documentation

## 2020-08-19 DIAGNOSIS — Z8673 Personal history of transient ischemic attack (TIA), and cerebral infarction without residual deficits: Secondary | ICD-10-CM

## 2020-08-19 DIAGNOSIS — I10 Essential (primary) hypertension: Secondary | ICD-10-CM | POA: Diagnosis not present

## 2020-08-19 DIAGNOSIS — Z9981 Dependence on supplemental oxygen: Secondary | ICD-10-CM | POA: Diagnosis not present

## 2020-08-19 DIAGNOSIS — R5383 Other fatigue: Secondary | ICD-10-CM | POA: Insufficient documentation

## 2020-08-19 DIAGNOSIS — I4892 Unspecified atrial flutter: Secondary | ICD-10-CM | POA: Insufficient documentation

## 2020-08-19 DIAGNOSIS — I4891 Unspecified atrial fibrillation: Secondary | ICD-10-CM | POA: Insufficient documentation

## 2020-08-19 DIAGNOSIS — Z7982 Long term (current) use of aspirin: Secondary | ICD-10-CM | POA: Diagnosis not present

## 2020-08-19 DIAGNOSIS — Z8249 Family history of ischemic heart disease and other diseases of the circulatory system: Secondary | ICD-10-CM | POA: Diagnosis not present

## 2020-08-19 HISTORY — PX: LEFT HEART CATH AND CORONARY ANGIOGRAPHY: CATH118249

## 2020-08-19 SURGERY — LEFT HEART CATH AND CORONARY ANGIOGRAPHY
Anesthesia: LOCAL

## 2020-08-19 MED ORDER — VERAPAMIL HCL 2.5 MG/ML IV SOLN
INTRAVENOUS | Status: DC | PRN
  Administered 2020-08-19: 10 mL via INTRA_ARTERIAL

## 2020-08-19 MED ORDER — ONDANSETRON HCL 4 MG/2ML IJ SOLN: 4.0000 mg | Freq: Four times a day (QID) | INTRAMUSCULAR | Status: DC | PRN

## 2020-08-19 MED ORDER — SODIUM CHLORIDE 0.9 % IV SOLN: INTRAVENOUS | Status: DC

## 2020-08-19 MED ORDER — HEPARIN (PORCINE) IN NACL 1000-0.9 UT/500ML-% IV SOLN
INTRAVENOUS | Status: DC | PRN
  Administered 2020-08-19: 500 mL

## 2020-08-19 MED ORDER — HYDRALAZINE HCL 20 MG/ML IJ SOLN: 10.0000 mg | INTRAMUSCULAR | Status: DC | PRN

## 2020-08-19 MED ORDER — SODIUM CHLORIDE 0.9% FLUSH: 3.0000 mL | Freq: Two times a day (BID) | INTRAVENOUS | Status: DC

## 2020-08-19 MED ORDER — SODIUM CHLORIDE 0.9% FLUSH: 3.0000 mL | INTRAVENOUS | Status: DC | PRN

## 2020-08-19 MED ORDER — MIDAZOLAM HCL 2 MG/2ML IJ SOLN
INTRAMUSCULAR | Status: DC | PRN
  Administered 2020-08-19 (×2): 1 mg via INTRAVENOUS

## 2020-08-19 MED ORDER — SODIUM CHLORIDE 0.9 % IV SOLN: 250.0000 mL | INTRAVENOUS | Status: DC | PRN

## 2020-08-19 MED ORDER — LABETALOL HCL 5 MG/ML IV SOLN: 10.0000 mg | INTRAVENOUS | Status: DC | PRN

## 2020-08-19 MED ORDER — FENTANYL CITRATE (PF) 100 MCG/2ML IJ SOLN
INTRAMUSCULAR | Status: AC
  Filled 2020-08-19: qty 2

## 2020-08-19 MED ORDER — ASPIRIN 81 MG PO CHEW
81.0000 mg | CHEWABLE_TABLET | ORAL | Status: AC
  Administered 2020-08-19: 81 mg via ORAL
  Filled 2020-08-19: qty 1

## 2020-08-19 MED ORDER — LIDOCAINE HCL (PF) 1 % IJ SOLN
INTRAMUSCULAR | Status: DC | PRN
  Administered 2020-08-19: 2 mL

## 2020-08-19 MED ORDER — SODIUM CHLORIDE 0.9 % WEIGHT BASED INFUSION: 1.0000 mL/kg/h | INTRAVENOUS | Status: DC

## 2020-08-19 MED ORDER — MIDAZOLAM HCL 2 MG/2ML IJ SOLN
INTRAMUSCULAR | Status: AC
  Filled 2020-08-19: qty 2

## 2020-08-19 MED ORDER — ACETAMINOPHEN 325 MG PO TABS: 650.0000 mg | ORAL_TABLET | ORAL | Status: DC | PRN

## 2020-08-19 MED ORDER — HEPARIN SODIUM (PORCINE) 1000 UNIT/ML IJ SOLN
INTRAMUSCULAR | Status: DC | PRN
  Administered 2020-08-19: 3000 [IU] via INTRAVENOUS

## 2020-08-19 MED ORDER — LIDOCAINE HCL (PF) 1 % IJ SOLN
INTRAMUSCULAR | Status: AC
  Filled 2020-08-19: qty 30

## 2020-08-19 MED ORDER — FENTANYL CITRATE (PF) 100 MCG/2ML IJ SOLN
INTRAMUSCULAR | Status: DC | PRN
  Administered 2020-08-19 (×2): 25 ug via INTRAVENOUS

## 2020-08-19 MED ORDER — VERAPAMIL HCL 2.5 MG/ML IV SOLN
INTRAVENOUS | Status: AC
  Filled 2020-08-19: qty 2

## 2020-08-19 MED ORDER — SODIUM CHLORIDE 0.9 % WEIGHT BASED INFUSION
3.0000 mL/kg/h | INTRAVENOUS | Status: AC
  Administered 2020-08-19: 3 mL/kg/h via INTRAVENOUS

## 2020-08-19 MED ORDER — ASPIRIN 81 MG PO CHEW: 81.0000 mg | CHEWABLE_TABLET | Freq: Every day | ORAL | Status: DC

## 2020-08-19 MED ORDER — HEPARIN (PORCINE) IN NACL 1000-0.9 UT/500ML-% IV SOLN
INTRAVENOUS | Status: AC
  Filled 2020-08-19: qty 1000

## 2020-08-19 MED ORDER — IOHEXOL 350 MG/ML SOLN
INTRAVENOUS | Status: DC | PRN
  Administered 2020-08-19: 60 mL

## 2020-08-19 MED ORDER — HEPARIN SODIUM (PORCINE) 1000 UNIT/ML IJ SOLN
INTRAMUSCULAR | Status: AC
  Filled 2020-08-19: qty 1

## 2020-08-19 SURGICAL SUPPLY — 13 items
BAG SNAP BAND KOVER 36X36 (MISCELLANEOUS) ×1 IMPLANT
CATH 5FR JL3.5 JR4 ANG PIG MP (CATHETERS) ×1 IMPLANT
COVER DOME SNAP 22 D (MISCELLANEOUS) ×1 IMPLANT
COVER PRB 48X5XTLSCP FOLD TPE (BAG) IMPLANT
COVER PROBE 5X48 (BAG) ×2
DEVICE RAD TR BAND REGULAR (VASCULAR PRODUCTS) ×1 IMPLANT
GLIDESHEATH SLEND A-KIT 6F 22G (SHEATH) ×1 IMPLANT
GUIDEWIRE INQWIRE 1.5J.035X260 (WIRE) IMPLANT
INQWIRE 1.5J .035X260CM (WIRE) ×2
KIT HEART LEFT (KITS) ×2 IMPLANT
PACK CARDIAC CATHETERIZATION (CUSTOM PROCEDURE TRAY) ×2 IMPLANT
TRANSDUCER W/STOPCOCK (MISCELLANEOUS) ×2 IMPLANT
TUBING CIL FLEX 10 FLL-RA (TUBING) ×2 IMPLANT

## 2020-08-19 NOTE — CV Procedure (Signed)
   Luminal irregularities, proximal LAD, with up to 30% narrowing..  Patent distal LAD stent.  Widely patent circumflex stent.  Patent left main  Nondominant right coronary  Normal LV function and LVEDP

## 2020-08-19 NOTE — Discharge Instructions (Signed)
Resume apixaban at 8 PM today  ELEVATE RIGHT WRIST 24 HOURS  Radial Site Care  This sheet gives you information about how to care for yourself after your procedure. Your health care provider may also give you more specific instructions. If you have problems or questions, contact your health care provider. What can I expect after the procedure? After the procedure, it is common to have: Bruising and tenderness at the catheter insertion area. Follow these instructions at home: Medicines Take over-the-counter and prescription medicines only as told by your health care provider. Insertion site care Follow instructions from your health care provider about how to take care of your insertion site. Make sure you: Wash your hands with soap and water before you change your bandage (dressing). If soap and water are not available, use hand sanitizer. Change your dressing as told by your health care provider. Leave stitches (sutures), skin glue, or adhesive strips in place. These skin closures may need to stay in place for 2 weeks or longer. If adhesive strip edges start to loosen and curl up, you may trim the loose edges. Do not remove adhesive strips completely unless your health care provider tells you to do that. Check your insertion site every day for signs of infection. Check for: Redness, swelling, or pain. Fluid or blood. Pus or a bad smell. Warmth. Do not take baths, swim, or use a hot tub until your health care provider approves. You may shower 24-48 hours after the procedure, or as directed by your health care provider. Remove the dressing and gently wash the site with plain soap and water. Pat the area dry with a clean towel. Do not rub the site. That could cause bleeding. Do not apply powder or lotion to the site. Activity For 24 hours after the procedure, or as directed by your health care provider: Do not flex or bend the affected arm. Do not push or pull heavy objects with the  affected arm. Do not drive yourself home from the hospital or clinic. You may drive 24 hours after the procedure unless your health care provider tells you not to. Do not operate machinery or power tools. Do not lift anything that is heavier than 10 lb (4.5 kg), or the limit that you are told, until your health care provider says that it is safe. Ask your health care provider when it is okay to: Return to work or school. Resume usual physical activities or sports. Resume sexual activity.   General instructions If the catheter site starts to bleed, raise your arm and put firm pressure on the site. If the bleeding does not stop, get help right away. This is a medical emergency. If you went home on the same day as your procedure, a responsible adult should be with you for the first 24 hours after you arrive home. Keep all follow-up visits as told by your health care provider. This is important. Contact a health care provider if: You have a fever. You have redness, swelling, or yellow drainage around your insertion site. Get help right away if: You have unusual pain at the radial site. The catheter insertion area swells very fast. The insertion area is bleeding, and the bleeding does not stop when you hold steady pressure on the area. Your arm or hand becomes pale, cool, tingly, or numb. These symptoms may represent a serious problem that is an emergency. Do not wait to see if the symptoms will go away. Get medical help right away. Call  your local emergency services (911 in the [place] Do not drive yourself to the hospital. Summary After the procedure, it is common to have bruising and tenderness at the site. Follow instructions from your health care provider about how to take care of your radial site wound. Check the wound every day for signs of infection. Do not lift anything that is heavier than 10 lb (4.5 kg), or the limit that you are told, until your health care provider says that it is  safe. This information is not intended to replace advice given to you by your health care provider. Make sure you discuss any questions you have with your health care provider. Document Revised: 08/01/2017 Document Reviewed: 08/01/2017 Elsevier Patient Education  2021 [employer].

## 2020-08-19 NOTE — Interval H&P Note (Signed)
Cath Lab Visit (complete for each Cath Lab visit)  Clinical Evaluation Leading to the Procedure:   ACS: No.  Non-ACS:    Anginal Classification: CCS II  Anti-ischemic medical therapy: Minimal Therapy (1 class of medications)  Non-Invasive Test Results: No non-invasive testing performed  Prior CABG: No previous CABG      History and Physical Interval Note:  08/19/2020 9:47 AM  Brooke [name]  has presented today for surgery, with the diagnosis of cad.  The various methods of treatment have been discussed with the patient and family. After consideration of risks, benefits and other options for treatment, the patient has consented to  Procedure(s): LEFT HEART CATH AND CORONARY ANGIOGRAPHY (N/A) as a surgical intervention.  The patient's history has been reviewed, patient examined, no change in status, stable for surgery.  I have reviewed the patient's chart and labs.  Questions were answered to the patient's satisfaction.     Lyn Records III

## 2020-08-20 ENCOUNTER — Ambulatory Visit: Payer: Medicare Other | Admitting: Internal Medicine

## 2020-09-03 ENCOUNTER — Ambulatory Visit (INDEPENDENT_AMBULATORY_CARE_PROVIDER_SITE_OTHER): Payer: Medicare Other | Admitting: Cardiology

## 2020-09-03 ENCOUNTER — Encounter: Payer: Self-pay | Admitting: Cardiology

## 2020-09-03 ENCOUNTER — Other Ambulatory Visit: Payer: Self-pay

## 2020-09-03 VITALS — BP 130/62 | HR 57 | Ht 67.0 in | Wt 142.4 lb

## 2020-09-03 DIAGNOSIS — R0600 Dyspnea, unspecified: Secondary | ICD-10-CM

## 2020-09-03 DIAGNOSIS — I25118 Atherosclerotic heart disease of native coronary artery with other forms of angina pectoris: Secondary | ICD-10-CM | POA: Diagnosis not present

## 2020-09-03 DIAGNOSIS — Z712 Person consulting for explanation of examination or test findings: Secondary | ICD-10-CM | POA: Diagnosis not present

## 2020-09-03 DIAGNOSIS — Z8673 Personal history of transient ischemic attack (TIA), and cerebral infarction without residual deficits: Secondary | ICD-10-CM

## 2020-09-03 DIAGNOSIS — I25119 Atherosclerotic heart disease of native coronary artery with unspecified angina pectoris: Secondary | ICD-10-CM | POA: Diagnosis not present

## 2020-09-03 DIAGNOSIS — R5383 Other fatigue: Secondary | ICD-10-CM | POA: Diagnosis not present

## 2020-09-03 DIAGNOSIS — R0609 Other forms of dyspnea: Secondary | ICD-10-CM

## 2020-09-03 DIAGNOSIS — I480 Paroxysmal atrial fibrillation: Secondary | ICD-10-CM

## 2020-09-03 NOTE — Patient Instructions (Signed)

## 2020-09-03 NOTE — Progress Notes (Signed)
Cardiology Office Note:    Date:  09/03/2020   ID:  Brooke [name], DOB [birth year], MRN [id number]  PCP:  Brooke [name], Brooke Patricia, MD  Cardiologist:  Brooke Red, MD  Referring MD: Brooke [name], Estel*   CC: follow up  History of Present Illness:    Brooke [name] is a 86 y.o. female with a hx of CAD, hyperlipidemia, atrial fibrillation, atrial flutter, paroxysmal atrial tachycardia, TIA, hypertension who is seen for follow up today. I initially saw her 02/23/20 as a new consult at the request of Brooke [name], Brooke [name]* for the evaluation and management of increasing fatigue.  Cardiac history: history of CAD with stent placement (she believes 2 stents; see cath report below, likely these were the LAD and OM2 stents placed in 11/2016).   Able to access note from 09/22/19 from Dr. Weber [name] at [facility] Noted cardiac history summarized as below: CTI Flutter ablation 05/13/2018 Trialed on tikosyn, did not control her her atrial fibrillation. Noted to have conversion pauses. PVI/SVT ablation 05/16/2019 Atrial tachycardia noted post ablation 05/2019, had EP study On flecainide for brief paroxysmal atrial tachycardia seen on monitor. Amiodarone caused her to have palpitations in the past, but tolerating now.  Today: Continues to feel the same with fatigue. Heart rates have been in the upper 50s to 60. She is now on metoprolol 75 BID (was on 50 mg BID previously). She doesn't feel any benefit from the sertraline yet.She feels that the small dose of Xanax helps her more than anything.   We reviewed her cath results and total cardiac workup at length today. Discussed that fatigue does not appear to be from a cardiac cause.  Hasn't felt any atrial tachycardia in some time. Tolerating current medications. We did review that there is a balance with medications, especially beta blockers, where we want to manage the intermittent tachycardia without making her too bradycardic and worsening her  fatigue. She has an upcoming appt with Dr. Lalla Brothers. We discussed trialing a lower dose of metoprolol today, but we will defer changes to Dr. Lalla Brothers.  Denies chest pain, shortness of breath at rest or with normal exertion. No PND, orthopnea, change in LE edema or unexpected weight gain. No syncope or palpitations.  Past Medical History:  Diagnosis Date  . Atrial fibrillation (HCC)   . CAD (coronary artery disease)   . Glaucoma   . Hyperlipidemia   . OA (osteoarthritis) of hip   . Tachycardia     Past Surgical History:  Procedure Laterality Date  . ABLATION    . LEFT HEART CATH AND CORONARY ANGIOGRAPHY N/A 08/19/2020   Procedure: LEFT HEART CATH AND CORONARY ANGIOGRAPHY;  Surgeon: Brooke Records, MD;  Location: [facility];  Service: Cardiovascular;  Laterality: N/A;    Current Medications: Current Outpatient Medications on File Prior to Visit  Medication Sig  . acetaminophen (TYLENOL) 325 MG tablet Take 650 mg by mouth every 6 (six) hours as needed for mild pain.   Marland Kitchen ALPRAZolam (XANAX) 0.5 MG tablet Take 1 tablet (0.5 mg total) by mouth at bedtime as needed for anxiety. (Patient taking differently: Take 0.25 mg by mouth 2 (two) times daily.)  . amiodarone (PACERONE) 200 MG tablet Take 1 tablet (200 mg total) by mouth 2 (two) times daily for 10 days, THEN 1 tablet (200 mg total) daily. (Patient taking differently: Take 200 mg total daily.)  . amLODipine (NORVASC) 5 MG tablet Take 5 mg by mouth daily.   Marland Kitchen apixaban (ELIQUIS) 5 MG  TABS tablet Take 1 tablet (5 mg total) by mouth 2 (two) times daily.  Marland Kitchen aspirin 81 MG EC tablet Take 81 mg by mouth daily.   Marland Kitchen atorvastatin (LIPITOR) 40 MG tablet Take 40 mg by mouth daily.   . carboxymethylcellul-glycerin (OPTIVE) 0.5-0.9 % ophthalmic solution Place 1-2 drops into both eyes 3 (three) times daily as needed for dry eyes. Thera Tears  . Cholecalciferol 25 MCG (1000 UT) tablet Take 1,000 Units by mouth daily.   . hydrocortisone cream 1 %  Apply 1 application topically daily as needed for itching.  . latanoprost (XALATAN) 0.005 % ophthalmic solution INSTILL 1 DROP INTO BOTH  EYES AT BEDTIME (Patient taking differently: Place 1 drop into both eyes at bedtime.)  . losartan (COZAAR) 50 MG tablet Take 1 tablet (50 mg total) by mouth daily.  . metoprolol tartrate (LOPRESSOR) 50 MG tablet Take 1 tablet (50 mg total) by mouth 2 (two) times daily. (Patient taking differently: Take 75 mg by mouth 2 (two) times daily. Patient is taking 50 MG plus 25 MG daily)  . OXYGEN Inhale 2 L into the lungs daily as needed (if she feels bad).  . sertraline (ZOLOFT) 50 MG tablet Take 1 tablet (50 mg total) by mouth daily.  . dorzolamide-timolol (COSOPT) 22.3-6.8 MG/ML ophthalmic solution Place 1 drop into the right eye 2 (two) times daily.   No current facility-administered medications on file prior to visit.     Allergies:   Hydrocodone-acetaminophen   Social History   Tobacco Use  . Smoking status: Never Smoker  . Smokeless tobacco: Never Used  Substance Use Topics  . Alcohol use: Never  . Drug use: Never    Family History: family history includes Aortic stenosis in her sister; CAD in her father; CVA in her mother.  ROS:   Please see the history of present illness.  Additional pertinent ROS otherwise unremarkable.    EKGs/Labs/Other Studies Reviewed:    The following studies were reviewed today: Cath 08/19/20  Widely patent left main  Diffuse luminal irregularities proximal to distal LAD.  Ostial to proximal 35 to 40% narrowing.  Distal LAD stent is widely patent.  Less than 20% narrowing.  Codominant circumflex.  Second obtuse marginal stent is widely patent.  The distal margin contains eccentric 40% narrowing.  Right coronary is relatively small and codominant.  Normal left ventricular function.  Normal left ventricular end-diastolic pressure, 14 mmHg.  Estimated ejection fraction 60%.  RECOMMENDATIONS:   Resume apixaban  later this evening.  Echo 03/12/20 1. Left ventricular ejection fraction, by estimation, is 60 to 65%. The  left ventricle has normal function. The left ventricle has no regional  wall motion abnormalities. Left ventricular diastolic parameters are  consistent with Grade III diastolic  dysfunction (restrictive). Elevated left atrial pressure.  2. Right ventricular systolic function is normal. The right ventricular  size is normal. There is normal pulmonary artery systolic pressure.  3. Left atrial size was mildly dilated.  4. The mitral valve is normal in structure. Mild mitral valve  regurgitation. No evidence of mitral stenosis.  5. Tricuspid valve regurgitation is mild to moderate.  6. The aortic valve is tricuspid. Aortic valve regurgitation is mild.  Mild aortic valve sclerosis is present, with no evidence of aortic valve  stenosis.  7. The inferior vena cava is normal in size with greater than 50%  respiratory variability, suggesting right atrial pressure of 3 mmHg.  Stress test 03/23/20  Nuclear stress EF: 64%.  The left  ventricular ejection fraction is normal (55-65%).  There was no ST segment deviation noted during stress.  The study is normal.  This is a low risk study.   Normal stress nuclear study with no ischemia or infarction; gated EF 64% with normal wall motion.  Monitor 05/25/20 HR 52-197, average 71. Rare ventricular ectopy. 309 SVT episodes, longest lasting 16.9 seconds. Review of strips of SVT suggests AT.  Echo 05/10/2018 (care everywhere) Interpretation Summary  A complete two-dimensional transthoracic echocardiogram with color flow  Doppler and spectral Doppler was performed. The left ventricle is grossly  normal size.  There is proximal septal thickening of the elderly.  The left ventricular ejection fraction is normal (65-70%).  The left ventricular wall motion is normal.  The aortic valve is trileaflet.  Mild aortic sclerosis is present  with good valvular opening.  There is mild [1+] aortic regurgitation present.  Unable to adequately determine diastolic dysfunction.  There is moderate (2+) tricuspid regurgitation.  Right ventricular systolic pressure is normal.  There is mild (1+) mitral regurgitation.  The left atrium is moderately dilated.  The right atrium is moderately dilated.   Cardiac cath 11/23/2016 (care everywhere) PRESSURE DETERMINATIONS: Aortic pressure was 172/76 mmHg with a mean  pressure 114 mmHg. Left ankle pressure was 172/18 mmHg. There was no  pressure gradient noted on pullback acrossthe aortic valve.   CORONARY ANGIOGRAPHY: The coronary angiograms are technically  satisfactory.   LEFT MAIN: Left main coronary artery has luminal irregularities.   LEFT ANTERIOR DESCENDING: Left anterior descending coronary artery has  mild proximal to mid ectasia with proximal 30% narrowing. The vessel is  moderate in size and extends transapically. First, second, and third  diagonal branches are small with luminal irregularities. There are  sequential obstructive narrowings in the mid to distal vessel with maximal  narrowing of 80% to 90%.   CIRCUMFLEX: The circumflex system gives rise to a moderate size first  obtuse marginal branch with luminal irregularities. Second obtuse  marginal branch has critical 90% proximal narrowing. The distal  circumflex gives rise to a small to moderate size branch, which supplies a  posterolateral territory and has luminal irregularities.   RIGHT CORONARY ARTERY: The right coronary artery is a small caliber  codominant vessel with luminal irregularities.   CORONARY INTERVENTION: Following angiography, we proceeded with  intervention for the critical obstructive narrowings within the left  anterior descending coronary artery followed by the circumflex second  obtuse marginal branch.The patient received a weight-based Angiomax  bolus followed by an Angiomax  infusion. She also received 180 mg loading  dose of Brilinta.   The ostium of the left main was engaged with a 6-French XB 3.5 guiding  catheter. The area of culprit disease in the left anterior descending  coronary artery was crossed without difficulty with a 0.014-inch 190 cm  long BMW wire. Predilation was performed with a 2.0 x 12 mm Sprinter  Legend balloon inflated to 10 atmospheres for 20 seconds. We then  administered 200 mcg of intracoronary nitroglycerin. The stenosis was  stented with a 2.25 x 22 mm medicated Resolute Onyx stent deployed at 12  atmospheres for 30 seconds. The proximal and mid portion of the stent  were postdilated with a 2.5 x 15 mm Ames Trek balloon inflated to 12  atmospheres for 20 seconds.   Imaging following removal of intracoronary guidewire reveals an acceptable  result. Preprocedure stenosis was maximally 90%, postprocedure residual  was 0%. Pre- and post-procedure TIMI flow are grade  3. This was a type C  lesion.   INTERVENTION OF THE CIRCUMFLEX SECOND OBTUSE MARGINAL BRANCH: We then  directed the BMW wire down the second obtuse marginal branch.   Pre-dilatation was performed with a 2.0 x 12 mm Sprinter Legend balloon  inflated to 10 atmospheres for 20 seconds. We then stented the stenosis  with a 2.5 x 15 mm medicated Resolute Onyx stent, which was deployed at 12  atmospheres for 20 seconds.   Imaging following removal of intracoronary guidewire reveals an acceptable  result. Preprocedure stenosis was 90%, postprocedure residual was 0%.   Pre- and post-procedure TIMI flow are grade 3. This was a type C lesion.   FINAL DIAGNOSES:  1. Critical obstructive narrowing of the mid to distal left anterior  descending coronary artery, status post stenting with placement of a  medicated stent, with acceptable results.  2. Obstructive narrowing second obtuse marginal branch, status post  stenting with placement of a medicated stent with  acceptable results.   RECOMMENDATIONS: Continued post-procedure care with dual antiplatelet  therapy.   EKG:  EKG is personally reviewed.  The ekg ordered 08/13/20 demonstrates NSR at 68 bpm  Recent Labs: 04/08/2020: ALT 17; TSH 1.11 05/26/2020: Magnesium 2.4 08/13/2020: BUN 16; Creatinine, Ser 0.65; Hemoglobin 14.0; Platelets 197; Potassium 4.4; Sodium 139  Recent Lipid Panel No results found for: CHOL, TRIG, HDL, CHOLHDL, VLDL, LDLCALC, LDLDIRECT  Physical Exam:    VS:  BP 130/62   Pulse (!) 57   Ht 5\' 7"  (1.702 m)   Wt 142 lb 6.4 oz (64.6 kg)   SpO2 99%   BMI 22.30 kg/m     Wt Readings from Last 3 Encounters:  09/03/20 142 lb 6.4 oz (64.6 kg)  08/19/20 145 lb (65.8 kg)  08/13/20 142 lb (64.4 kg)    GEN: Well nourished, well developed in no acute distress HEENT: Normal, moist mucous membranes NECK: No JVD CARDIAC: regular rhythm, normal S1 and S2, no rubs or gallops. No murmur. VASCULAR: Radial and DP pulses 2+ bilaterally. No carotid bruits RESPIRATORY:  Clear to auscultation without rales, wheezing or rhonchi  ABDOMEN: Soft, non-tender, non-distended MUSCULOSKELETAL:  Ambulates independently SKIN: Warm and dry, no edema NEUROLOGIC:  Alert and oriented x 3. No focal neuro deficits noted. PSYCHIATRIC:  Normal affect   ASSESSMENT:    1. Fatigue, unspecified type   2. DOE (dyspnea on exertion)   3. Coronary artery disease involving native coronary artery of native heart with other form of angina pectoris (HCC)   4. Encounter to discuss test results   5. Paroxysmal atrial fibrillation (HCC)   6. History of TIA (transient ischemic attack)    PLAN:    Fatigue, dyspnea on exertion  -see echo, stress test, monitor, cath results above -no clear cardiac etiology of her symptoms.   History of CAD with prior stenting History of TIA Hyperlipidemia -given symptoms above, she underwent definitive evaluation with cath, which showed patent distal LAD stent and nonobstructive  disease -continue aspirin 81 mg daily, atorvastatin 40 mg daily -counseled on [name] flag warning signs that need immediate medical attention (has never had typical angina)  Atrial fibrillation, atrial flutter, atrial tachycardia: extensive history, see summary -given her CAD, we stopped flecainide, despite the fact that it worked well for her -on metoprolol tartrate, increased from 50 mg BID to 75 mg BID and now on amiodarone, followed by Dr. Lalla BrothersLambert -her heart rate today is 57, and she reports it is largely in the upper 50s.  Doesn't feel new symptoms. Recommended she discuss with Dr. Lalla Brothers dropping back to 50 mg BID to see if slightly higher heart rate helps her fatigue now that she is on amiodarone -continue apixaban 5 mg BID (meets only age criteria for reduced dose) -CHA2DS2/VAS Stroke Risk Points= 5  Hypertension: -continue losartan  Cardiac risk counseling and prevention recommendations: -recommend heart healthy/Mediterranean diet, with whole grains, fruits, vegetable, fish, lean meats, nuts, and olive oil. Limit salt. -recommend moderate walking, 3-5 times/week for 30-50 minutes each session. Aim for at least 150 minutes.week. Goal should be pace of 3 miles/hours, or walking 1.5 miles in 30 minutes -recommend avoidance of tobacco products. Avoid excess alcohol.  Plan for follow up: 1 year or sooner as needed  Brooke Red, MD, PhD, [facility]  [facility]   Medication Adjustments/Labs and Tests Ordered: Current medicines are reviewed at length with the patient today.  Concerns regarding medicines are outlined above.  No orders of the defined types were placed in this encounter.  No orders of the defined types were placed in this encounter.   Patient Instructions  Medication Instructions:  Your Physician recommend you continue on your current medication as directed.    *If you need a refill on your cardiac medications before your next appointment, please  call your pharmacy*   Lab Work: None   Testing/Procedures: None   Follow-Up: At [facility], you and your health needs are our priority.  As part of our continuing mission to provide you with exceptional heart care, we have created designated Provider Care Teams.  These Care Teams include your primary Cardiologist (physician) and Advanced Practice Providers (APPs -  Physician Assistants and Nurse Practitioners) who all work together to provide you with the care you need, when you need it.  We recommend signing up for the patient portal called "MyChart".  Sign up information is provided on this After Visit Summary.  MyChart is used to connect with patients for Virtual Visits (Telemedicine).  Patients are able to view lab/test results, encounter notes, upcoming appointments, etc.  Non-urgent messages can be sent to your provider as well.   To learn more about what you can do with MyChart, go to [email or web address].    Your next appointment:   1 year(s)  The format for your next appointment:   In Person  Provider:   Jodelle Red, MD     Signed, Brooke Red, MD PhD 09/03/2020  [facility]

## 2020-09-07 ENCOUNTER — Telehealth: Payer: Self-pay

## 2020-09-07 NOTE — Telephone Encounter (Signed)
Patient assistance forms for Eliquis 5 mg BID faxed to [employer].  Awaiting approval.

## 2020-09-08 ENCOUNTER — Ambulatory Visit: Payer: Medicare Other | Admitting: Internal Medicine

## 2020-09-10 ENCOUNTER — Other Ambulatory Visit: Payer: Self-pay | Admitting: Internal Medicine

## 2020-09-10 DIAGNOSIS — F411 Generalized anxiety disorder: Secondary | ICD-10-CM

## 2020-09-10 MED ORDER — ALPRAZOLAM 0.5 MG PO TABS: 0.5000 mg | ORAL_TABLET | Freq: Two times a day (BID) | ORAL | 2 refills | Status: DC | PRN

## 2020-09-10 NOTE — Telephone Encounter (Signed)
Patient is calling and requesting a refill and dosage change for ALPRAZolam (XANAX) 0.5 MG tablet. Pt is requesting if she can change the dosage to 2 a day and if it can be sent to Pharmacy  [id number] - [place], [place] - [address] AT [facility] [address] [facility] [address]  Phone:  [phone] Fax:  [phone]  CB is [phone]

## 2020-09-10 NOTE — Telephone Encounter (Signed)
Would you like an office visit or okay to change dosage?

## 2020-09-10 NOTE — Telephone Encounter (Signed)
Ok to give 60 tabs a month to take twice daily of xanax 0.5 mg.

## 2020-09-14 ENCOUNTER — Ambulatory Visit (INDEPENDENT_AMBULATORY_CARE_PROVIDER_SITE_OTHER): Payer: Medicare Other | Admitting: Cardiology

## 2020-09-14 ENCOUNTER — Encounter: Payer: Self-pay | Admitting: Cardiology

## 2020-09-14 ENCOUNTER — Other Ambulatory Visit: Payer: Self-pay

## 2020-09-14 VITALS — BP 146/72 | HR 65 | Ht 67.0 in | Wt 146.2 lb

## 2020-09-14 DIAGNOSIS — I471 Supraventricular tachycardia: Secondary | ICD-10-CM | POA: Diagnosis not present

## 2020-09-14 DIAGNOSIS — I10 Essential (primary) hypertension: Secondary | ICD-10-CM | POA: Diagnosis not present

## 2020-09-14 DIAGNOSIS — I480 Paroxysmal atrial fibrillation: Secondary | ICD-10-CM | POA: Diagnosis not present

## 2020-09-14 NOTE — Progress Notes (Signed)
Electrophysiology Office Follow up Visit Note:    Date:  09/14/2020   ID:  Brooke [name], DOB 1935-01-26, MRN [id number]  PCP:  Philip Aspen, Limmie Patricia, MD  [facility] Cardiologist:  Jodelle Red, MD  [facility] Electrophysiologist:  Lanier Prude, MD    Interval History:    Brooke [name] is a 86 y.o. female who presents for a follow up visit. They were last seen in clinic June 16, 2020.  She is being managed for a history of atrial tachycardia.  She has had multiple prior ablations.  I have recently restarted her amiodarone and she is currently taking 200 mg by mouth once daily.  She tells me that on the amiodarone she has not noticed her atrial tachycardia resume.  She is tolerating the current dose.  She also takes metoprolol 37.5 mg twice daily.  She tells me the lowest her heart rate gets at home is about 50 bpm.  No syncope or presyncope.     Past Medical History:  Diagnosis Date  . Atrial fibrillation (HCC)   . CAD (coronary artery disease)   . Glaucoma   . Hyperlipidemia   . OA (osteoarthritis) of hip   . Tachycardia     Past Surgical History:  Procedure Laterality Date  . ABLATION    . LEFT HEART CATH AND CORONARY ANGIOGRAPHY N/A 08/19/2020   Procedure: LEFT HEART CATH AND CORONARY ANGIOGRAPHY;  Surgeon: Lyn Records, MD;  [facility]: [facility];  Service: Cardiovascular;  Laterality: N/A;    Current Medications: Current Meds  Medication Sig  . acetaminophen (TYLENOL) 325 MG tablet Take 650 mg by mouth every 6 (six) hours as needed for mild pain.   Marland Kitchen ALPRAZolam (XANAX) 0.5 MG tablet Take 1 tablet (0.5 mg total) by mouth 2 (two) times daily as needed for anxiety.  Marland Kitchen amiodarone (PACERONE) 200 MG tablet Take 200 mg by mouth daily.  Marland Kitchen amLODipine (NORVASC) 5 MG tablet Take 5 mg by mouth daily.   Marland Kitchen apixaban (ELIQUIS) 5 MG TABS tablet Take 1 tablet (5 mg total) by mouth 2 (two) times daily.  Marland Kitchen aspirin 81 MG EC tablet Take 81 mg by mouth daily.    Marland Kitchen atorvastatin (LIPITOR) 40 MG tablet Take 40 mg by mouth daily.   . carboxymethylcellul-glycerin (OPTIVE) 0.5-0.9 % ophthalmic solution Place 1-2 drops into both eyes 3 (three) times daily as needed for dry eyes. Thera Tears  . Cholecalciferol 25 MCG (1000 UT) tablet Take 1,000 Units by mouth daily.   . dorzolamide-timolol (COSOPT) 22.3-6.8 MG/ML ophthalmic solution Place 1 drop into the right eye 2 (two) times daily.  . hydrocortisone cream 1 % Apply 1 application topically daily as needed for itching.  . latanoprost (XALATAN) 0.005 % ophthalmic solution INSTILL 1 DROP INTO BOTH  EYES AT BEDTIME (Patient taking differently: INSTILL 1 DROP INTO BOTH  EYES AT BEDTIME)  . losartan (COZAAR) 50 MG tablet Take 1 tablet (50 mg total) by mouth daily.  . metoprolol tartrate (LOPRESSOR) 50 MG tablet Take 1 tablet (50 mg total) by mouth 2 (two) times daily.  . OXYGEN Inhale 2 L into the lungs daily as needed (if she feels bad).  . sertraline (ZOLOFT) 50 MG tablet Take 1 tablet (50 mg total) by mouth daily.     Allergies:   Hydrocodone-acetaminophen   Social History   Socioeconomic History  . Marital status: Married    Spouse name: Not on file  . Number of children: Not on file  .  Years of education: Not on file  . Highest education level: Not on file  Occupational History  . Not on file  Tobacco Use  . Smoking status: Never Smoker  . Smokeless tobacco: Never Used  Substance and Sexual Activity  . Alcohol use: Never  . Drug use: Never  . Sexual activity: Not on file  Other Topics Concern  . Not on file  Social History Narrative  . Not on file   Social Determinants of Health   Financial Resource Strain: Not on file  Food Insecurity: Not on file  Transportation Needs: Not on file  Physical Activity: Not on file  Stress: Not on file  Social Connections: Not on file     Family History: The patient's family history includes Aortic stenosis in her sister; CAD in her father; CVA in  her mother.  ROS:   Please see the history of present illness.    All other systems reviewed and are negative.  EKGs/Labs/Other Studies Reviewed:    The following studies were reviewed today:  08/19/2020 left heart cath  Widely patent left main  Diffuse luminal irregularities proximal to distal LAD.  Ostial to proximal 35 to 40% narrowing.  Distal LAD stent is widely patent.  Less than 20% narrowing.  Codominant circumflex.  Second obtuse marginal stent is widely patent.  The distal margin contains eccentric 40% narrowing.  Right coronary is relatively small and codominant.  Normal left ventricular function.  Normal left ventricular end-diastolic pressure, 14 mmHg.  Estimated ejection fraction 60%.    EKG:  The ekg ordered today demonstrates sinus rhythm with a ventricular rate of 65 bpm.  No PACs or PVCs.  PR interval 200 ms.  Recent Labs: 04/08/2020: ALT 17; TSH 1.11 05/26/2020: Magnesium 2.4 08/13/2020: BUN 16; Creatinine, Ser 0.65; Hemoglobin 14.0; Platelets 197; Potassium 4.4; Sodium 139  Recent Lipid Panel No results found for: CHOL, TRIG, HDL, CHOLHDL, VLDL, LDLCALC, LDLDIRECT  Physical Exam:    VS:  BP (!) 146/72   Pulse 65   Ht 5\' 7"  (1.702 m)   Wt 146 lb 3.2 oz (66.3 kg)   SpO2 94%   BMI 22.90 kg/m     Wt Readings from Last 3 Encounters:  09/14/20 146 lb 3.2 oz (66.3 kg)  09/03/20 142 lb 6.4 oz (64.6 kg)  08/19/20 145 lb (65.8 kg)     GEN:  Well nourished, well developed in no acute distress HEENT: Normal NECK: No JVD; No carotid bruits LYMPHATICS: No lymphadenopathy CARDIAC: RRR, no murmurs, rubs, gallops RESPIRATORY:  Clear to auscultation without rales, wheezing or rhonchi  ABDOMEN: Soft, non-tender, non-distended MUSCULOSKELETAL:  No edema; No deformity  SKIN: Warm and dry NEUROLOGIC:  Alert and oriented x 3 PSYCHIATRIC:  Normal affect   ASSESSMENT:    No diagnosis found. PLAN:    In order of problems listed above:  1. Paroxysmal atrial  fibrillation Maintaining sinus rhythm on amiodarone 200 mg by mouth once daily.  On Eliquis 5 mg twice daily for stroke prophylaxis.  2.  Atrial tachycardia Previously symptomatic.  Amiodarone 200 mg by mouth once daily.  We will recheck a complete metabolic panel, TSH and free T4 today.  Follow-up 6 months.    Medication Adjustments/Labs and Tests Ordered: Current medicines are reviewed at length with the patient today.  Concerns regarding medicines are outlined above.  No orders of the defined types were placed in this encounter.  No orders of the defined types were placed in this encounter.  Signed, Steffanie Dunn, MD, [facility]  09/14/2020 2:31 PM    Electrophysiology [facility]

## 2020-09-14 NOTE — Telephone Encounter (Signed)
Called pt to inform we received a denial letter from [facility] Patient Assistance due to not receiving 3% out of pocket prescription expenses.  Left message to call back.

## 2020-09-14 NOTE — Patient Instructions (Addendum)
Medication Instructions:  Your physician recommends that you continue on your current medications as directed. Please refer to the Current Medication list given to you today.  Labwork: None ordered.  Testing/Procedures: None ordered.  Follow-Up: Your physician wants you to follow-up in: 6 months with Dr. Lambert.   You will receive a reminder letter in the mail two months in advance. If you don't receive a letter, please call our office to schedule the follow-up appointment.   Any Other Special Instructions Will Be Listed Below (If Applicable).  If you need a refill on your cardiac medications before your next appointment, please call your pharmacy.   

## 2020-09-14 NOTE — Telephone Encounter (Signed)
Patient returning call.

## 2020-09-14 NOTE — Telephone Encounter (Signed)
Pt made aware and voiced she will gather information to have nurse fax to patient assistance.

## 2020-09-15 LAB — COMPREHENSIVE METABOLIC PANEL
ALT: 28 IU/L (ref 0–32)
AST: 25 IU/L (ref 0–40)
Albumin/Globulin Ratio: 2 (ref 1.2–2.2)
Albumin: 4.2 g/dL (ref 3.6–4.6)
Alkaline Phosphatase: 122 IU/L — ABNORMAL HIGH (ref 44–121)
BUN/Creatinine Ratio: 23 (ref 12–28)
BUN: 14 mg/dL (ref 8–27)
Bilirubin Total: 0.5 mg/dL (ref 0.0–1.2)
CO2: 26 mmol/L (ref 20–29)
Calcium: 9.3 mg/dL (ref 8.7–10.3)
Chloride: 104 mmol/L (ref 96–106)
Creatinine, Ser: 0.62 mg/dL (ref 0.57–1.00)
Globulin, Total: 2.1 g/dL (ref 1.5–4.5)
Glucose: 105 mg/dL — ABNORMAL HIGH (ref 65–99)
Potassium: 4.2 mmol/L (ref 3.5–5.2)
Sodium: 143 mmol/L (ref 134–144)
Total Protein: 6.3 g/dL (ref 6.0–8.5)
eGFR: 88 mL/min/{1.73_m2} (ref >59)

## 2020-09-15 LAB — TSH: TSH: 1.55 u[IU]/mL (ref 0.450–4.500)

## 2020-09-15 LAB — T4, FREE: Free T4: 1.36 ng/dL (ref 0.82–1.77)

## 2020-09-19 ENCOUNTER — Encounter: Payer: Self-pay | Admitting: Cardiology

## 2020-09-27 ENCOUNTER — Other Ambulatory Visit: Payer: Self-pay

## 2020-09-28 ENCOUNTER — Encounter: Payer: Self-pay | Admitting: Internal Medicine

## 2020-09-28 ENCOUNTER — Ambulatory Visit (INDEPENDENT_AMBULATORY_CARE_PROVIDER_SITE_OTHER): Payer: Medicare Other | Admitting: Internal Medicine

## 2020-09-28 VITALS — BP 130/80 | HR 60 | Temp 97.6°F | Wt 144.2 lb

## 2020-09-28 DIAGNOSIS — M160 Bilateral primary osteoarthritis of hip: Secondary | ICD-10-CM

## 2020-09-28 DIAGNOSIS — I10 Essential (primary) hypertension: Secondary | ICD-10-CM | POA: Diagnosis not present

## 2020-09-28 DIAGNOSIS — F411 Generalized anxiety disorder: Secondary | ICD-10-CM

## 2020-09-28 DIAGNOSIS — I480 Paroxysmal atrial fibrillation: Secondary | ICD-10-CM | POA: Diagnosis not present

## 2020-09-28 DIAGNOSIS — I25118 Atherosclerotic heart disease of native coronary artery with other forms of angina pectoris: Secondary | ICD-10-CM

## 2020-09-28 DIAGNOSIS — I25119 Atherosclerotic heart disease of native coronary artery with unspecified angina pectoris: Secondary | ICD-10-CM

## 2020-09-28 MED ORDER — SERTRALINE HCL 100 MG PO TABS: 100.0000 mg | ORAL_TABLET | Freq: Every day | ORAL | 1 refills | Status: DC

## 2020-09-28 NOTE — Progress Notes (Signed)
Established Patient Office Visit     This visit occurred during the SARS-CoV-2 public health emergency.  Safety protocols were in place, including screening questions prior to the visit, additional usage of staff PPE, and extensive cleaning of exam room while observing appropriate contact time as indicated for disinfecting solutions.    CC/Reason for Visit: Follow-up chronic conditions  HPI: Brooke [name] is a 86 y.o. female who is coming in today for the above mentioned reasons. Past Medical History is significant for: Coronary artery disease status post recent cath in February, hyperlipidemia, hypertension, paroxysmal atrial tachycardia and possibly atrial fibrillation.  She continues to complain of fatigue and weakness, this has been an ongoing issue with her.  He supposed to be on 50 mg twice daily of metoprolol but she is only doing 37.5 twice a day.  She wonders if we can increase her Zoloft from 50 to 100 mg to see if this helps.  She has bilateral hip OA, she is status post a right hip replacement.  Her left hip is starting to bother her and affect her quality of life, she is wondering about an orthopedist referral.  She is having difficulty affording the cost of Eliquis.  Cardiology gave her all the samples they had available, she no longer qualifies for medication assistance.  She was told by cardiology that she could be transitioned over to warfarin, however she is hesitant due to all the initial required visits for INR checks.   Past Medical/Surgical History: Past Medical History:  Diagnosis Date  . Atrial fibrillation (HCC)   . CAD (coronary artery disease)   . Glaucoma   . Hyperlipidemia   . OA (osteoarthritis) of hip   . Tachycardia     Past Surgical History:  Procedure Laterality Date  . ABLATION    . LEFT HEART CATH AND CORONARY ANGIOGRAPHY N/A 08/19/2020   Procedure: LEFT HEART CATH AND CORONARY ANGIOGRAPHY;  Surgeon: Lyn Records, MD;  [facility];  Service: Cardiovascular;  Laterality: N/A;    Social History:  reports that she has never smoked. She has never used smokeless tobacco. She reports that she does not drink alcohol and does not use drugs.  Allergies: Allergies  Allergen Reactions  . Hydrocodone-Acetaminophen Anaphylaxis    Family History:  Family History  Problem Relation Age of Onset  . CVA Mother   . CAD Father   . Aortic stenosis Sister      Current Outpatient Medications:  .  acetaminophen (TYLENOL) 325 MG tablet, Take 650 mg by mouth every 6 (six) hours as needed for mild pain. , Disp: , Rfl:  .  ALPRAZolam (XANAX) 0.5 MG tablet, Take 1 tablet (0.5 mg total) by mouth 2 (two) times daily as needed for anxiety., Disp: 60 tablet, Rfl: 2 .  amiodarone (PACERONE) 200 MG tablet, Take 200 mg by mouth daily., Disp: , Rfl:  .  amLODipine (NORVASC) 5 MG tablet, Take 5 mg by mouth daily. , Disp: , Rfl:  .  apixaban (ELIQUIS) 5 MG TABS tablet, Take 1 tablet (5 mg total) by mouth 2 (two) times daily., Disp: 180 tablet, Rfl: 0 .  aspirin 81 MG EC tablet, Take 81 mg by mouth daily. , Disp: , Rfl:  .  atorvastatin (LIPITOR) 40 MG tablet, Take 40 mg by mouth daily. , Disp: , Rfl:  .  carboxymethylcellul-glycerin (OPTIVE) 0.5-0.9 % ophthalmic solution, Place 1-2 drops into both eyes 3 (three) times daily as needed for  dry eyes. Thera Tears, Disp: , Rfl:  .  Cholecalciferol 25 MCG (1000 UT) tablet, Take 1,000 Units by mouth daily. , Disp: , Rfl:  .  dorzolamide-timolol (COSOPT) 22.3-6.8 MG/ML ophthalmic solution, Place 1 drop into the right eye 2 (two) times daily., Disp: , Rfl:  .  hydrocortisone cream 1 %, Apply 1 application topically daily as needed for itching., Disp: , Rfl:  .  latanoprost (XALATAN) 0.005 % ophthalmic solution, INSTILL 1 DROP INTO BOTH  EYES AT BEDTIME (Patient taking differently: INSTILL 1 DROP INTO BOTH  EYES AT BEDTIME), Disp: 5 mL, Rfl: 5 .  losartan (COZAAR) 50 MG tablet, Take 1 tablet (50 mg  total) by mouth daily., Disp: 90 tablet, Rfl: 0 .  metoprolol tartrate (LOPRESSOR) 50 MG tablet, Take 1 tablet (50 mg total) by mouth 2 (two) times daily., Disp: 60 tablet, Rfl: 0 .  OXYGEN, Inhale 2 L into the lungs daily as needed (if she feels bad)., Disp: , Rfl:  .  sertraline (ZOLOFT) 100 MG tablet, Take 1 tablet (100 mg total) by mouth daily., Disp: 90 tablet, Rfl: 1  Review of Systems: Constitutional: Denies fever, chills, diaphoresis, appetite  and fatigue.  HEENT: Denies photophobia, eye pain, redness, hearing loss, ear pain, congestion, sore throat, rhinorrhea, sneezing, mouth sores, trouble swallowing, neck pain, neck stiffness and tinnitus.   Respiratory: Denies SOB, DOE, cough, chest tightness,  and wheezing.   Cardiovascular: Denies chest pain, palpitations and leg swelling.  Gastrointestinal: Denies nausea, vomiting, abdominal pain, diarrhea,  blood in stool and abdominal distention.  Genitourinary: Denies dysuria, urgency, frequency, hematuria, flank pain and difficulty urinating.  Endocrine: Denies: hot or cold intolerance, sweats, changes in hair or nails, polyuria, polydipsia. Musculoskeletal: Denies myalgias, back pain, joint swelling, arthralgias and gait problem.  Skin: Denies pallor, rash and wound.  Neurological: Denies dizziness, seizures, syncope, weakness, light-headedness, numbness and headaches.  Hematological: Denies adenopathy. Easy bruising, personal or family bleeding history  Psychiatric/Behavioral: Denies suicidal ideation, mood changes, confusion, nervousness and agitation    Physical Exam: Vitals:   09/28/20 1029  BP: 130/80  Pulse: 60  Temp: 97.6 F (36.4 C)  TempSrc: Oral  SpO2: 97%  Weight: 144 lb 3.2 oz (65.4 kg)    Body mass index is 22.58 kg/m.   Constitutional: NAD, calm, comfortable Eyes: PERRL, lids and conjunctivae normal ENMT: Mucous membranes are moist.  Respiratory: clear to auscultation bilaterally, no wheezing, no crackles.  Normal respiratory effort. No accessory muscle use.  Cardiovascular: Regular rate and rhythm, no murmurs / rubs / gallops. No extremity edema. Neurologic: Grossly intact and nonfocal Psychiatric: Normal judgment and insight. Alert and oriented x 3. Normal mood.    Impression and Plan:  Coronary artery disease involving native coronary artery of native heart with other form of angina pectoris [facility]) Essential hypertension Paroxysmal atrial fibrillation (HCC) -Followed by cardiology and stable with recent heart cath.  Osteoarthritis of both hips, unspecified osteoarthritis type  - Plan: Ambulatory referral to Orthopedic Surgery  GAD (generalized anxiety disorder)  -Increase Zoloft from 50 to 100 mg, return in 6 weeks for follow-up.    Patient Instructions  -Nice seeing you today!!  -Increase zoloft to 100 mg at bedtime.  -Schedule follow up in 4 months.     Chaya Jan, MD [facility]

## 2020-09-28 NOTE — Patient Instructions (Signed)
-  Nice seeing you today!!  -Increase zoloft to 100 mg at bedtime.  -Schedule follow up in 4 months.

## 2020-10-01 ENCOUNTER — Telehealth: Payer: Self-pay | Admitting: Cardiology

## 2020-10-01 DIAGNOSIS — I480 Paroxysmal atrial fibrillation: Secondary | ICD-10-CM

## 2020-10-01 MED ORDER — APIXABAN 5 MG PO TABS: 5.0000 mg | ORAL_TABLET | Freq: Two times a day (BID) | ORAL | 0 refills | Status: DC

## 2020-10-01 NOTE — Telephone Encounter (Signed)
Prescription refill request for Eliquis received. Indication: a fib Last office visit: 09/14/20 Scr: 0.62 Age: 86 Weight: 65 kg

## 2020-10-01 NOTE — Telephone Encounter (Signed)
*  STAT* If patient is at the pharmacy, call can be transferred to refill team.   1. Which medications need to be refilled? (please list name of each medication and dose if known) apixaban (ELIQUIS) 5 MG TABS tablet  2. Which pharmacy/location (including street and city if local pharmacy) is medication to be sent to? [facility] - [place],  - [address], [address]  3. Do they need a 30 day or 90 day supply? 90 day supply

## 2020-10-05 MED ORDER — APIXABAN 5 MG PO TABS: 5.0000 mg | ORAL_TABLET | Freq: Two times a day (BID) | ORAL | 1 refills | Status: DC

## 2020-10-05 NOTE — Telephone Encounter (Signed)
Prescription refill request for Eliquis received. Indication: a fib Last office visit: 3/8/22w/lambert Scr: 0.62 Age: 86 Weight: 65 kg

## 2020-10-05 NOTE — Addendum Note (Signed)
Addended by: Eather Colas on: 10/05/2020 01:12 PM   Modules accepted: Orders

## 2020-10-11 ENCOUNTER — Ambulatory Visit (INDEPENDENT_AMBULATORY_CARE_PROVIDER_SITE_OTHER): Payer: Medicare Other | Admitting: Orthopaedic Surgery

## 2020-10-11 ENCOUNTER — Encounter: Payer: Self-pay | Admitting: Orthopaedic Surgery

## 2020-10-11 ENCOUNTER — Other Ambulatory Visit: Payer: Self-pay

## 2020-10-11 ENCOUNTER — Ambulatory Visit (INDEPENDENT_AMBULATORY_CARE_PROVIDER_SITE_OTHER): Payer: Medicare Other

## 2020-10-11 VITALS — Ht 67.0 in | Wt 144.2 lb

## 2020-10-11 DIAGNOSIS — M25552 Pain in left hip: Secondary | ICD-10-CM

## 2020-10-11 DIAGNOSIS — M25551 Pain in right hip: Secondary | ICD-10-CM | POA: Diagnosis not present

## 2020-10-11 DIAGNOSIS — I25119 Atherosclerotic heart disease of native coronary artery with unspecified angina pectoris: Secondary | ICD-10-CM | POA: Diagnosis not present

## 2020-10-11 NOTE — Progress Notes (Signed)
Office Visit Note   Patient: Brooke [name]           Date of Birth: 11-Mar-1935           MRN: [id number] Visit Date: 10/11/2020              Requested by: Philip Aspen, Limmie Patricia, MD [address] [place],  [place] [address] PCP: Philip Aspen, Limmie Patricia, MD   Assessment & Plan: Visit Diagnoses:  1. Pain in left hip   2. Pain in right hip     Plan: She would like to consider an intra-articular steroid injection under radiographic guidance of a steroid in her left hip joint and I agree with this treatment plan.  We will work on getting that set up with one of my partners.  This would be under fluoroscopy or ultrasound.  I would not recommend surgery for her left hip given her comorbidities and the fact that this would need to be a staged procedure.  The hardware is quite extensive in her left hip and has been there for significant mount of time.  That may present significant difficulties with removing that and then eventually performing hip replacement.  I would not do that in the same setting.  Right now she is not injured in surgery and I think it is a good idea given her situation.  We will work on setting her for that injection and she she knows that she should wait at least 4 months or so between injections.  When she has the injection by one of my partners follow-up can be as needed.  All questions and concerns were answered and addressed.  Follow-Up Instructions: Return if symptoms worsen or fail to improve.   Orders:  Orders Placed This Encounter  Procedures  . XR HIPS BILAT W OR W/O PELVIS 2V   No orders of the defined types were placed in this encounter.     Procedures: No procedures performed   Clinical Data: No additional findings.   Subjective: Chief Complaint  Patient presents with  . Left Hip - Pain  . Right Hip - Pain  The patient is an 86 year old female who comes in for evaluation treatment of left hip pain is been hurting for many years now.   She actually has a history of a right hip replacement.  She says she has had 2 surgeries in her left hip over the years many years ago with the last one being I believe in the late 80s or early 90s on that left hip.  There is retained plate and screws.  She does ambulate with a cane.  She has daily hip pain.  Some is in the groin and some is down the leg.  She is on Eliquis and has significant comorbidities.  Her husband is with her as well.  She mobilizes slowly.  The pain does definitely affect her mobility and her quality of life.  HPI  Review of Systems Today she denies any headache, chest pain, shortness of breath, fever, chills, nausea, vomiting  Objective: Vital Signs: Ht 5\' 7"  (1.702 m)   Wt 144 lb 3.2 oz (65.4 kg)   BMI 22.58 kg/m   Physical Exam She is alert and orient x3 and in no acute distress Ortho Exam Examination of her left hip shows pain and stiffness with internal and external rotation.  It is not severe but is definitely moderate.  There is no blocks to rotation but there is obvious pain in  the groin and stiffness with rotating the hip. Specialty Comments:  No specialty comments available.  Imaging: XR HIPS BILAT W OR W/O PELVIS 2V  Result Date: 10/11/2020 An AP pelvis and lateral left hip shows hardware in both hips.  There is a right total hip arthroplasty with no complicating features.  There is dynamic compression screw and plate with multiple screws in the left proximal femur with no complicating features of the hardware.  There is severe end-stage arthritis of the left hip joint with complete loss of joint space, cystic and sclerotic changes as well as osteophytes.    PMFS History: Patient Active Problem List   Diagnosis Date Noted  . History of TIA (transient ischemic attack) 04/19/2020  . Essential hypertension 04/19/2020  . CAD (coronary artery disease)   . Atrial fibrillation (HCC)   . Hyperlipidemia   . Glaucoma   . OA (osteoarthritis) of hip   . 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 Atrial fibrillation (HCC)   . CAD (coronary artery disease)   . Glaucoma   . Hyperlipidemia   . OA (osteoarthritis) of hip   . Tachycardia     Family History  Problem Relation Age of Onset  . CVA Mother   . CAD Father   . Aortic stenosis Sister     Past Surgical History:  Procedure Laterality Date  . ABLATION    . LEFT HEART CATH AND CORONARY ANGIOGRAPHY N/A 08/19/2020   Procedure: LEFT HEART CATH AND CORONARY ANGIOGRAPHY;  Surgeon: Lyn Records, MD;  [facility]: [facility];  Service: Cardiovascular;  Laterality: N/A;   Social History   Occupational History  . Not on file  Tobacco Use  . Smoking status: Never Smoker  . Smokeless tobacco: Never Used  Substance and Sexual Activity  . Alcohol use: Never  . Drug use: Never  . Sexual activity: Not on file

## 2020-10-15 ENCOUNTER — Telehealth: Payer: Self-pay | Admitting: Cardiology

## 2020-10-15 NOTE — Telephone Encounter (Signed)
Patient called it to see if it okay for him to get the cortisone shot. Please advise

## 2020-10-15 NOTE — Telephone Encounter (Signed)
Returned call to patient-ortho suggested cortisone shot in her hip.   Concerned about doing this with her current medications and history of Afib/ablation.  She is unsure if they need her to hold Eliquis.  Request clearance form be sent with procedure details as well as medication instructions.  Advised once received will be addressed by provider.    Patient verbalized understanding.

## 2020-10-20 ENCOUNTER — Telehealth: Payer: Self-pay

## 2020-10-20 ENCOUNTER — Other Ambulatory Visit: Payer: Self-pay

## 2020-10-20 NOTE — Telephone Encounter (Signed)
I see a message in this patient's chart regarding a clearance form. We never received this. We do not have patients hold anticoagulation or any other medications for hip injections.

## 2020-10-20 NOTE — Telephone Encounter (Signed)
Patient called regarding injection, she wants to know if it is safe for her to get the cortisone injection while she is having heart issues such as irregular heart beat patient is requesting a call back:[phone]

## 2020-10-21 ENCOUNTER — Encounter: Payer: Self-pay | Admitting: Internal Medicine

## 2020-10-21 ENCOUNTER — Ambulatory Visit (INDEPENDENT_AMBULATORY_CARE_PROVIDER_SITE_OTHER): Payer: Medicare Other | Admitting: Internal Medicine

## 2020-10-21 VITALS — BP 140/70 | HR 60 | Temp 98.4°F | Wt 143.0 lb

## 2020-10-21 DIAGNOSIS — R519 Headache, unspecified: Secondary | ICD-10-CM | POA: Diagnosis not present

## 2020-10-21 DIAGNOSIS — I25119 Atherosclerotic heart disease of native coronary artery with unspecified angina pectoris: Secondary | ICD-10-CM

## 2020-10-21 NOTE — Progress Notes (Signed)
Established Patient Office Visit     This visit occurred during the SARS-CoV-2 public health emergency.  Safety protocols were in place, including screening questions prior to the visit, additional usage of staff PPE, and extensive cleaning of exam room while observing appropriate contact time as indicated for disinfecting solutions.    CC/Reason for Visit: Headaches  HPI: Brooke [name] is a 86 y.o. female who is coming in today for the above mentioned reasons.  She is here today for evaluation of her headaches.  She states that have been present for 3 to 4 days.  She describes it as over her frontal area.  She has been having a lot of postnasal drip and watery eyes.  No fever, no focal neurologic deficits.  Past Medical/Surgical History: Past Medical History:  Diagnosis Date  . Atrial fibrillation (HCC)   . CAD (coronary artery disease)   . Glaucoma   . Hyperlipidemia   . OA (osteoarthritis) of hip   . Tachycardia     Past Surgical History:  Procedure Laterality Date  . ABLATION    . LEFT HEART CATH AND CORONARY ANGIOGRAPHY N/A 08/19/2020   Procedure: LEFT HEART CATH AND CORONARY ANGIOGRAPHY;  Surgeon: Lyn Records, MD;  Location: [facility];  Service: Cardiovascular;  Laterality: N/A;    Social History:  reports that she has never smoked. She has never used smokeless tobacco. She reports that she does not drink alcohol and does not use drugs.  Allergies: Allergies  Allergen Reactions  . Hydrocodone-Acetaminophen Anaphylaxis    Family History:  Family History  Problem Relation Age of Onset  . CVA Mother   . CAD Father   . Aortic stenosis Sister      Current Outpatient Medications:  .  acetaminophen (TYLENOL) 325 MG tablet, Take 650 mg by mouth every 6 (six) hours as needed for mild pain. , Disp: , Rfl:  .  ALPRAZolam (XANAX) 0.5 MG tablet, Take 1 tablet (0.5 mg total) by mouth 2 (two) times daily as needed for anxiety., Disp: 60 tablet, Rfl: 2 .   amiodarone (PACERONE) 200 MG tablet, Take 200 mg by mouth daily., Disp: , Rfl:  .  amLODipine (NORVASC) 5 MG tablet, Take 5 mg by mouth daily. , Disp: , Rfl:  .  apixaban (ELIQUIS) 5 MG TABS tablet, Take 1 tablet (5 mg total) by mouth 2 (two) times daily., Disp: 180 tablet, Rfl: 1 .  aspirin 81 MG EC tablet, Take 81 mg by mouth daily. , Disp: , Rfl:  .  atorvastatin (LIPITOR) 40 MG tablet, Take 40 mg by mouth daily. , Disp: , Rfl:  .  carboxymethylcellul-glycerin (OPTIVE) 0.5-0.9 % ophthalmic solution, Place 1-2 drops into both eyes 3 (three) times daily as needed for dry eyes. Thera Tears, Disp: , Rfl:  .  Cholecalciferol 25 MCG (1000 UT) tablet, Take 1,000 Units by mouth daily. , Disp: , Rfl:  .  dorzolamide-timolol (COSOPT) 22.3-6.8 MG/ML ophthalmic solution, Place 1 drop into the right eye 2 (two) times daily., Disp: , Rfl:  .  hydrocortisone cream 1 %, Apply 1 application topically daily as needed for itching., Disp: , Rfl:  .  latanoprost (XALATAN) 0.005 % ophthalmic solution, INSTILL 1 DROP INTO BOTH  EYES AT BEDTIME (Patient taking differently: INSTILL 1 DROP INTO BOTH  EYES AT BEDTIME), Disp: 5 mL, Rfl: 5 .  losartan (COZAAR) 50 MG tablet, Take 1 tablet (50 mg total) by mouth daily., Disp: 90 tablet, Rfl: 0 .  metoprolol tartrate (LOPRESSOR) 50 MG tablet, Take 1 tablet (50 mg total) by mouth 2 (two) times daily., Disp: 60 tablet, Rfl: 0 .  OXYGEN, Inhale 2 L into the lungs daily as needed (if she feels bad)., Disp: , Rfl:  .  sertraline (ZOLOFT) 100 MG tablet, Take 1 tablet (100 mg total) by mouth daily., Disp: 90 tablet, Rfl: 1  Review of Systems:  Constitutional: Denies fever, chills, diaphoresis, appetite change and fatigue.  HEENT: Denies photophobia, eye pain, redness, hearing loss, ear pain,  mouth sores, trouble swallowing, neck pain, neck stiffness and tinnitus.   Respiratory: Denies SOB, DOE, cough, chest tightness,  and wheezing.   Cardiovascular: Denies chest pain, palpitations  and leg swelling.  Gastrointestinal: Denies nausea, vomiting, abdominal pain, diarrhea, constipation, blood in stool and abdominal distention.  Genitourinary: Denies dysuria, urgency, frequency, hematuria, flank pain and difficulty urinating.  Endocrine: Denies: hot or cold intolerance, sweats, changes in hair or nails, polyuria, polydipsia. Musculoskeletal: Denies myalgias, back pain, joint swelling, arthralgias and gait problem.  Skin: Denies pallor, rash and wound.  Neurological: Denies dizziness, seizures, syncope, weakness, light-headedness, numbness. Hematological: Denies adenopathy. Easy bruising, personal or family bleeding history  Psychiatric/Behavioral: Denies suicidal ideation, mood changes, confusion, nervousness, sleep disturbance and agitation    Physical Exam: Vitals:   10/21/20 1432  BP: 140/70  Pulse: 60  Temp: 98.4 F (36.9 C)  TempSrc: Oral  SpO2: 97%  Weight: 143 lb (64.9 kg)    Body mass index is 22.4 kg/m.   Constitutional: NAD, calm, comfortable Eyes: PERRL, lids and conjunctivae normal, wears corrective lenses ENMT: Mucous membranes are moist.  Pain to palpation of the right-sided maxillary and frontal sinuses. Neck: normal, supple, no masses, no thyromegaly Respiratory: clear to auscultation bilaterally, no wheezing, no crackles. Normal respiratory effort. No accessory muscle use.  Cardiovascular: Regular rate and rhythm, no murmurs / rubs / gallops. No extremity edema.  Neurologic: Grossly intact and nonfocal Psychiatric: Normal judgment and insight. Alert and oriented x 3. Normal mood.    Impression and Plan:  Sinus headache -Based on her other allergy form arm pain to palpation of her sinuses, I suspect her headache is a frontal headache. -Have advised daily antihistamines and twice daily Mucinex/guaifenesin until the end of flu season around June.  She may also use ibuprofen or Tylenol as needed for pain relief. -She knows to follow-up with me  if no improvement.   Patient Instructions  -Nice seeing you today!!  -For your allergies and sinus headache try: Zyrtec 1 tablet daily and Mucinex 1 tablet twice daily until the end of Spring around early June     Mairlyn Tegtmeyer Philip Aspen, MD [facility]

## 2020-10-21 NOTE — Patient Instructions (Signed)
-  Nice seeing you today!!  -For your allergies and sinus headache try: Zyrtec 1 tablet daily and Mucinex 1 tablet twice daily until the end of Spring around early June

## 2020-10-22 ENCOUNTER — Other Ambulatory Visit: Payer: Self-pay | Admitting: Cardiology

## 2020-10-25 ENCOUNTER — Ambulatory Visit (INDEPENDENT_AMBULATORY_CARE_PROVIDER_SITE_OTHER): Payer: Medicare Other | Admitting: Physical Medicine and Rehabilitation

## 2020-10-25 ENCOUNTER — Ambulatory Visit: Payer: Self-pay

## 2020-10-25 ENCOUNTER — Encounter: Payer: Self-pay | Admitting: Physical Medicine and Rehabilitation

## 2020-10-25 ENCOUNTER — Other Ambulatory Visit: Payer: Self-pay

## 2020-10-25 DIAGNOSIS — M25552 Pain in left hip: Secondary | ICD-10-CM | POA: Diagnosis not present

## 2020-10-25 NOTE — Progress Notes (Signed)
   [place] - 86 y.o. female MRN [id number]  Date of birth: 1934-07-22  Office Visit Note: Visit Date: 10/25/2020 PCP: Philip Aspen, Limmie Patricia, MD Referred by: Philip Aspen, Estel*  Subjective: Chief Complaint  Patient presents with  . Left Hip - Pain   HPI:  Corrina Steffensen is a 86 y.o. female who comes in today at the request of Dr. Doneen Poisson for planned Left anesthetic hip arthrogram with fluoroscopic guidance.  The patient has failed conservative care including home exercise, medications, time and activity modification.  This injection will be diagnostic and hopefully therapeutic.  Please see requesting physician notes for further details and justification.   ROS Otherwise per HPI.  Assessment & Plan: Visit Diagnoses:    ICD-10-CM   1. Pain in left hip  M25.552 Large Joint Inj: L hip joint    XR C-ARM NO REPORT    Plan: No additional findings.   Meds & Orders: No orders of the defined types were placed in this encounter.   Orders Placed This Encounter  Procedures  . Large Joint Inj: L hip joint  . XR C-ARM NO REPORT    Follow-up: Return if symptoms worsen or fail to improve, for Doneen Poisson, MD.   Procedures: Large Joint Inj: L hip joint on 10/25/2020 2:00 PM Indications: diagnostic evaluation and pain Details: 22 G 3.5 in needle, fluoroscopy-guided anterior approach  Arthrogram: No  Medications: 4 mL bupivacaine 0.25 %; 60 mg triamcinolone acetonide 40 MG/ML Outcome: tolerated well, no immediate complications  There was excellent flow of contrast producing a partial arthrogram of the hip. The patient did have relief of symptoms during the anesthetic phase of the injection. Procedure, treatment alternatives, risks and benefits explained, specific risks discussed. Consent was given by the patient. Immediately prior to procedure a time out was called to verify the correct patient, procedure, equipment, support staff and site/side marked as  required. Patient was prepped and draped in the usual sterile fashion.          Clinical History: No specialty comments available.     Objective:  VS:  HT:    WT:   BMI:     BP:   HR: bpm  TEMP: ( )  RESP:  Physical Exam   Imaging: No results found.

## 2020-10-25 NOTE — Progress Notes (Signed)
Pt state left hip pain that travels down her left leg. Pt state she takes over the counter pain meds to help ease her pain.  Numeric Pain Rating Scale and Functional Assessment Average Pain 5   In the last MONTH (on 0-10 scale) has pain interfered with the following?  1. General activity like being  able to carry out your everyday physical activities such as walking, climbing stairs, carrying groceries, or moving a chair?  Rating(8)

## 2020-11-01 MED ORDER — TRIAMCINOLONE ACETONIDE 40 MG/ML IJ SUSP
60.0000 mg | INTRAMUSCULAR | Status: AC | PRN
  Administered 2020-10-25: 60 mg via INTRA_ARTICULAR

## 2020-11-01 MED ORDER — BUPIVACAINE HCL 0.25 % IJ SOLN
4.0000 mL | INTRAMUSCULAR | Status: AC | PRN
  Administered 2020-10-25: 4 mL via INTRA_ARTICULAR

## 2020-11-15 ENCOUNTER — Telehealth: Payer: Self-pay | Admitting: Physical Medicine and Rehabilitation

## 2020-11-15 NOTE — Telephone Encounter (Signed)
Pt had an left hip inj on 10/25/20 and stated it helped but last two weeks. Please Advise.

## 2020-11-15 NOTE — Telephone Encounter (Signed)
She should f/up Dr. Magnus Ivan for eval/mgt

## 2020-11-15 NOTE — Telephone Encounter (Signed)
Called and spoke with pt

## 2020-11-15 NOTE — Telephone Encounter (Signed)
Patient called requesting a call from Dr. Alvester Morin or Malva Cogan. Patient states she had an injection and it lasted for 2 weeks. She need medical advice. Patient phone number is [phone].

## 2020-12-07 ENCOUNTER — Telehealth: Payer: Self-pay | Admitting: Physical Medicine and Rehabilitation

## 2020-12-07 NOTE — Telephone Encounter (Signed)
Patient called needing to schedule an appointment with Dr Alvester Morin for her hip. The number to contact patient is [phone]

## 2020-12-07 NOTE — Telephone Encounter (Signed)
Called patient and left message advising per 5/9 telephone encounter that she schedule an appointment with Dr. Magnus Ivan.

## 2020-12-09 ENCOUNTER — Other Ambulatory Visit: Payer: Self-pay

## 2020-12-09 MED ORDER — AMLODIPINE BESYLATE 5 MG PO TABS: 5.0000 mg | ORAL_TABLET | Freq: Every day | ORAL | 3 refills | Status: AC

## 2020-12-09 MED ORDER — METOPROLOL TARTRATE 50 MG PO TABS: 50.0000 mg | ORAL_TABLET | Freq: Two times a day (BID) | ORAL | 3 refills | Status: AC

## 2020-12-15 ENCOUNTER — Encounter: Payer: Self-pay | Admitting: Orthopaedic Surgery

## 2020-12-15 ENCOUNTER — Ambulatory Visit (INDEPENDENT_AMBULATORY_CARE_PROVIDER_SITE_OTHER): Payer: Medicare Other | Admitting: Orthopaedic Surgery

## 2020-12-15 ENCOUNTER — Other Ambulatory Visit: Payer: Self-pay

## 2020-12-15 DIAGNOSIS — M1612 Unilateral primary osteoarthritis, left hip: Secondary | ICD-10-CM

## 2020-12-15 DIAGNOSIS — I25119 Atherosclerotic heart disease of native coronary artery with unspecified angina pectoris: Secondary | ICD-10-CM

## 2020-12-15 NOTE — Progress Notes (Signed)
The patient is following up today after having an intra-articular steroid injection in the left hip joint by Dr. Alvester Morin under fluoroscopy.  This did not help much at all.  Her x-rays show severe end-stage arthritis of her left hip but she also has retained dynamic hip screw and sideplate.  She has severe arthritis in that left hip and is definitely affecting her mobility, quality of life and actives daily living.  She is 86 years old and has cardiac issues and is on Eliquis.  She ambulates with a walker.  She is not sure what else that she would like to do but she would like to avoid surgery.  She understands surgery will be a large undertaking given the fact that there is hardware has been in for over a decade and would likely need to be a staged procedure.  It would be difficult to get all hardware out and then turn around and do a hip replacement in the same setting.  I talked her about this in length.  Her hip pain is severe but there is not really anything else that we can recommend since the injection did not work other than continued activity modification with walking with a walker and using some type of pedaling device in a chair which would help with her heart rate.  She should avoid walking up and down hills and going for long distances.  If he gets the point where she wants to consider surgery and she is cleared clinically and medically, we would first set her up for removing the hardware prior to hip replacement.

## 2020-12-21 ENCOUNTER — Other Ambulatory Visit: Payer: Self-pay

## 2020-12-22 ENCOUNTER — Ambulatory Visit (INDEPENDENT_AMBULATORY_CARE_PROVIDER_SITE_OTHER): Payer: Medicare Other | Admitting: Internal Medicine

## 2020-12-22 ENCOUNTER — Encounter: Payer: Self-pay | Admitting: Internal Medicine

## 2020-12-22 VITALS — BP 130/70 | HR 64 | Temp 98.1°F | Ht 67.0 in | Wt 140.0 lb

## 2020-12-22 DIAGNOSIS — I25119 Atherosclerotic heart disease of native coronary artery with unspecified angina pectoris: Secondary | ICD-10-CM

## 2020-12-22 DIAGNOSIS — F411 Generalized anxiety disorder: Secondary | ICD-10-CM | POA: Diagnosis not present

## 2020-12-22 NOTE — Progress Notes (Signed)
Established Patient Office Visit     This visit occurred during the SARS-CoV-2 public health emergency.  Safety protocols were in place, including screening questions prior to the visit, additional usage of staff PPE, and extensive cleaning of exam room while observing appropriate contact time as indicated for disinfecting solutions.    CC/Reason for Visit: D discuss her anxiety  HPI: Brooke [name] is a 86 y.o. female who is coming in today for the above mentioned reasons.  She has a history of generalized anxiety disorder.  At last visit we had increased her Zoloft from 50 to 100 mg.  She is also on Xanax 0.5 mg that she takes twice daily sometimes even 3 times a day.  She has progressive fatigue as the day advances.  She has seen cardiology for this and work-up has been complete.  She complains of significant, distressing anxiety throughout the day.  Past Medical/Surgical History: Past Medical History:  Diagnosis Date   Atrial fibrillation (HCC)    CAD (coronary artery disease)    Glaucoma    Hyperlipidemia    OA (osteoarthritis) of hip    Tachycardia     Past Surgical History:  Procedure Laterality Date   ABLATION     LEFT HEART CATH AND CORONARY ANGIOGRAPHY N/A 08/19/2020   Procedure: LEFT HEART CATH AND CORONARY ANGIOGRAPHY;  Surgeon: Lyn Records, MD;  Location: [facility];  Service: Cardiovascular;  Laterality: N/A;    Social History:  reports that she has never smoked. She has never used smokeless tobacco. She reports that she does not drink alcohol and does not use drugs.  Allergies: Allergies  Allergen Reactions   Hydrocodone-Acetaminophen Anaphylaxis    Family History:  Family History  Problem Relation Age of Onset   CVA Mother    CAD Father    Aortic stenosis Sister      Current Outpatient Medications:    acetaminophen (TYLENOL) 325 MG tablet, Take 650 mg by mouth every 6 (six) hours as needed for mild pain. , Disp: , Rfl:    ALPRAZolam  (XANAX) 0.5 MG tablet, Take 1 tablet (0.5 mg total) by mouth 2 (two) times daily as needed for anxiety., Disp: 60 tablet, Rfl: 2   amiodarone (PACERONE) 200 MG tablet, Take 200 mg by mouth daily., Disp: , Rfl:    amLODipine (NORVASC) 5 MG tablet, Take 1 tablet (5 mg total) by mouth daily., Disp: 90 tablet, Rfl: 3   apixaban (ELIQUIS) 5 MG TABS tablet, Take 1 tablet (5 mg total) by mouth 2 (two) times daily., Disp: 180 tablet, Rfl: 1   aspirin 81 MG EC tablet, Take 81 mg by mouth daily. , Disp: , Rfl:    atorvastatin (LIPITOR) 40 MG tablet, Take 40 mg by mouth daily. , Disp: , Rfl:    carboxymethylcellul-glycerin (OPTIVE) 0.5-0.9 % ophthalmic solution, Place 1-2 drops into both eyes 3 (three) times daily as needed for dry eyes. Thera Tears, Disp: , Rfl:    Cholecalciferol 25 MCG (1000 UT) tablet, Take 1,000 Units by mouth daily. , Disp: , Rfl:    dorzolamide-timolol (COSOPT) 22.3-6.8 MG/ML ophthalmic solution, Place 1 drop into the right eye 2 (two) times daily., Disp: , Rfl:    hydrocortisone cream 1 %, Apply 1 application topically daily as needed for itching., Disp: , Rfl:    latanoprost (XALATAN) 0.005 % ophthalmic solution, INSTILL 1 DROP INTO BOTH  EYES AT BEDTIME (Patient taking differently: INSTILL 1 DROP INTO BOTH  EYES  AT BEDTIME), Disp: 5 mL, Rfl: 5   losartan (COZAAR) 50 MG tablet, Take 1 tablet (50 mg total) by mouth daily., Disp: 90 tablet, Rfl: 3   metoprolol tartrate (LOPRESSOR) 50 MG tablet, Take 1 tablet (50 mg total) by mouth 2 (two) times daily., Disp: 180 tablet, Rfl: 3   OXYGEN, Inhale 2 L into the lungs daily as needed (if she feels bad)., Disp: , Rfl:    sertraline (ZOLOFT) 100 MG tablet, Take 1 tablet (100 mg total) by mouth daily. (Patient not taking: Reported on 12/22/2020), Disp: 90 tablet, Rfl: 1  Review of Systems:  Constitutional: Denies fever, chills, diaphoresis, appetite change. HEENT: Denies photophobia, eye pain, redness, hearing loss, ear pain, congestion, sore  throat, rhinorrhea, sneezing, mouth sores, trouble swallowing, neck pain, neck stiffness and tinnitus.   Respiratory: Denies SOB, DOE, cough, chest tightness,  and wheezing.   Cardiovascular: Denies chest pain, palpitations and leg swelling.  Gastrointestinal: Denies nausea, vomiting, abdominal pain, diarrhea, constipation, blood in stool and abdominal distention.  Genitourinary: Denies dysuria, urgency, frequency, hematuria, flank pain and difficulty urinating.  Endocrine: Denies: hot or cold intolerance, sweats, changes in hair or nails, polyuria, polydipsia. Musculoskeletal: Denies myalgias, back pain, joint swelling, arthralgias and gait problem.  Skin: Denies pallor, rash and wound.  Neurological: Denies dizziness, seizures, syncope, weakness, light-headedness, numbness and headaches.  Hematological: Denies adenopathy. Easy bruising, personal or family bleeding history  Psychiatric/Behavioral: Denies suicidal ideation,  confusion.    Physical Exam: Vitals:   12/22/20 1024  BP: 130/70  Pulse: 64  Temp: 98.1 F (36.7 C)  TempSrc: Oral  SpO2: 97%  Weight: 140 lb (63.5 kg)  Height: 5\' 7"  (1.702 m)    Body mass index is 21.93 kg/m.   Constitutional: NAD, calm, comfortable Eyes: PERRL, lids and conjunctivae normal ENMT: Mucous membranes are moist.  Respiratory: clear to auscultation bilaterally, no wheezing, no crackles. Normal respiratory effort. No accessory muscle use.  Cardiovascular: Regular rate and rhythm, no murmurs / rubs / gallops. No extremity edema.  Neurologic: Grossly intact and nonfocal Psychiatric: Normal judgment and insight. Alert and oriented x 3.  Mood appears anxious and flat   Impression and Plan:  GAD (generalized anxiety disorder)  -She continues to complain of the same symptoms despite medication adjustment, I had also referred her to cardiology due to progressive fatigue which I thought could be a CAD equivalent and their work-up is now  complete. -Given lack of answers, I think it is time to involve mental health professionals.  I will refer her to psychiatry.  She has been given information on how to schedule CBT sessions.    Patient Instructions  -Nice seeing you today!!  -Will arrange for you to see a psychiatrist.  -Make sure you schedule follow up with a counselor with the information I have provided today.   , MD [facility]

## 2020-12-22 NOTE — Patient Instructions (Signed)
-  Nice seeing you today!!  -Will arrange for you to see a psychiatrist.  -Make sure you schedule follow up with a counselor with the information I have provided today.

## 2020-12-27 ENCOUNTER — Telehealth: Payer: Self-pay | Admitting: Internal Medicine

## 2020-12-27 NOTE — Telephone Encounter (Signed)
Patient called stating that during her last visit with Dr. Ardyth Harps, she was given two phone numbers for psychiatrists. She said that she called both numbers. One location stated that they weren't taking new patients and she said that she never heard anything else back from the other number.  Patient would like to know if there are any other options.  Patient's number is [phone]

## 2020-12-31 NOTE — Telephone Encounter (Signed)
Spoke with patient and copies will be mailed to home address with more Psychiatric care resources.

## 2021-01-05 ENCOUNTER — Telehealth: Payer: Self-pay | Admitting: Cardiology

## 2021-01-05 NOTE — Telephone Encounter (Signed)
    Pt asking help from Dr. Cristal Deer to find a mental health doctor

## 2021-01-05 NOTE — Telephone Encounter (Signed)
Returned call to patient who states she has been given a list of mental health providers by her PCP and has reached out to a couple but has not found anyone that can see her. She states she specifically needs a psychiatrist per Dr. Ardyth Harps. She could not provide the name of the provider she has already reached out to so I advised that I will forward the message to Dr. Cristal Deer to ask if she has any specific providers she recommends. Patient verbalized understanding and is grateful for our help.

## 2021-01-06 ENCOUNTER — Other Ambulatory Visit: Payer: Self-pay | Admitting: Internal Medicine

## 2021-01-06 DIAGNOSIS — F411 Generalized anxiety disorder: Secondary | ICD-10-CM

## 2021-01-14 NOTE — Telephone Encounter (Signed)
I don't personally know any local mental health providers, but I would reach back out to Dr. Ardyth Harps if she has not yet found someone who can see her.

## 2021-01-18 ENCOUNTER — Ambulatory Visit (INDEPENDENT_AMBULATORY_CARE_PROVIDER_SITE_OTHER): Payer: Medicare Other | Admitting: Family Medicine

## 2021-01-18 ENCOUNTER — Other Ambulatory Visit: Payer: Self-pay

## 2021-01-18 ENCOUNTER — Encounter: Payer: Self-pay | Admitting: Family Medicine

## 2021-01-18 VITALS — BP 128/74 | HR 66 | Temp 98.5°F | Wt 143.0 lb

## 2021-01-18 DIAGNOSIS — I25119 Atherosclerotic heart disease of native coronary artery with unspecified angina pectoris: Secondary | ICD-10-CM

## 2021-01-18 DIAGNOSIS — M940 Chondrocostal junction syndrome [Tietze]: Secondary | ICD-10-CM

## 2021-01-18 MED ORDER — METHYLPREDNISOLONE 4 MG PO TBPK
ORAL_TABLET | ORAL | 0 refills | Status: DC
Start: 2021-01-18 — End: 2021-03-23

## 2021-01-18 NOTE — Progress Notes (Signed)
   Subjective:    Patient ID: Brooke [name], female    DOB: Oct 12, 1934, 86 y.o.   MRN: [id number]  HPI Here for 3 weeks of a mild pain in the left chest area that comes and goes. It does not hurt to take a deep breath, but it does hurt when she leans over her kitch sink to wash her hair. No SOB or coughing. No recent trauma. The pains are sharp and very brief. Tylenol does not help.    Review of Systems  Constitutional: Negative.   Respiratory: Negative.    Cardiovascular:  Positive for chest pain. Negative for palpitations and leg swelling.      Objective:   Physical Exam Constitutional:      Appearance: Normal appearance.     Comments: She walks with a walker   Cardiovascular:     Rate and Rhythm: Normal rate and regular rhythm.     Pulses: Normal pulses.     Heart sounds: Normal heart sounds.  Pulmonary:     Effort: Pulmonary effort is normal.     Breath sounds: Normal breath sounds.     Comments: She is quite tender over the left chest area, especially along the left sternal margin  Neurological:     Mental Status: She is alert.          Assessment & Plan:  Costochondritis, treat with a Medrol dose pack. Recheck as needed.  Gershon Crane, MD

## 2021-01-18 NOTE — Telephone Encounter (Signed)
Pt updated with MD's recommendations and verbalized understanding.  

## 2021-02-14 ENCOUNTER — Telehealth: Payer: Self-pay | Admitting: Cardiology

## 2021-02-14 NOTE — Telephone Encounter (Signed)
Called patient back about her medications. Patient was concerned that all her medications might be making her feel down and sad. Patient stated she has been on these medications for a long while. Informed patient that her medication should not be causing her to feel this way, but we will double check with our Pharm D.  Encouraged patient to keep her appointment with psychologist.

## 2021-02-14 NOTE — Telephone Encounter (Signed)
Pt c/o medication issue:  1. Name of Medication:  losartan (COZAAR) 50 MG tablet metoprolol tartrate (LOPRESSOR) 50 MG tablet apixaban (ELIQUIS) 5 MG TABS tablet amLODipine (NORVASC) 5 MG tablet ALPRAZolam (XANAX) 0.5 MG tablet amiodarone (PACERONE) 200 MG tablet atorvastatin (LIPITOR) 40 MG tablet 2 different eye drops   2. How are you currently taking this medication (dosage and times per day)?   3. Are you having a reaction (difficulty breathing--STAT)? no  4. What is your medication issue? Patient states she feels really tired and weak. She states he just feels bad, down and sad. She says she has an appointment with a psychologist next week. She states she is not sure if it is one of her medications that could be making her feel this way.

## 2021-02-14 NOTE — Telephone Encounter (Signed)
There is a small chance it could be from her metoprolol, but this medication is needed for her afib. She should continue this and see psychologist.

## 2021-03-22 ENCOUNTER — Other Ambulatory Visit: Payer: Self-pay

## 2021-03-23 ENCOUNTER — Encounter: Payer: Self-pay | Admitting: Internal Medicine

## 2021-03-23 ENCOUNTER — Ambulatory Visit (INDEPENDENT_AMBULATORY_CARE_PROVIDER_SITE_OTHER): Payer: Medicare Other | Admitting: Internal Medicine

## 2021-03-23 VITALS — BP 130/70 | HR 70 | Temp 97.5°F | Wt 148.9 lb

## 2021-03-23 DIAGNOSIS — I872 Venous insufficiency (chronic) (peripheral): Secondary | ICD-10-CM | POA: Diagnosis not present

## 2021-03-23 DIAGNOSIS — Z23 Encounter for immunization: Secondary | ICD-10-CM

## 2021-03-23 DIAGNOSIS — I25119 Atherosclerotic heart disease of native coronary artery with unspecified angina pectoris: Secondary | ICD-10-CM | POA: Diagnosis not present

## 2021-03-23 DIAGNOSIS — M79604 Pain in right leg: Secondary | ICD-10-CM | POA: Diagnosis not present

## 2021-03-23 DIAGNOSIS — M79605 Pain in left leg: Secondary | ICD-10-CM

## 2021-03-23 NOTE — Progress Notes (Signed)
Acute office Visit     This visit occurred during the SARS-CoV-2 public health emergency.  Safety protocols were in place, including screening questions prior to the visit, additional usage of staff PPE, and extensive cleaning of exam room while observing appropriate contact time as indicated for disinfecting solutions.    CC/Reason for Visit: Discuss leg pain  HPI: Brooke [name] is a 86 y.o. female who is coming in today for the above mentioned reasons.  She is here to discuss leg pain and what she feels is leg swelling.  She has a history of chronic venous insufficiency with significant varicosities especially on the right side.  She has been wearing knee-high stockings that do not fully alleviate her pain.  She has started seeing a therapist for her severe depression and anxiety.  She feels like she is doing well with this.  Past Medical/Surgical History: Past Medical History:  Diagnosis Date   Atrial fibrillation (HCC)    CAD (coronary artery disease)    Glaucoma    Hyperlipidemia    OA (osteoarthritis) of hip    Tachycardia     Past Surgical History:  Procedure Laterality Date   ABLATION     LEFT HEART CATH AND CORONARY ANGIOGRAPHY N/A 08/19/2020   Procedure: LEFT HEART CATH AND CORONARY ANGIOGRAPHY;  Surgeon: Lyn Records, MD;  Location: [facility];  Service: Cardiovascular;  Laterality: N/A;    Social History:  reports that she has never smoked. She has never used smokeless tobacco. She reports that she does not drink alcohol and does not use drugs.  Allergies: Allergies  Allergen Reactions   Hydrocodone-Acetaminophen Anaphylaxis    Family History:  Family History  Problem Relation Age of Onset   CVA Mother    CAD Father    Aortic stenosis Sister      Current Outpatient Medications:    acetaminophen (TYLENOL) 325 MG tablet, Take 650 mg by mouth every 6 (six) hours as needed for mild pain. , Disp: , Rfl:    ALPRAZolam (XANAX) 0.5 MG tablet, TAKE  1 TABLET BY MOUTH  TWICE DAILY AS NEEDED FOR  ANXIETY, Disp: 60 tablet, Rfl: 1   amiodarone (PACERONE) 200 MG tablet, Take 200 mg by mouth daily., Disp: , Rfl:    amLODipine (NORVASC) 5 MG tablet, Take 1 tablet (5 mg total) by mouth daily., Disp: 90 tablet, Rfl: 3   apixaban (ELIQUIS) 5 MG TABS tablet, Take 1 tablet (5 mg total) by mouth 2 (two) times daily., Disp: 180 tablet, Rfl: 1   aspirin 81 MG EC tablet, Take 81 mg by mouth daily. , Disp: , Rfl:    atorvastatin (LIPITOR) 40 MG tablet, Take 40 mg by mouth daily. , Disp: , Rfl:    carboxymethylcellul-glycerin (OPTIVE) 0.5-0.9 % ophthalmic solution, Place 1-2 drops into both eyes 3 (three) times daily as needed for dry eyes. Thera Tears, Disp: , Rfl:    Cholecalciferol 25 MCG (1000 UT) tablet, Take 1,000 Units by mouth daily. , Disp: , Rfl:    dorzolamide-timolol (COSOPT) 22.3-6.8 MG/ML ophthalmic solution, Place 1 drop into the right eye 2 (two) times daily., Disp: , Rfl:    hydrocortisone cream 1 %, Apply 1 application topically daily as needed for itching., Disp: , Rfl:    latanoprost (XALATAN) 0.005 % ophthalmic solution, INSTILL 1 DROP INTO BOTH  EYES AT BEDTIME (Patient taking differently: INSTILL 1 DROP INTO BOTH  EYES AT BEDTIME), Disp: 5 mL, Rfl: 5  losartan (COZAAR) 50 MG tablet, Take 1 tablet (50 mg total) by mouth daily., Disp: 90 tablet, Rfl: 3   metoprolol tartrate (LOPRESSOR) 50 MG tablet, Take 1 tablet (50 mg total) by mouth 2 (two) times daily., Disp: 180 tablet, Rfl: 3   OXYGEN, Inhale 2 L into the lungs daily as needed (if she feels bad)., Disp: , Rfl:    sertraline (ZOLOFT) 100 MG tablet, Take 1 tablet (100 mg total) by mouth daily., Disp: 90 tablet, Rfl: 1  Review of Systems:  Constitutional: Denies fever, chills, diaphoresis, appetite change and fatigue.  HEENT: Denies photophobia, eye pain, redness, hearing loss, ear pain, congestion, sore throat, rhinorrhea, sneezing, mouth sores, trouble swallowing, neck pain, neck  stiffness and tinnitus.   Respiratory: Denies SOB, DOE, cough, chest tightness,  and wheezing.   Cardiovascular: Denies chest pain, palpitations and leg swelling.  Gastrointestinal: Denies nausea, vomiting, abdominal pain, diarrhea, constipation, blood in stool and abdominal distention.  Genitourinary: Denies dysuria, urgency, frequency, hematuria, flank pain and difficulty urinating.  Endocrine: Denies: hot or cold intolerance, sweats, changes in hair or nails, polyuria, polydipsia. Musculoskeletal: Denies , back pain, joint swelling, arthralgias and gait problem.  Skin: Denies pallor, rash and wound.  Neurological: Denies dizziness, seizures, syncope, weakness, light-headedness, numbness and headaches.  Hematological: Denies adenopathy. Easy bruising, personal or family bleeding history  Psychiatric/Behavioral: Denies suicidal ideation, mood changes, confusion, nervousness, sleep disturbance and agitation    Physical Exam: Vitals:   03/23/21 1348  BP: 130/70  Pulse: 70  Temp: (!) 97.5 F (36.4 C)  TempSrc: Oral  SpO2: 97%  Weight: 148 lb 14.4 oz (67.5 kg)    Body mass index is 23.32 kg/m.   Constitutional: NAD, calm, comfortable Eyes: PERRL, lids and conjunctivae normal ENMT: Mucous membranes are moist.  Respiratory: clear to auscultation bilaterally, no wheezing, no crackles. Normal respiratory effort. No accessory muscle use.  Cardiovascular: Regular rate and rhythm, no murmurs / rubs / gallops.  Trace bilateral lower extremity edema. 2+ pedal pulses.    Impression and Plan:  Chronic venous insufficiency  Pain in both lower extremities  - Plan: Ambulatory referral to Vascular Surgery -2D echo from September 2021 reviewed normal ejection fraction of 60 to 65%, however she did have grade 3 diastolic dysfunction.  Given lack of significant edema or dyspnea on exertion above her baseline, I do not believe this represents a CHF exacerbation. -Advised thigh-high compression  stockings instead of knee-high given varicosities extend up into the thigh area.  Need for influenza vaccination -Flu vaccine administered today.  Time spent: 28 minutes reviewing chart, interviewing and examining patient and formulating plan of care.    Chaya Jan, MD [facility]

## 2021-03-23 NOTE — Addendum Note (Signed)
Addended by: Kern Reap B on: 03/23/2021 04:28 PM   Modules accepted: Orders

## 2021-03-30 ENCOUNTER — Other Ambulatory Visit: Payer: Self-pay | Admitting: *Deleted

## 2021-03-30 DIAGNOSIS — M79606 Pain in leg, unspecified: Secondary | ICD-10-CM

## 2021-03-31 ENCOUNTER — Ambulatory Visit (HOSPITAL_COMMUNITY)
Admission: RE | Admit: 2021-03-31 | Discharge: 2021-03-31 | Disposition: A | Discharge status: Home / Self Care | Payer: Medicare Other | Source: Ambulatory Visit | Attending: Vascular Surgery | Admitting: Vascular Surgery

## 2021-03-31 ENCOUNTER — Other Ambulatory Visit: Payer: Self-pay

## 2021-03-31 DIAGNOSIS — M79604 Pain in right leg: Secondary | ICD-10-CM

## 2021-03-31 DIAGNOSIS — M79606 Pain in leg, unspecified: Secondary | ICD-10-CM | POA: Diagnosis present

## 2021-04-07 ENCOUNTER — Ambulatory Visit (INDEPENDENT_AMBULATORY_CARE_PROVIDER_SITE_OTHER): Payer: Medicare Other | Admitting: Physician Assistant

## 2021-04-07 ENCOUNTER — Encounter: Payer: Self-pay | Admitting: Physician Assistant

## 2021-04-07 ENCOUNTER — Other Ambulatory Visit: Payer: Self-pay

## 2021-04-07 DIAGNOSIS — M2170 Unequal limb length (acquired), unspecified site: Secondary | ICD-10-CM

## 2021-04-07 DIAGNOSIS — I25119 Atherosclerotic heart disease of native coronary artery with unspecified angina pectoris: Secondary | ICD-10-CM | POA: Diagnosis not present

## 2021-04-07 DIAGNOSIS — M1612 Unilateral primary osteoarthritis, left hip: Secondary | ICD-10-CM

## 2021-04-07 NOTE — Progress Notes (Signed)
HPI: Ms. Rollison returns today due to left hip pain.  She has known end-stage arthritis left hip.  She underwent intra-articular injection of the left hip 10/25/2020.  She is on chronic Eliquis and has cardiac issues.  She states that the cortisone injection gave her about 40% relief for about 2 days now her pain is back.  She is also having some low back pain.  She does have a history of back surgery in the past.  Physical exam General well-developed well-nourished pleasant female no acute distress. Right hip excellent range of motion without pain.  Left hip limited internal rotation with pain.  She ambulates with a rolling walker.  She is slow to get on and off the exam table but is able to do this with minimal assistance.  Leg length discrepancy with left leg slightly shorter than the right.  Calves are supple and nontender bilaterally.  Impression: Left hip osteoarthritis Leg length discrepancy  Plan: Like for her to obtain an insert for left shoe to help with the overall leg length discrepancy.  In regards to her hip she understands she is at increased risk to undergo removal of the hardware from the hip and then undergo a second surgery to undergo a left total hip arthroplasty.  This point time she does not feel that it is worth the risk.  She we will follow-up with [place] on an as-needed basis.  Questions were encouraged and answered at length.

## 2021-04-13 ENCOUNTER — Telehealth: Payer: Self-pay | Admitting: Physician Assistant

## 2021-04-13 NOTE — Telephone Encounter (Signed)
Pt called requesting a call back for medical advice. Pt states she doesn't want to take prescribed pain meds and would like to know the best over the counter pain medication to take. Pt phone number [phone].

## 2021-04-15 ENCOUNTER — Telehealth: Payer: Self-pay

## 2021-04-15 NOTE — Telephone Encounter (Signed)
Pt called and stated her LBP has gotten worse. She has not had any falls or injuries. She is taking tylenol and using heat but it isnt helping. She is unable to walk due to pain. Pt takes Eliquis. Please advise

## 2021-04-17 ENCOUNTER — Emergency Department (HOSPITAL_BASED_OUTPATIENT_CLINIC_OR_DEPARTMENT_OTHER)
Admission: EM | Admit: 2021-04-17 | Discharge: 2021-04-17 | Disposition: A | Discharge status: Home / Self Care | Payer: Medicare Other | Attending: Emergency Medicine | Admitting: Emergency Medicine

## 2021-04-17 ENCOUNTER — Encounter (HOSPITAL_BASED_OUTPATIENT_CLINIC_OR_DEPARTMENT_OTHER): Payer: Self-pay | Admitting: Emergency Medicine

## 2021-04-17 ENCOUNTER — Emergency Department (HOSPITAL_BASED_OUTPATIENT_CLINIC_OR_DEPARTMENT_OTHER): Payer: Medicare Other

## 2021-04-17 ENCOUNTER — Emergency Department (HOSPITAL_BASED_OUTPATIENT_CLINIC_OR_DEPARTMENT_OTHER): Payer: Medicare Other | Admitting: Radiology

## 2021-04-17 DIAGNOSIS — X58XXXA Exposure to other specified factors, initial encounter: Secondary | ICD-10-CM | POA: Diagnosis not present

## 2021-04-17 DIAGNOSIS — Z7901 Long term (current) use of anticoagulants: Secondary | ICD-10-CM | POA: Insufficient documentation

## 2021-04-17 DIAGNOSIS — I2510 Atherosclerotic heart disease of native coronary artery without angina pectoris: Secondary | ICD-10-CM | POA: Insufficient documentation

## 2021-04-17 DIAGNOSIS — I4891 Unspecified atrial fibrillation: Secondary | ICD-10-CM | POA: Diagnosis not present

## 2021-04-17 DIAGNOSIS — I10 Essential (primary) hypertension: Secondary | ICD-10-CM | POA: Diagnosis not present

## 2021-04-17 DIAGNOSIS — Z79899 Other long term (current) drug therapy: Secondary | ICD-10-CM | POA: Insufficient documentation

## 2021-04-17 DIAGNOSIS — S39012A Strain of muscle, fascia and tendon of lower back, initial encounter: Secondary | ICD-10-CM | POA: Diagnosis not present

## 2021-04-17 DIAGNOSIS — Z7982 Long term (current) use of aspirin: Secondary | ICD-10-CM | POA: Diagnosis not present

## 2021-04-17 DIAGNOSIS — S3992XA Unspecified injury of lower back, initial encounter: Secondary | ICD-10-CM | POA: Diagnosis present

## 2021-04-17 LAB — URINALYSIS, ROUTINE W REFLEX MICROSCOPIC
Bilirubin Urine: NEGATIVE
Glucose, UA: NEGATIVE mg/dL
Hgb urine dipstick: NEGATIVE
Ketones, ur: NEGATIVE mg/dL
Nitrite: NEGATIVE
Protein, ur: NEGATIVE mg/dL
Specific Gravity, Urine: <1.005 — ABNORMAL LOW (ref 1.005–1.030)
pH: 7 (ref 5.0–8.0)

## 2021-04-17 IMAGING — CT CT RENAL STONE PROTOCOL
2 of 4 series · 15 of 46 positions shown, 17 images · non-contrast
Comparison: None.

CLINICAL DATA: Flank pain, kidney stone suspected. Bilateral lower
back pain since [REDACTED].

EXAM:
CT ABDOMEN AND PELVIS WITHOUT CONTRAST
TECHNIQUE: Multidetector CT imaging of the abdomen and pelvis was performed
following the standard protocol without IV contrast.

[Series 2: stone full (person_name) · axial · 0.69mm/px · z∈[+98,+503]mm · 12 of 93 slices shown, 14 images]
[im 8/93  soft-tissue]
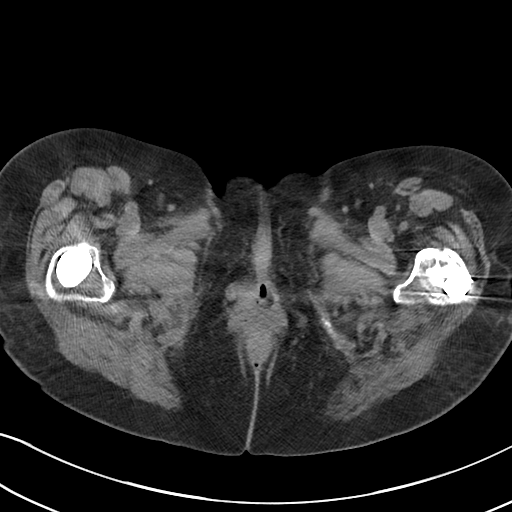
[im 8/93  bone]
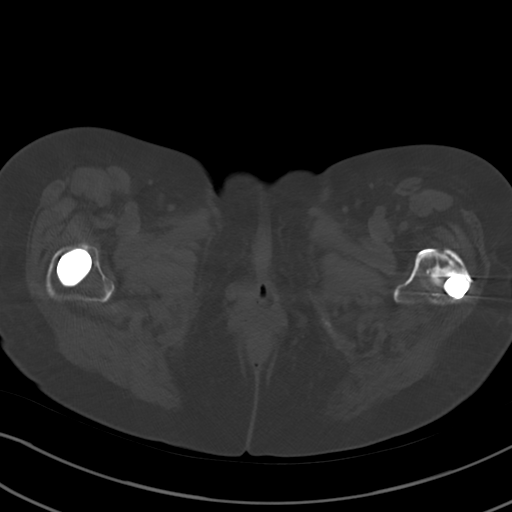
[im 15/93  soft-tissue]
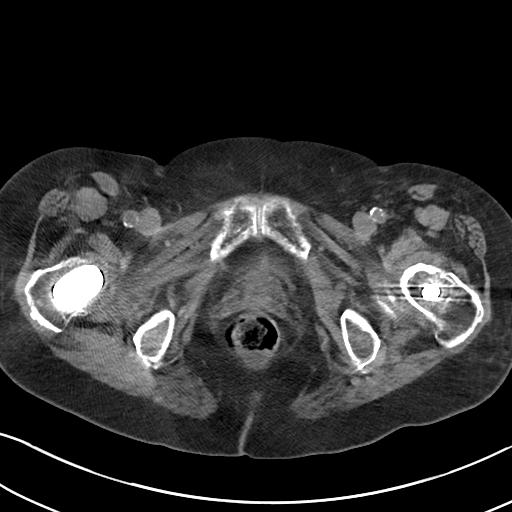
[im 23/93  soft-tissue]
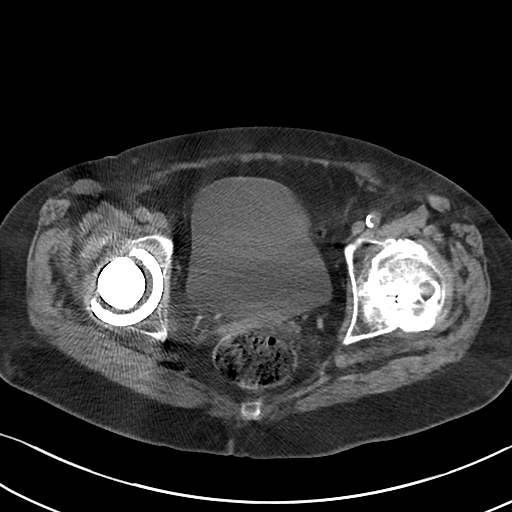
[im 30/93  soft-tissue]
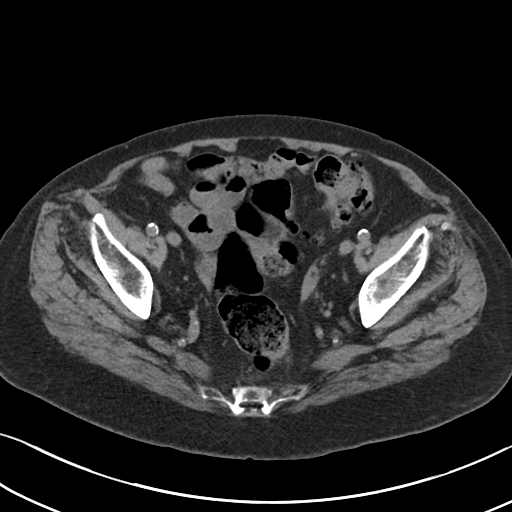
[im 37/93  soft-tissue]
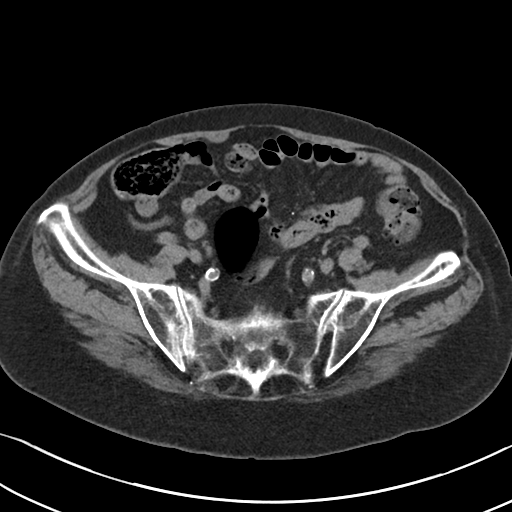
[im 45/93  soft-tissue]
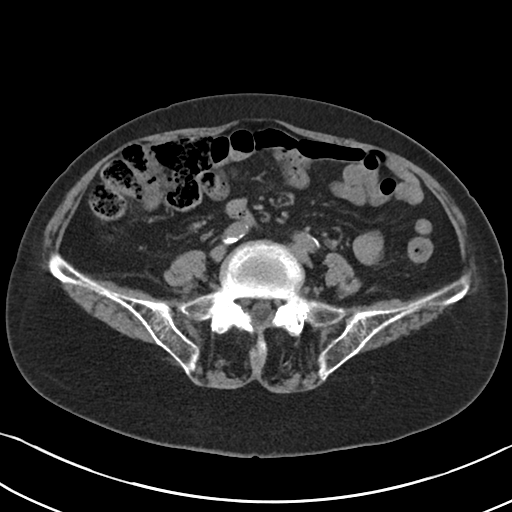
[im 52/93  soft-tissue]
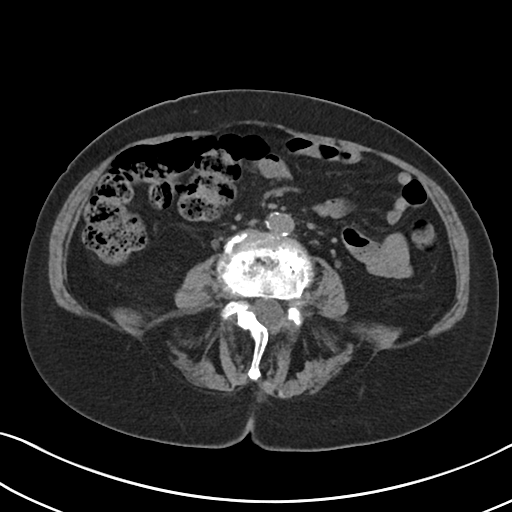
[im 59/93  soft-tissue]
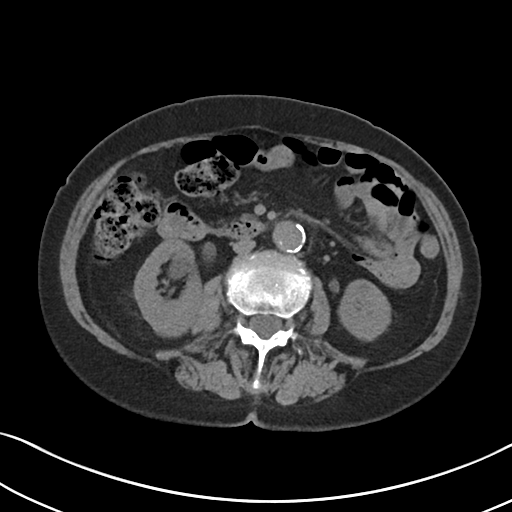
[im 67/93  soft-tissue]
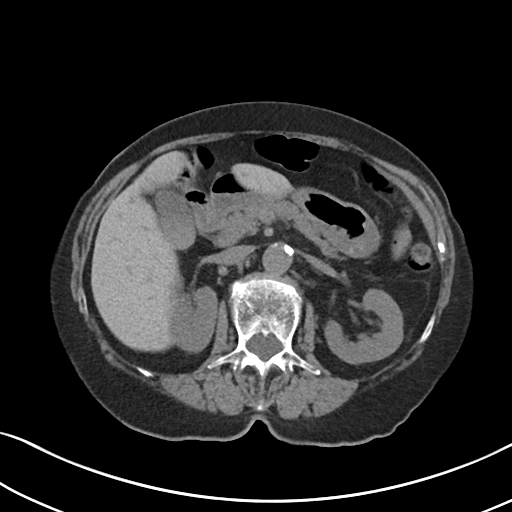
[im 67/93  bone]
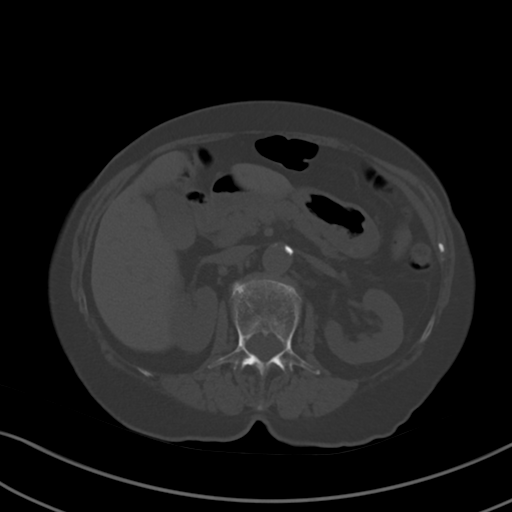
[im 74/93  soft-tissue]
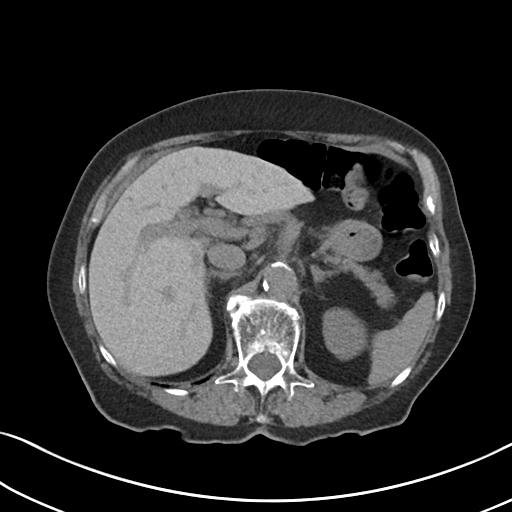
[im 81/93  soft-tissue]
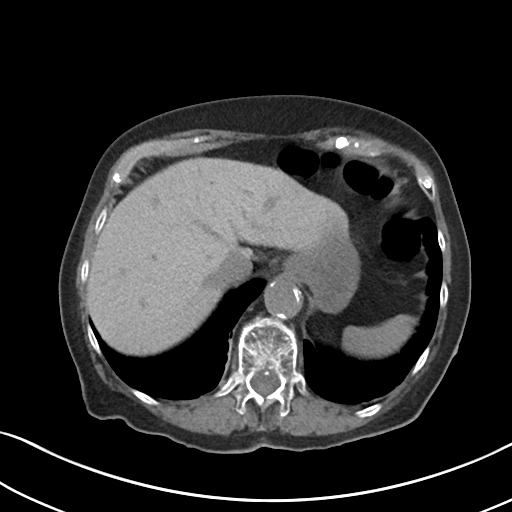
[im 89/93  soft-tissue]
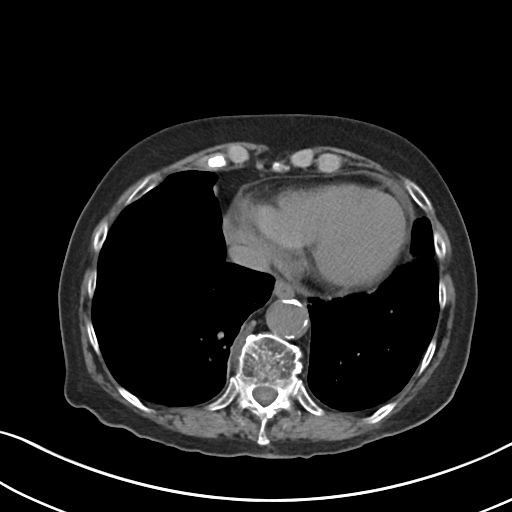

[Series 5: coronal (person_name) · coronal · 0.68mm/px · 3 of 86 slices shown]
[im 29/86  soft-tissue]
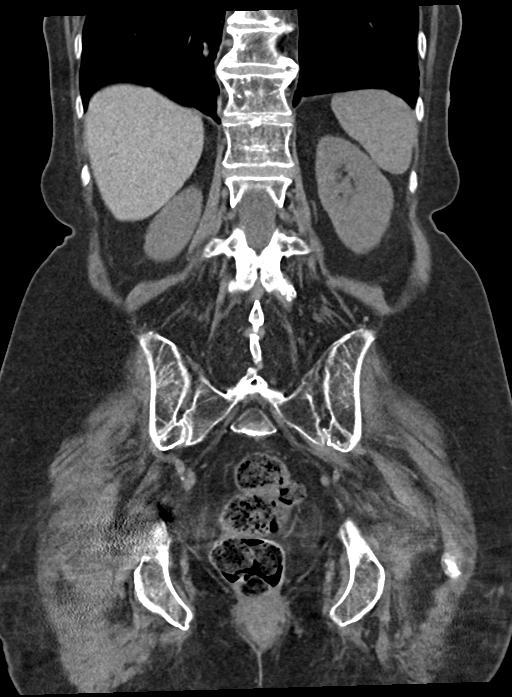
[im 38/86  soft-tissue]
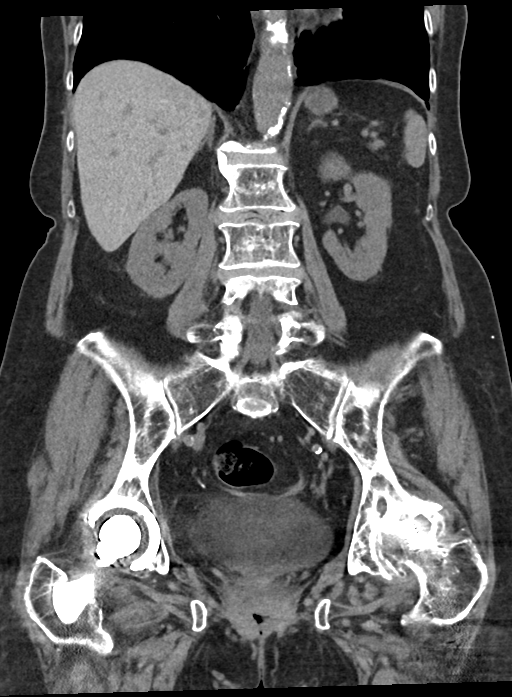
[im 48/86  soft-tissue]
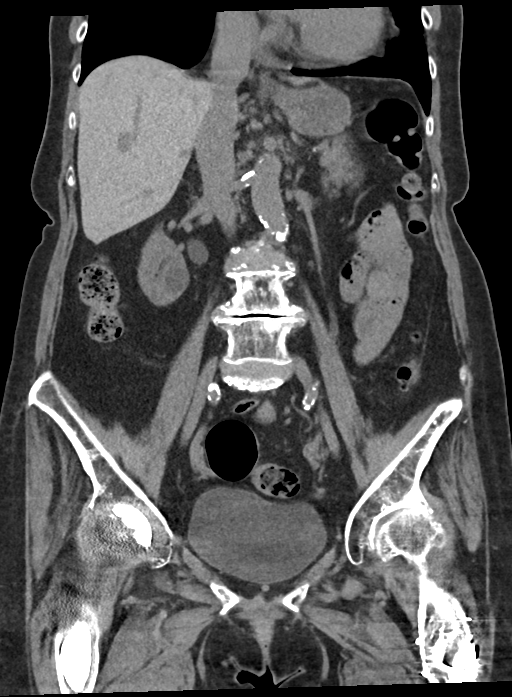

[15 of 46 positions shown; findings below may reference images not displayed]

FINDINGS: Lower chest: Slight fibrosis in the lung bases. Mild cardiac
enlargement with minimal pericardial effusion.

Hepatobiliary: The overall density of the liver is increased in
comparison to the spleen, with liver density measuring 94 Hounsfield
units and spleen 55 Hounsfield units. This may indicate hepatic iron
deposition or possibly medication effect. No focal lesions. Mildly
increased density in the gallbladder without discrete stone, likely
sludge or milk of calcium. No bile duct dilatation.

Pancreas: Unremarkable. No pancreatic ductal dilatation or
surrounding inflammatory changes.

Spleen: Normal in size without focal abnormality.

Adrenals/Urinary Tract: Adrenal glands are unremarkable. Kidneys are
normal, without renal calculi, focal lesion, or hydronephrosis.
Bladder is unremarkable.

Stomach/Bowel: Stomach, small bowel, and colon are not abnormally
distended. Scattered stool throughout the colon. No wall thickening
or inflammatory changes. Diverticula in the sigmoid colon without
evidence of acute diverticulitis. Appendix is normal.

Vascular/Lymphatic: Calcification of the aorta. No aneurysm. No
significant lymphadenopathy.

Reproductive: Status post hysterectomy. No adnexal masses.

Other: No abdominal wall hernia or abnormality. No abdominopelvic
ascites.

Musculoskeletal: Right hip arthroplasty. Internal fixation of the
left hip. Degenerative changes in the spine and left hip. No
destructive bone lesions.
IMPRESSION: 1. No renal or ureteral stone or obstruction.
2. No evidence of bowel obstruction or inflammation.
3. Diffusely increased density in the liver may indicate iron
deposition or medication effect.
4. Sludge or milk of calcium in the gallbladder without inflammatory
change.
5. Cardiac enlargement with minimal pericardial effusion.

## 2021-04-17 IMAGING — DX DG LUMBAR SPINE COMPLETE 4+V
5 series · 5 of 5 positions shown · non-contrast
Comparison: None.

CLINICAL DATA: Bilateral low back pain since [REDACTED].

EXAM:
LUMBAR SPINE - COMPLETE 4+ VIEW

[l-spine ap]
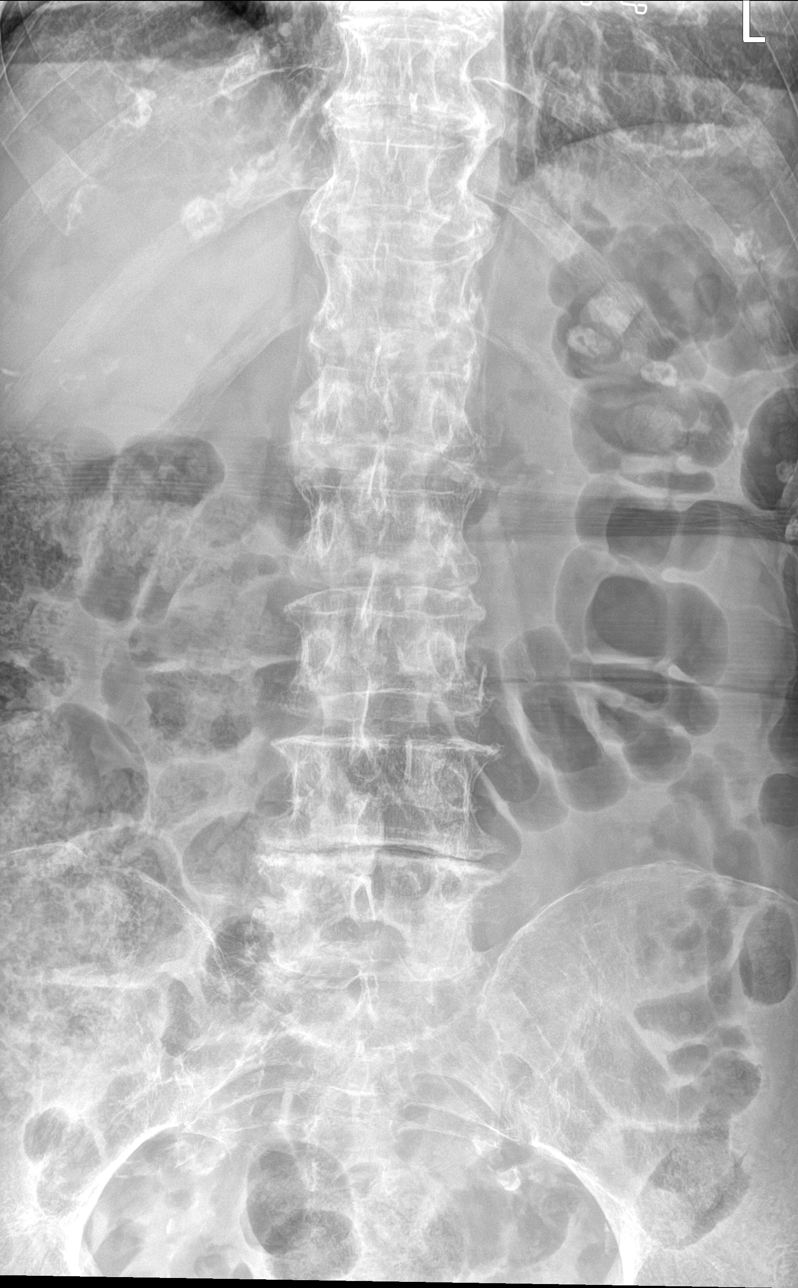

[l-spine obl (1 of 2)]
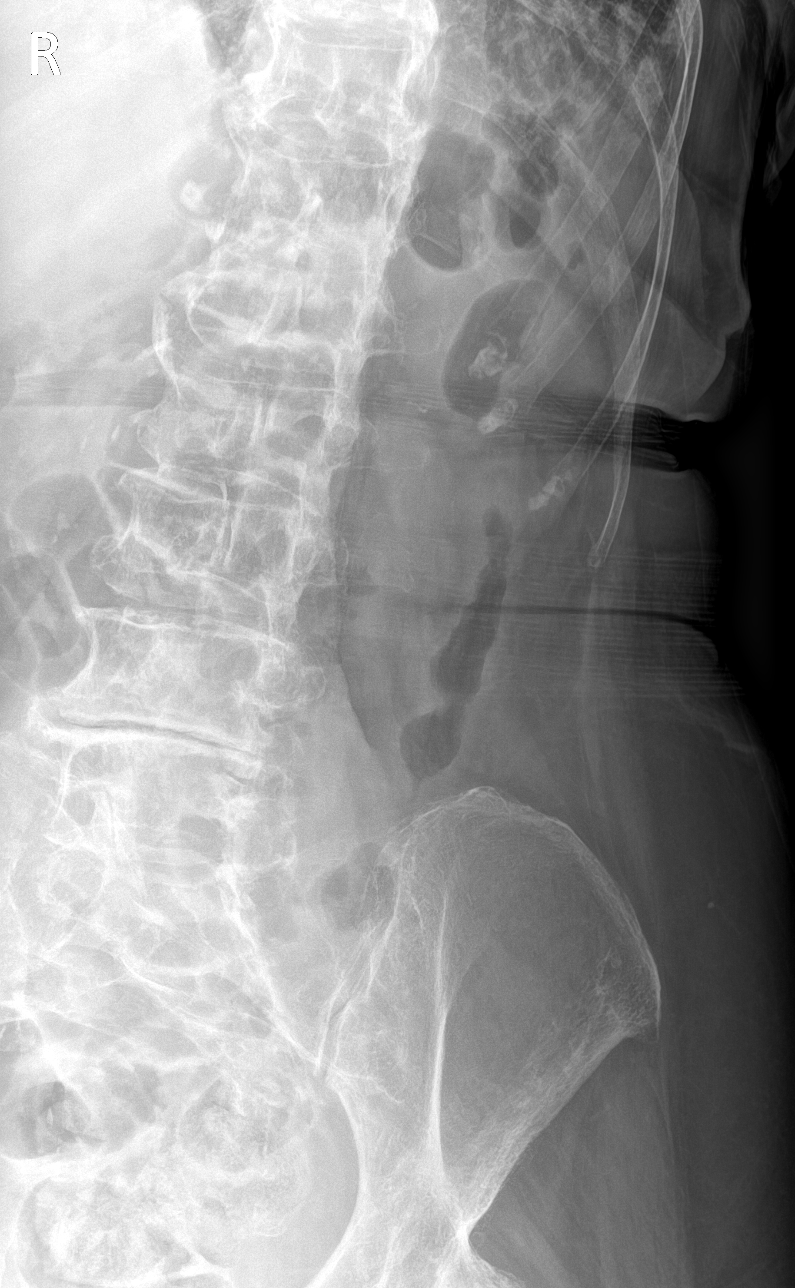

[l-spine obl (2 of 2)]
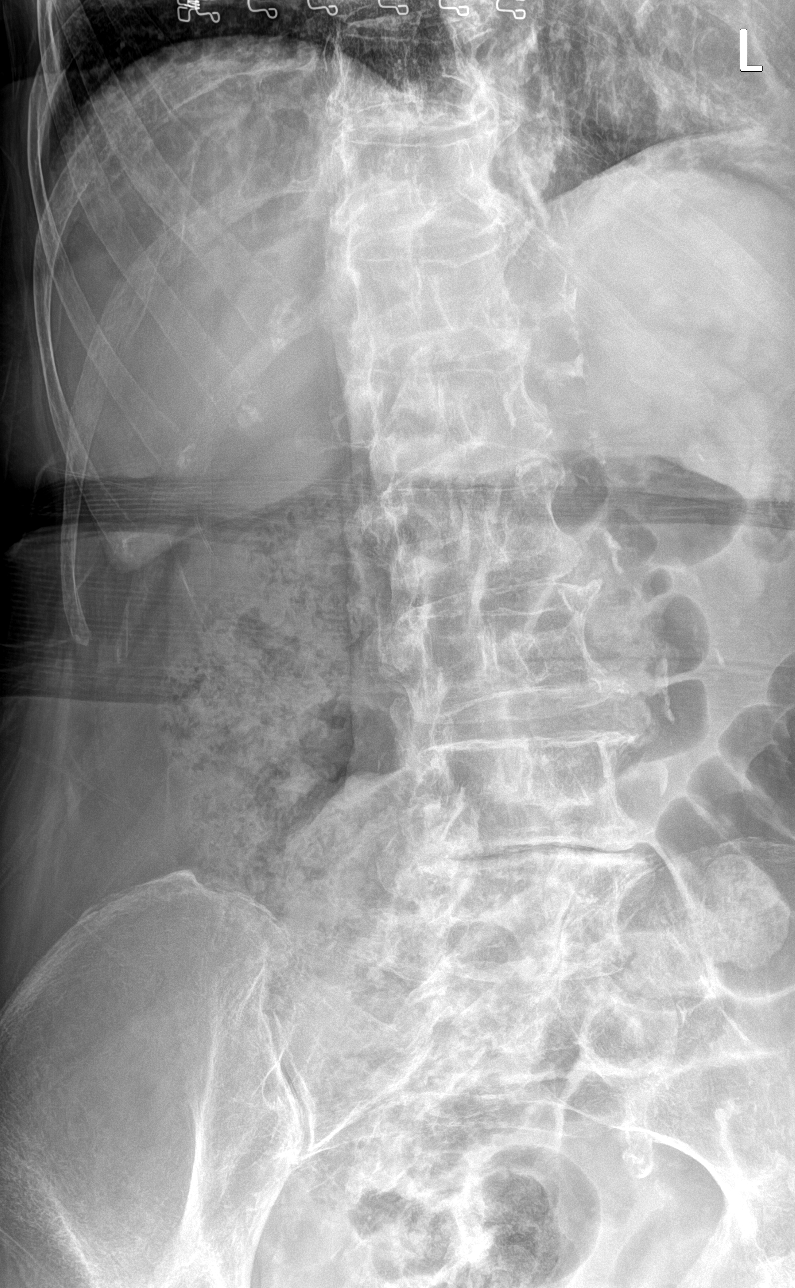

[l-spine spot]
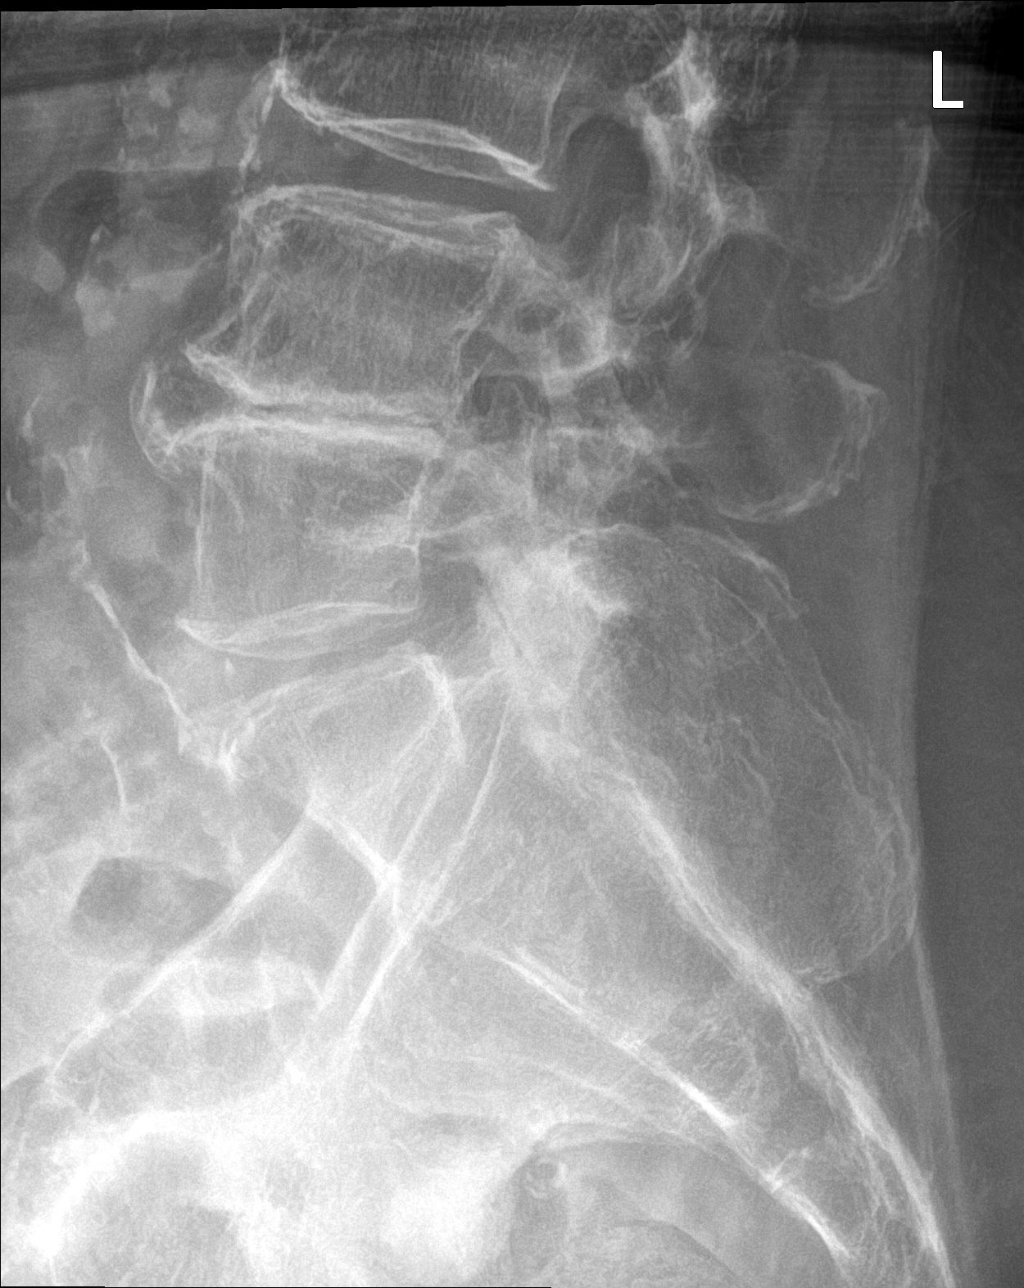

[l-spine lat]
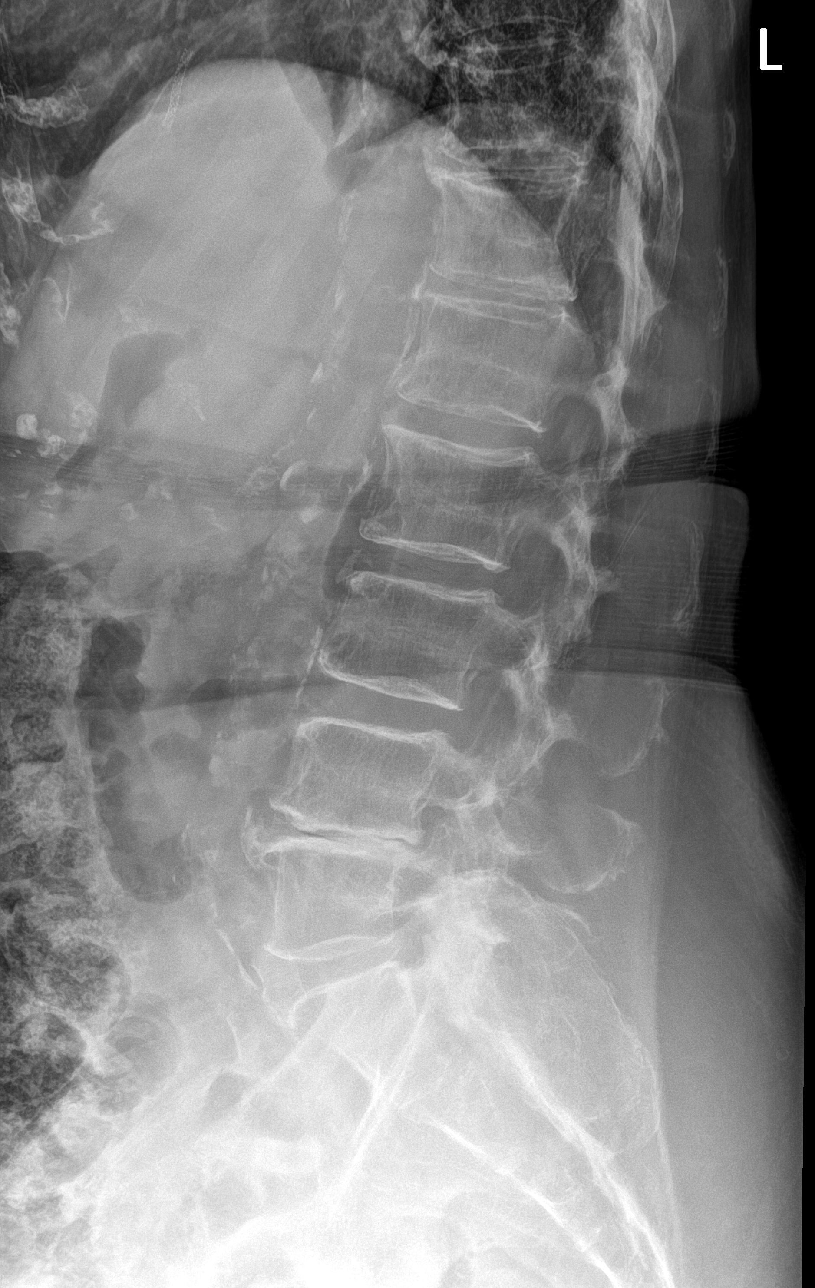

[5 of 5 positions shown; findings below may reference images not displayed]

FINDINGS: Five lumbar type vertebral bodies. Normal alignment. No vertebral
compression deformities. No focal bone lesion or bone destruction.
Degenerative changes throughout with narrowed interspaces and
endplate osteophyte formation. Degenerative changes are most
prominent at L4-5. Degenerative changes also in the facet joints.
Vascular calcifications in the aorta. Visualized sacrum appears
intact.
IMPRESSION: Degenerative changes throughout the lumbar spine. Normal alignment.
No acute displaced fractures identified.

## 2021-04-17 MED ORDER — METOPROLOL TARTRATE 25 MG PO TABS
50.0000 mg | ORAL_TABLET | Freq: Once | ORAL | Status: AC
  Administered 2021-04-17: 50 mg via ORAL
  Filled 2021-04-17: qty 2

## 2021-04-17 MED ORDER — OXYCODONE-ACETAMINOPHEN 5-325 MG PO TABS: 1.0000 | ORAL_TABLET | Freq: Four times a day (QID) | ORAL | 0 refills | Status: AC | PRN

## 2021-04-17 MED ORDER — OXYCODONE HCL 5 MG PO TABS
5.0000 mg | ORAL_TABLET | Freq: Once | ORAL | Status: AC
  Administered 2021-04-17: 5 mg via ORAL
  Filled 2021-04-17: qty 1

## 2021-04-17 NOTE — ED Triage Notes (Signed)
Pt reports bilateral lower back pain since Thursday. Denies any urinary symptoms or injury.

## 2021-04-17 NOTE — ED Provider Notes (Signed)
[facility] EMERGENCY DEPT Provider Note   CSN: [id number] Arrival date & time: 04/17/21  1749     History Chief Complaint  Patient presents with   Back Pain    Brooke [name] is a 86 y.o. female.  Pt presents to the ED today with low back pain.  Pt has had the pain since Thursday, 10/6.  Pt has been taking otc meds without improvement in sx.  Pt denies any falls.  No f/c.  No urinary sx.  No radiation.  She is able to ambulate.      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     Family History  Problem Relation Age of Onset   CVA Mother    CAD Father    Aortic stenosis Sister     Social History   Tobacco Use   Smoking status: Never   Smokeless tobacco: Never  Substance Use Topics   Alcohol use: Never   Drug use: Never    Home Medications Prior to Admission medications   Medication Sig Start Date End Date Taking? Authorizing Provider  oxyCODONE-acetaminophen (PERCOCET/ROXICET) 5-325 MG tablet Take 1 tablet by mouth every 6 (six) hours as needed for severe pain. 04/17/21  Yes Jacalyn Lefevre, MD  acetaminophen (TYLENOL) 325 MG tablet Take 650 mg by mouth every 6 (six) hours as needed  for mild pain.  05/08/14   [provider]  ALPRAZolam Prudy Feeler) 0.5 MG tablet TAKE 1 TABLET BY MOUTH  TWICE DAILY AS NEEDED FOR  ANXIETY 01/06/21   Philip Aspen, Limmie Patricia, MD  amiodarone (PACERONE) 200 MG tablet Take 200 mg by mouth daily.    [provider]  amLODipine (NORVASC) 5 MG tablet Take 1 tablet (5 mg total) by mouth daily. 12/09/20   Lanier Prude, MD  apixaban (ELIQUIS) 5 MG TABS tablet Take 1 tablet (5 mg total) by mouth 2 (two) times daily. 10/05/20   Lanier Prude, MD  aspirin 81 MG EC tablet Take 81 mg by mouth daily.  11/24/16   [provider]  atorvastatin (LIPITOR) 40 MG tablet Take 40 mg by mouth daily.  11/23/16   [provider]  carboxymethylcellul-glycerin (OPTIVE) 0.5-0.9 % ophthalmic solution Place 1-2 drops into both eyes 3 (three) times daily as needed for dry eyes. Thera Tears    [provider]  Cholecalciferol 25 MCG (1000 UT) tablet Take 1,000 Units by mouth daily.     [provider]  dorzolamide-timolol (COSOPT) 22.3-6.8 MG/ML ophthalmic solution Place 1 drop into the right eye 2 (two) times daily. 08/26/20   [provider]  hydrocortisone cream 1 % Apply 1 application topically daily as needed for itching.    [provider]  latanoprost (XALATAN) 0.005 % ophthalmic solution INSTILL 1 DROP INTO BOTH  EYES AT BEDTIME Patient taking differently: INSTILL 1 DROP INTO BOTH  EYES AT BEDTIME 03/30/20   Philip Aspen, Limmie Patricia, MD  losartan (COZAAR) 50 MG tablet Take 1 tablet (50 mg total) by mouth daily. 10/22/20   Jodelle Red, MD  metoprolol tartrate (LOPRESSOR) 50 MG tablet Take 1 tablet (50 mg total) by mouth 2 (two) times daily. 12/09/20   Lanier Prude, MD  OXYGEN Inhale 2 L into the lungs daily as needed (if she feels bad).    [provider]  sertraline (ZOLOFT) 100 MG tablet Take 1 tablet (100 mg total) by mouth daily. 09/28/20   Philip Aspen, Limmie Patricia, MD     Allergies    Hydrocodone-acetaminophen  Review of Systems   Review of Systems  Musculoskeletal:  Positive for back pain.  All other systems reviewed and are negative.  Physical Exam Updated Vital Signs BP 132/75 (BP Location: Right Arm)   Pulse 90   Temp 98.2 F (36.8 C) (Temporal)   Resp 16   Ht 5\' 6"  (1.676 m)   Wt 64.9 kg   SpO2 99%   BMI 23.08 kg/m   Physical Exam Vitals and nursing note reviewed.  Constitutional:      Appearance: Normal appearance.  HENT:     Head: Normocephalic and atraumatic.     Right Ear: External ear normal.     Left Ear: External ear normal.     Nose: Nose normal.     Mouth/Throat:     Mouth: Mucous membranes are moist.     Pharynx: Oropharynx is clear.  Eyes:     Extraocular Movements: Extraocular movements intact.     Conjunctiva/sclera: Conjunctivae normal.     Pupils: Pupils are equal, round, and reactive to light.  Cardiovascular:     Rate and Rhythm: Normal rate and regular rhythm.     Pulses: Normal pulses.     Heart sounds: Normal heart sounds.  Pulmonary:     Effort: Pulmonary effort is normal.     Breath sounds: Normal breath sounds.  Abdominal:     General: Abdomen is flat. Bowel sounds are normal.     Palpations: Abdomen is soft.  Musculoskeletal:        General: Normal range of motion.     Cervical back: Normal range of motion and neck supple.       Back:  Skin:    General: Skin is warm.     Capillary Refill: Capillary refill takes less than 2 seconds.     Findings: No rash.  Neurological:     General: No focal deficit present.     Mental Status: She is alert and oriented to person, place, and time.  Psychiatric:        Mood and Affect: Mood normal.        Behavior: Behavior normal.    ED Results / Procedures / Treatments   Labs (all labs ordered are listed, but only abnormal results are displayed) Labs Reviewed  URINALYSIS, ROUTINE W REFLEX MICROSCOPIC - Abnormal; Notable for the following components:       Result Value   Color, Urine COLORLESS (*)    Specific Gravity, Urine <1.005 (*)    Leukocytes,Ua TRACE (*)    All other components within normal limits  EKG None  Radiology DG Lumbar Spine Complete  Result Date: 04/17/2021 CLINICAL DATA:  Bilateral low back pain since Thursday. EXAM: LUMBAR SPINE - COMPLETE 4+ VIEW COMPARISON:  None. FINDINGS: Five lumbar type vertebral bodies. Normal alignment. No vertebral compression deformities. No focal bone lesion or bone destruction. Degenerative changes throughout with narrowed interspaces and endplate osteophyte formation. Degenerative changes are most prominent at L4-5. Degenerative changes also in the facet joints. Vascular calcifications in the aorta. Visualized sacrum appears intact. IMPRESSION: Degenerative changes throughout the lumbar spine. Normal alignment. No acute displaced fractures identified. Electronically Signed   By: Burman Nieves M.D.   On: 04/17/2021 20:27   CT Renal Stone Study  Result Date: 04/17/2021 CLINICAL DATA:  Flank pain, kidney stone suspected. Bilateral lower back pain since Thursday. EXAM: CT ABDOMEN AND PELVIS WITHOUT CONTRAST TECHNIQUE: Multidetector CT imaging of the abdomen and pelvis was performed following the standard protocol without IV contrast. COMPARISON:  None. FINDINGS: Lower chest: Slight fibrosis in the lung bases. Mild cardiac enlargement with minimal pericardial effusion. Hepatobiliary: The overall density of the liver is increased in comparison to the spleen, with liver density measuring 94 Hounsfield units and spleen 55 Hounsfield units. This may indicate hepatic iron deposition or possibly medication effect. No focal lesions. Mildly increased density in the gallbladder without discrete stone, likely sludge or milk of calcium. No bile duct dilatation. Pancreas: Unremarkable. No pancreatic ductal dilatation or surrounding inflammatory changes. Spleen: Normal in size without focal abnormality.  Adrenals/Urinary Tract: Adrenal glands are unremarkable. Kidneys are normal, without renal calculi, focal lesion, or hydronephrosis. Bladder is unremarkable. Stomach/Bowel: Stomach, small bowel, and colon are not abnormally distended. Scattered stool throughout the colon. No wall thickening or inflammatory changes. Diverticula in the sigmoid colon without evidence of acute diverticulitis. Appendix is normal. Vascular/Lymphatic: Calcification of the aorta. No aneurysm. No significant lymphadenopathy. Reproductive: Status post hysterectomy. No adnexal masses. Other: No abdominal wall hernia or abnormality. No abdominopelvic ascites. Musculoskeletal: Right hip arthroplasty. Internal fixation of the left hip. Degenerative changes in the spine and left hip. No destructive bone lesions. IMPRESSION: 1. No renal or ureteral stone or obstruction. 2. No evidence of bowel obstruction or inflammation. 3. Diffusely increased density in the liver may indicate iron deposition or medication effect. 4. Sludge or milk of calcium in the gallbladder without inflammatory change. 5. Cardiac enlargement with minimal pericardial effusion. Electronically Signed   By: Burman Nieves M.D.   On: 04/17/2021 21:39    Procedures Procedures   Medications Ordered in ED Medications  oxyCODONE (Oxy IR/ROXICODONE) immediate release tablet 5 mg (5 mg Oral Given 04/17/21 2224)  metoprolol tartrate (LOPRESSOR) tablet 50 mg (50 mg Oral Given 04/17/21 2225)    ED Course  I have reviewed the triage vital signs and the nursing notes.  Pertinent labs & imaging results that were available during my care of the patient were reviewed by me and considered in my medical decision making (see chart for details).    MDM Rules/Calculators/A&P                           Pt is feeling better after meds.  Pt has no evidence of any red flags.  Imaging ok.  Pt is stable for d/c.  Return if worse.  F/u with pcp. Final Clinical Impression(s) / ED  Diagnoses Final diagnoses:  Strain of lumbar region, initial encounter    Rx / DC Orders ED Discharge Orders  Ordered    oxyCODONE-acetaminophen (PERCOCET/ROXICET) 5-325 MG tablet  Every 6 hours PRN        04/17/21 2254             Jacalyn Lefevre, MD 04/17/21 2255

## 2021-04-18 NOTE — Telephone Encounter (Signed)
Lvm for pt to call back to schedule °

## 2021-04-20 ENCOUNTER — Ambulatory Visit (INDEPENDENT_AMBULATORY_CARE_PROVIDER_SITE_OTHER): Payer: Medicare Other | Admitting: Orthopaedic Surgery

## 2021-04-20 DIAGNOSIS — M545 Low back pain, unspecified: Secondary | ICD-10-CM | POA: Diagnosis not present

## 2021-04-20 DIAGNOSIS — I25119 Atherosclerotic heart disease of native coronary artery with unspecified angina pectoris: Secondary | ICD-10-CM

## 2021-04-20 MED ORDER — PREDNISONE 50 MG PO TABS: ORAL_TABLET | ORAL | 0 refills | Status: AC

## 2021-04-20 MED ORDER — BACLOFEN 10 MG PO TABS: 10.0000 mg | ORAL_TABLET | Freq: Three times a day (TID) | ORAL | 0 refills | Status: AC | PRN

## 2021-04-20 NOTE — Progress Notes (Signed)
The patient is a very frail 86 year old female we have seen before.  She comes in with a recent history of low back pain to the right side in the midline.  She denies any radicular component.  She has a very remote history of a discectomy done years ago in [facility].  She gets a sharp stabbing pain in her back.  She went to the emergency room and x-rays were obtained of her lumbar spine.  She also had a renal CT study done to rule out renal stones given the severity of her pain.  They put her on oxycodone and that has really not helped her at all and made her just too sleepy and nauseated.  She is in a wheelchair today.  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  She is also on Eliquis and has significant comorbidities.  She still denies any numbness and tingling going down her legs.  She is not a diabetic.  She says the pain is only on her lumbar spine to the midline into the right and not the left.  On exam she does have pain in the lower aspect of the lumbar spine more to the right side.  She does not have a positive straight leg raise.  She is weak in both of her legs but I think this is more of conditioning.  I did review the plain films of her lumbar spine as well as a CT renal study where we can see this under bone images.  She does have severe degenerative disc disease between L4 and L5 and quite severe facet arthritis at L5-S1 bilaterally.  I am going to try 5 days of prednisone 50 mg for her to see if this will help since she is not a diabetic as well as some baclofen.  Her and her husband agree with this treatment plan.  We will see her back in just a week to see if this is helped.  Our next step would be considering sending her to Dr. Alvester Morin for a right-sided facet joint injection.  She understands she would have to stop Eliquis for that type of injection.  Again we will reevaluate her in just a week.

## 2021-04-26 ENCOUNTER — Encounter: Payer: Self-pay | Admitting: Vascular Surgery

## 2021-04-26 ENCOUNTER — Other Ambulatory Visit: Payer: Self-pay

## 2021-04-26 ENCOUNTER — Ambulatory Visit (INDEPENDENT_AMBULATORY_CARE_PROVIDER_SITE_OTHER): Payer: Medicare Other | Admitting: Vascular Surgery

## 2021-04-26 DIAGNOSIS — I872 Venous insufficiency (chronic) (peripheral): Secondary | ICD-10-CM

## 2021-04-26 NOTE — Progress Notes (Signed)
Patient name: Brooke [name] MRN: [id number] DOB: 1935/04/12 Sex: female  REASON FOR CONSULT: Evaluate for chronic venous insufficiency  HPI: Brooke [name] is a 86 y.o. female, with history of coronary artery disease and atrial fibrillation who presents for evaluation of chronic venous insufficiency.  She describes bilateral lower extremity swelling with some discomfort at the ankles that has been ongoing for some duration.  She is unclear about the exact timeline.  She denies any history of trauma or DVT.  She has noticed some prominent veins in her right leg.  She does wear thigh-high compression stockings.  She is ambulatory with a walker.  Past Medical History:  Diagnosis Date   Atrial fibrillation (HCC)    CAD (coronary artery disease)    Glaucoma    Hyperlipidemia    OA (osteoarthritis) of hip    Tachycardia     Past Surgical History:  Procedure Laterality Date   ABLATION     LEFT HEART CATH AND CORONARY ANGIOGRAPHY N/A 08/19/2020   Procedure: LEFT HEART CATH AND CORONARY ANGIOGRAPHY;  Surgeon: Lyn Records, MD;  Location: [facility];  Service: Cardiovascular;  Laterality: N/A;    Family History  Problem Relation Age of Onset   CVA Mother    CAD Father    Aortic stenosis Sister     SOCIAL HISTORY: Social History   Socioeconomic History   Marital status: Married    Spouse name: Not on file   Number of children: Not on file   Years of education: Not on file   Highest education level: Not on file  Occupational History   Not on file  Tobacco Use   Smoking status: Never   Smokeless tobacco: Never  Substance and Sexual Activity   Alcohol use: Never   Drug use: Never   Sexual activity: Not on file  Other Topics Concern   Not on file  Social History Narrative   Not on file   Social Determinants of Health   Financial Resource Strain: Not on file  Food Insecurity: Not on file  Transportation Needs: Not on file  Physical Activity: Not on file  Stress:  Not on file  Social Connections: Not on file  Intimate Partner Violence: Not on file    Allergies  Allergen Reactions   Hydrocodone-Acetaminophen Anaphylaxis    Current Outpatient Medications  Medication Sig Dispense Refill   acetaminophen (TYLENOL) 325 MG tablet Take 650 mg by mouth every 6 (six) hours as needed for mild pain.      ALPRAZolam (XANAX) 0.5 MG tablet TAKE 1 TABLET BY MOUTH  TWICE DAILY AS NEEDED FOR  ANXIETY 60 tablet 1   amiodarone (PACERONE) 200 MG tablet Take 200 mg by mouth daily.     amLODipine (NORVASC) 5 MG tablet Take 1 tablet (5 mg total) by mouth daily. 90 tablet 3   apixaban (ELIQUIS) 5 MG TABS tablet Take 1 tablet (5 mg total) by mouth 2 (two) times daily. 180 tablet 1   aspirin 81 MG EC tablet Take 81 mg by mouth daily.      atorvastatin (LIPITOR) 40 MG tablet Take 40 mg by mouth daily.      baclofen (LIORESAL) 10 MG tablet Take 1 tablet (10 mg total) by mouth 3 (three) times daily as needed for muscle spasms. 40 each 0   carboxymethylcellul-glycerin (OPTIVE) 0.5-0.9 % ophthalmic solution Place 1-2 drops into both eyes 3 (three) times daily as needed for dry eyes. Thera Tears  Cholecalciferol 25 MCG (1000 UT) tablet Take 1,000 Units by mouth daily.      dorzolamide-timolol (COSOPT) 22.3-6.8 MG/ML ophthalmic solution Place 1 drop into the right eye 2 (two) times daily.     hydrocortisone cream 1 % Apply 1 application topically daily as needed for itching.     latanoprost (XALATAN) 0.005 % ophthalmic solution INSTILL 1 DROP INTO BOTH  EYES AT BEDTIME (Patient taking differently: INSTILL 1 DROP INTO BOTH  EYES AT BEDTIME) 5 mL 5   losartan (COZAAR) 50 MG tablet Take 1 tablet (50 mg total) by mouth daily. 90 tablet 3   metoprolol tartrate (LOPRESSOR) 50 MG tablet Take 1 tablet (50 mg total) by mouth 2 (two) times daily. 180 tablet 3   OXYGEN Inhale 2 L into the lungs daily as needed (if she feels bad).     sertraline (ZOLOFT) 100 MG tablet Take 1 tablet (100 mg  total) by mouth daily. 90 tablet 1   oxyCODONE-acetaminophen (PERCOCET/ROXICET) 5-325 MG tablet Take 1 tablet by mouth every 6 (six) hours as needed for severe pain. (Patient not taking: Reported on 04/26/2021) 10 tablet 0   predniSONE (DELTASONE) 50 MG tablet Take one tablet daily for 5 days. (Patient not taking: Reported on 04/26/2021) 5 tablet 0   No current facility-administered medications for this visit.    REVIEW OF SYSTEMS:  [X]  denotes positive finding, [ ]  denotes negative finding Cardiac  Comments:  Chest pain or chest pressure:    Shortness of breath upon exertion:    Short of breath when lying flat:    Irregular heart rhythm:        Vascular    Pain in calf, thigh, or hip brought on by ambulation:    Pain in feet at night that wakes you up from your sleep:     Blood clot in your veins:    Leg swelling:  x       Pulmonary    Oxygen at home:    Productive cough:     Wheezing:         Neurologic    Sudden weakness in arms or legs:     Sudden numbness in arms or legs:     Sudden onset of difficulty speaking or slurred speech:    Temporary loss of vision in one eye:     Problems with dizziness:         Gastrointestinal    Blood in stool:     Vomited blood:         Genitourinary    Burning when urinating:     Blood in urine:        Psychiatric    Major depression:         Hematologic    Bleeding problems:    Problems with blood clotting too easily:        Skin    Rashes or ulcers:        Constitutional    Fever or chills:      PHYSICAL EXAM: Vitals:   04/26/21 1042  BP: (!) 143/73  Pulse: 63  Resp: 16  Temp: (!) 96 F (35.6 C)  TempSrc: Temporal  SpO2: 96%  Weight: 141 lb (64 kg)  Height: 5\' 7"  (1.702 m)    GENERAL: The patient is a well-nourished female, in no acute distress. The vital signs are documented above. CARDIAC: There is a regular rate and rhythm.  VASCULAR:  Bilateral DP pulses palpable Spider veins right calf  with small  varicosity right lateral thigh PULMONARY: No respiratory distress ABDOMEN: Soft and non-tender MUSCULOSKELETAL: There are no major deformities or cyanosis. NEUROLOGIC: No focal weakness or paresthesias are detected. SKIN: There are no ulcers or rashes noted. PSYCHIATRIC: The patient has a normal affect.  DATA:   Right lower extremity venous reflux shows no evidence of DVT and only deep reflux in the common femoral vein  Assessment/Plan:  86 year old female presents for evaluation of lower extremity swelling.  I discussed after review of her reflux study she does have evidence of venous insufficiency but only in the deep system.  Her exam is consistent with CEAP classification C3 given only swelling.  Discussed in the setting of deep venous reflux we recommend conservative therapy including leg elevation, compression and exercise.  She is already wearing thigh-high stockings appropriately.  There is no role for surgical intervention or laser ablation.  Happy to see her in the future if questions or concerns arise.   Cephus Shelling, MD [facility] Office: [phone]

## 2021-04-27 ENCOUNTER — Encounter: Payer: Self-pay | Admitting: Physician Assistant

## 2021-04-27 ENCOUNTER — Ambulatory Visit (INDEPENDENT_AMBULATORY_CARE_PROVIDER_SITE_OTHER): Payer: Medicare Other | Admitting: Physician Assistant

## 2021-04-27 DIAGNOSIS — M5136 Other intervertebral disc degeneration, lumbar region: Secondary | ICD-10-CM

## 2021-04-27 DIAGNOSIS — I25119 Atherosclerotic heart disease of native coronary artery with unspecified angina pectoris: Secondary | ICD-10-CM | POA: Diagnosis not present

## 2021-04-27 NOTE — Progress Notes (Signed)
HPI: [name] returns today follow-up of her low back pain.  She states the prednisone did help some.  She still having sharp pain in the her back mainly on the right.  She describes low back pain.  Pain is described as 5 out of 10 pain at worst.  No radicular symptoms down either leg.  She is wearing a lift in her left shoe due to leg length discrepancy acquired.  She tried taking the baclofen this made her very sleepy as she was taking Zanaflex also.  She is now just gone back to her Zanaflex.  Physical exam: General well-developed well-nourished female seated in wheelchair in no acute distress. Bilateral hips good range of motion of both hips without pain.  Negative straight leg raise bilaterally.  Tenderness over the lower lumbar spine particularly over the right paraspinous region.  Impression: Lumbar degenerative disc disease  Plan: We will refer to Dr. Alvester [name] for right facet injection possibly at L5-S1.  Have her follow-up 2 weeks after the injection and see how she is doing overall.  Questions encouraged and answered at length

## 2021-04-27 NOTE — Addendum Note (Signed)
Addended by: Barbette Or on: 04/27/2021 04:18 PM   Modules accepted: Orders

## 2021-05-02 ENCOUNTER — Other Ambulatory Visit: Payer: Self-pay | Admitting: Internal Medicine

## 2021-05-02 DIAGNOSIS — F411 Generalized anxiety disorder: Secondary | ICD-10-CM

## 2021-05-05 ENCOUNTER — Other Ambulatory Visit: Payer: Self-pay | Admitting: Cardiology

## 2021-05-17 ENCOUNTER — Ambulatory Visit (INDEPENDENT_AMBULATORY_CARE_PROVIDER_SITE_OTHER): Payer: Medicare Other | Admitting: Physical Medicine and Rehabilitation

## 2021-05-17 ENCOUNTER — Ambulatory Visit: Payer: Self-pay

## 2021-05-17 ENCOUNTER — Encounter: Payer: Self-pay | Admitting: Physical Medicine and Rehabilitation

## 2021-05-17 VITALS — BP 153/80 | HR 66

## 2021-05-17 DIAGNOSIS — M47816 Spondylosis without myelopathy or radiculopathy, lumbar region: Secondary | ICD-10-CM

## 2021-05-17 MED ORDER — METHYLPREDNISOLONE ACETATE 80 MG/ML IJ SUSP
80.0000 mg | Freq: Once | INTRAMUSCULAR | Status: AC
  Administered 2021-05-17: 80 mg

## 2021-05-17 NOTE — Patient Instructions (Signed)

## 2021-05-17 NOTE — Progress Notes (Signed)
Pt state lower back pain that mostly on her right side. Pt state her left leg hurts.  Pt state walking, standing and laying down makes the pain worse. Pt state she takes pain meds to help ease her pain. Pt has hx of inj in April that didn't help.  Numeric Pain Rating Scale and Functional Assessment Average Pain 5   In the last MONTH (on 0-10 scale) has pain interfered with the following?  1. General activity like being  able to carry out your everyday physical activities such as walking, climbing stairs, carrying groceries, or moving a chair?  Rating(7)   +Driver, +BT, -Dye Allergies.

## 2021-05-18 ENCOUNTER — Other Ambulatory Visit: Payer: Self-pay | Admitting: Internal Medicine

## 2021-05-22 NOTE — Procedures (Signed)
Lumbar Facet Joint Intra-Articular Injection(s) with Fluoroscopic Guidance  Patient: Brooke [name]      Date of Birth: [birth year] MRN: [id number] PCP: Philip Aspen, Limmie Patricia, MD      Visit Date: 05/17/2021   Universal Protocol:    Date/Time: 05/17/2021  Consent Given By: the patient  Position: PRONE   Additional Comments: Vital signs were monitored before and after the procedure. Patient was prepped and draped in the usual sterile fashion. The correct patient, procedure, and site was verified.   Injection Procedure Details:  Procedure Site One Meds Administered:  Meds ordered this encounter  Medications   methylPREDNISolone acetate (DEPO-MEDROL) injection 80 mg     Laterality: Right  Location/Site:  L5-S1  Needle size: 22 guage  Needle type: Spinal  Needle Placement: Articular  Findings:  -Comments: Excellent flow of contrast producing a partial arthrogram.  Procedure Details: The fluoroscope beam is vertically oriented in AP, and the inferior recess is visualized beneath the lower pole of the inferior apophyseal process, which represents the target point for needle insertion. When direct visualization is difficult the target point is located at the medial projection of the vertebral pedicle. The region overlying each aforementioned target is locally anesthetized with a 1 to 2 ml. volume of 1% Lidocaine without Epinephrine.   The spinal needle was inserted into each of the above mentioned facet joints using biplanar fluoroscopic guidance. A 0.25 to 0.5 ml. volume of Isovue-250 was injected and a partial facet joint arthrogram was obtained. A single spot film was obtained of the resulting arthrogram.    One to 1.25 ml of the steroid/anesthetic solution was then injected into each of the facet joints noted above.   Additional Comments:  The patient tolerated the procedure well Dressing: 2 x 2 sterile gauze and Band-Aid    Post-procedure details: Patient was  observed during the procedure. Post-procedure instructions were reviewed.  Patient left the clinic in stable condition.

## 2021-05-22 NOTE — Progress Notes (Signed)
Brooke [name] - 86 y.o. female MRN [id number]  Date of birth: 1934-09-14  Office Visit Note: Visit Date: 05/17/2021 PCP: Isaac Bliss, Rayford Halsted, MD Referred by: Isaac Bliss, Estel*  Subjective: Chief Complaint  Patient presents with   Lower Back - Pain   Left Leg - Pain   HPI:  Brooke [name] is a 86 y.o. female who comes in today at the request of Benita Stabile, PA-C for planned Right  L5-S1 Lumbar facet/medial branch block with fluoroscopic guidance.  The patient has failed conservative care including home exercise, medications, time and activity modification.  This injection will be diagnostic and hopefully therapeutic.  Please see requesting physician notes for further details and justification.  Exam has shown concordant pain with facet joint loading.   ROS Otherwise per HPI.  Assessment & Plan: Visit Diagnoses:    ICD-10-CM   1. Spondylosis without myelopathy or radiculopathy, lumbar region  M47.816 XR C-ARM NO REPORT    Facet Injection    methylPREDNISolone acetate (DEPO-MEDROL) injection 80 mg      Plan: No additional findings.   Meds & Orders:  Meds ordered this encounter  Medications   methylPREDNISolone acetate (DEPO-MEDROL) injection 80 mg    Orders Placed This Encounter  Procedures   Facet Injection   XR C-ARM NO REPORT    Follow-up: Return in about 2 weeks (around 05/31/2021) for Benita Stabile, PA-C.   Procedures: No procedures performed  Lumbar Facet Joint Intra-Articular Injection(s) with Fluoroscopic Guidance  Patient: Brooke [name]      Date of Birth: 08/08/1934 MRN: [id number] PCP: Isaac Bliss, Rayford Halsted, MD      Visit Date: 05/17/2021   Universal Protocol:    Date/Time: 05/17/2021  Consent Given By: the patient  Position: PRONE   Additional Comments: Vital signs were monitored before and after the procedure. Patient was prepped and draped in the usual sterile fashion. The correct patient, procedure, and site was verified.   Injection  Procedure Details:  Procedure Site One Meds Administered:  Meds ordered this encounter  Medications   methylPREDNISolone acetate (DEPO-MEDROL) injection 80 mg     Laterality: Right  Location/Site:  L5-S1  Needle size: 22 guage  Needle type: Spinal  Needle Placement: Articular  Findings:  -Comments: Excellent flow of contrast producing a partial arthrogram.  Procedure Details: The fluoroscope beam is vertically oriented in AP, and the inferior recess is visualized beneath the lower pole of the inferior apophyseal process, which represents the target point for needle insertion. When direct visualization is difficult the target point is located at the medial projection of the vertebral pedicle. The region overlying each aforementioned target is locally anesthetized with a 1 to 2 ml. volume of 1% Lidocaine without Epinephrine.   The spinal needle was inserted into each of the above mentioned facet joints using biplanar fluoroscopic guidance. A 0.25 to 0.5 ml. volume of Isovue-250 was injected and a partial facet joint arthrogram was obtained. A single spot film was obtained of the resulting arthrogram.    One to 1.25 ml of the steroid/anesthetic solution was then injected into each of the facet joints noted above.   Additional Comments:  The patient tolerated the procedure well Dressing: 2 x 2 sterile gauze and Band-Aid    Post-procedure details: Patient was observed during the procedure. Post-procedure instructions were reviewed.  Patient left the clinic in stable condition.   Clinical History: No specialty comments available.     Objective:  VS:  HT:    WT:  BMI:     BP:(!) 153/80  HR:66bpm  TEMP: ( )  RESP:  Physical Exam Vitals and nursing note reviewed.  Constitutional:      General: She is not in acute distress.    Appearance: Normal appearance. She is not ill-appearing.  HENT:     Head: Normocephalic and atraumatic.     Right Ear: External ear normal.      Left Ear: External ear normal.  Eyes:     Extraocular Movements: Extraocular movements intact.  Cardiovascular:     Rate and Rhythm: Normal rate.     Pulses: Normal pulses.  Pulmonary:     Effort: Pulmonary effort is normal. No respiratory distress.  Abdominal:     General: There is no distension.     Palpations: Abdomen is soft.  Musculoskeletal:        General: Tenderness present.     Cervical back: Neck supple.     Right lower leg: No edema.     Left lower leg: No edema.     Comments: Patient has good distal strength with no pain over the greater trochanters.  No clonus or focal weakness.  Skin:    Findings: No erythema, lesion or rash.  Neurological:     General: No focal deficit present.     Mental Status: She is alert and oriented to person, place, and time.     Sensory: No sensory deficit.     Motor: No weakness or abnormal muscle tone.     Coordination: Coordination normal.  Psychiatric:        Mood and Affect: Mood normal.        Behavior: Behavior normal.     Imaging: No results found.

## 2021-06-01 ENCOUNTER — Encounter: Payer: Self-pay | Admitting: Physician Assistant

## 2021-06-01 ENCOUNTER — Ambulatory Visit (INDEPENDENT_AMBULATORY_CARE_PROVIDER_SITE_OTHER): Payer: Medicare Other | Admitting: Physician Assistant

## 2021-06-01 ENCOUNTER — Other Ambulatory Visit: Payer: Self-pay

## 2021-06-01 DIAGNOSIS — I25119 Atherosclerotic heart disease of native coronary artery with unspecified angina pectoris: Secondary | ICD-10-CM | POA: Diagnosis not present

## 2021-06-01 DIAGNOSIS — M51369 Other intervertebral disc degeneration, lumbar region without mention of lumbar back pain or lower extremity pain: Secondary | ICD-10-CM

## 2021-06-01 DIAGNOSIS — M5136 Other intervertebral disc degeneration, lumbar region: Secondary | ICD-10-CM | POA: Diagnosis not present

## 2021-06-01 NOTE — Progress Notes (Signed)
Office Visit Note   Patient: Brooke [name]           Date of Birth: Nov 14, 1934           MRN: [id number] Visit Date: 06/01/2021              Requested by: Philip Aspen, Limmie Patricia, MD [address] [place],  [place] [address] PCP: Philip Aspen, Limmie Patricia, MD   Assessment & Plan: Visit Diagnoses:  1. DDD (degenerative disc disease), lumbar     Plan: Given patient's continued low back pain with radicular symptoms down her left leg will recommend MRI to rule out HNP as a source of her radicular symptoms.  Have her follow-up with Dr. Magnus Ivan after the MRI to go over results discuss further treatment.  Questions were encouraged and answered at length today.  Follow-Up Instructions: Return After MRI.   Orders:  No orders of the defined types were placed in this encounter.  No orders of the defined types were placed in this encounter.     Procedures: No procedures performed   Clinical Data: No additional findings.   Subjective: Chief Complaint  Patient presents with   Lower Back - Pain, Follow-up    HPI Mrs. Wake returns today follow-up after undergoing a right L5-S1 articular injection.  She states that the injection helped some gave her some relief for a week.  She states that the injection helped about 10 to 25%.  She continues to have pain down her left leg sometimes down into the foot.  Again she underwent the epidural steroid injection after his CT scan which was done to rule out kidney stones showed severe degenerative disc disease at L4 and L5 with severe facet arthritis bilaterally at L5-S1. She notes she has pain in her low back that wakens her whenever she twists.  She constantly has some low back pain.  No new injury. Review of Systems See HPI.  Objective: Vital Signs: There were no vitals taken for this visit.  Physical Exam General well-developed well-nourished female seated in wheelchair no acute distress. Ortho Exam Lumbar spine she has  tenderness to the paraspinous region on the right and left to palpation.  5 out of 5 strength throughout the lower extremities against resistance.  Positive straight leg raise on the left negative on the right.  Specialty Comments:  No specialty comments available.  Imaging: No results found.   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   Past Medical History:  Diagnosis Date   Atrial fibrillation (HCC)    CAD (coronary artery disease)    Glaucoma    Hyperlipidemia    OA (osteoarthritis) of hip    Tachycardia     Family History  Problem Relation Age of Onset   CVA Mother    CAD Father    Aortic stenosis Sister     Past Surgical History:  Procedure Laterality Date   ABLATION     LEFT HEART CATH AND CORONARY ANGIOGRAPHY N/A 08/19/2020   Procedure: LEFT HEART CATH AND CORONARY ANGIOGRAPHY;  Surgeon: Lyn Records, MD;  Location: [facility];  Service: Cardiovascular;  Laterality: N/A;   Social History   Occupational History   Not on file  Tobacco Use   Smoking status: Never   Smokeless tobacco: Never  Substance and Sexual Activity   Alcohol use: Never   Drug use: Never   Sexual activity: Not on file

## 2021-06-05 ENCOUNTER — Encounter (HOSPITAL_COMMUNITY): Payer: Self-pay | Admitting: Family Medicine

## 2021-06-05 ENCOUNTER — Other Ambulatory Visit: Payer: Self-pay

## 2021-06-05 ENCOUNTER — Inpatient Hospital Stay (HOSPITAL_COMMUNITY)
Admission: EM | Admit: 2021-06-05 | Discharge: 2021-07-10 | DRG: 870 | Disposition: E | Payer: Medicare Other | Attending: Student | Admitting: Student

## 2021-06-05 ENCOUNTER — Emergency Department (HOSPITAL_COMMUNITY): Payer: Medicare Other

## 2021-06-05 DIAGNOSIS — G9341 Metabolic encephalopathy: Secondary | ICD-10-CM | POA: Diagnosis present

## 2021-06-05 DIAGNOSIS — Y92239 Unspecified place in hospital as the place of occurrence of the external cause: Secondary | ICD-10-CM | POA: Diagnosis not present

## 2021-06-05 DIAGNOSIS — F419 Anxiety disorder, unspecified: Secondary | ICD-10-CM | POA: Diagnosis present

## 2021-06-05 DIAGNOSIS — Z66 Do not resuscitate: Secondary | ICD-10-CM | POA: Diagnosis not present

## 2021-06-05 DIAGNOSIS — E876 Hypokalemia: Secondary | ICD-10-CM | POA: Diagnosis not present

## 2021-06-05 DIAGNOSIS — Z79899 Other long term (current) drug therapy: Secondary | ICD-10-CM

## 2021-06-05 DIAGNOSIS — Z9981 Dependence on supplemental oxygen: Secondary | ICD-10-CM | POA: Diagnosis not present

## 2021-06-05 DIAGNOSIS — I10 Essential (primary) hypertension: Secondary | ICD-10-CM | POA: Diagnosis not present

## 2021-06-05 DIAGNOSIS — Z8249 Family history of ischemic heart disease and other diseases of the circulatory system: Secondary | ICD-10-CM | POA: Diagnosis not present

## 2021-06-05 DIAGNOSIS — A419 Sepsis, unspecified organism: Principal | ICD-10-CM | POA: Diagnosis present

## 2021-06-05 DIAGNOSIS — Z885 Allergy status to narcotic agent status: Secondary | ICD-10-CM

## 2021-06-05 DIAGNOSIS — I952 Hypotension due to drugs: Secondary | ICD-10-CM | POA: Diagnosis not present

## 2021-06-05 DIAGNOSIS — R0902 Hypoxemia: Secondary | ICD-10-CM

## 2021-06-05 DIAGNOSIS — J189 Pneumonia, unspecified organism: Secondary | ICD-10-CM | POA: Diagnosis present

## 2021-06-05 DIAGNOSIS — I110 Hypertensive heart disease with heart failure: Secondary | ICD-10-CM | POA: Diagnosis present

## 2021-06-05 DIAGNOSIS — L89131 Pressure ulcer of right lower back, stage 1: Secondary | ICD-10-CM | POA: Diagnosis present

## 2021-06-05 DIAGNOSIS — R29898 Other symptoms and signs involving the musculoskeletal system: Secondary | ICD-10-CM | POA: Diagnosis not present

## 2021-06-05 DIAGNOSIS — L8990 Pressure ulcer of unspecified site, unspecified stage: Secondary | ICD-10-CM | POA: Insufficient documentation

## 2021-06-05 DIAGNOSIS — J9601 Acute respiratory failure with hypoxia: Secondary | ICD-10-CM | POA: Diagnosis present

## 2021-06-05 DIAGNOSIS — F32A Depression, unspecified: Secondary | ICD-10-CM | POA: Diagnosis present

## 2021-06-05 DIAGNOSIS — K5900 Constipation, unspecified: Secondary | ICD-10-CM

## 2021-06-05 DIAGNOSIS — L89141 Pressure ulcer of left lower back, stage 1: Secondary | ICD-10-CM | POA: Diagnosis present

## 2021-06-05 DIAGNOSIS — M25561 Pain in right knee: Secondary | ICD-10-CM | POA: Diagnosis present

## 2021-06-05 DIAGNOSIS — Z7952 Long term (current) use of systemic steroids: Secondary | ICD-10-CM | POA: Diagnosis not present

## 2021-06-05 DIAGNOSIS — Z7982 Long term (current) use of aspirin: Secondary | ICD-10-CM | POA: Diagnosis not present

## 2021-06-05 DIAGNOSIS — Z823 Family history of stroke: Secondary | ICD-10-CM

## 2021-06-05 DIAGNOSIS — R0609 Other forms of dyspnea: Secondary | ICD-10-CM | POA: Diagnosis not present

## 2021-06-05 DIAGNOSIS — Z4659 Encounter for fitting and adjustment of other gastrointestinal appliance and device: Secondary | ICD-10-CM

## 2021-06-05 DIAGNOSIS — T4275XA Adverse effect of unspecified antiepileptic and sedative-hypnotic drugs, initial encounter: Secondary | ICD-10-CM | POA: Diagnosis not present

## 2021-06-05 DIAGNOSIS — Z515 Encounter for palliative care: Secondary | ICD-10-CM

## 2021-06-05 DIAGNOSIS — R54 Age-related physical debility: Secondary | ICD-10-CM | POA: Diagnosis present

## 2021-06-05 DIAGNOSIS — H409 Unspecified glaucoma: Secondary | ICD-10-CM | POA: Diagnosis present

## 2021-06-05 DIAGNOSIS — R9431 Abnormal electrocardiogram [ECG] [EKG]: Secondary | ICD-10-CM | POA: Diagnosis not present

## 2021-06-05 DIAGNOSIS — I482 Chronic atrial fibrillation, unspecified: Secondary | ICD-10-CM | POA: Diagnosis present

## 2021-06-05 DIAGNOSIS — R001 Bradycardia, unspecified: Secondary | ICD-10-CM | POA: Diagnosis present

## 2021-06-05 DIAGNOSIS — I5033 Acute on chronic diastolic (congestive) heart failure: Secondary | ICD-10-CM | POA: Diagnosis present

## 2021-06-05 DIAGNOSIS — I480 Paroxysmal atrial fibrillation: Secondary | ICD-10-CM | POA: Diagnosis not present

## 2021-06-05 DIAGNOSIS — I4891 Unspecified atrial fibrillation: Secondary | ICD-10-CM | POA: Diagnosis present

## 2021-06-05 DIAGNOSIS — Z7901 Long term (current) use of anticoagulants: Secondary | ICD-10-CM

## 2021-06-05 DIAGNOSIS — J9600 Acute respiratory failure, unspecified whether with hypoxia or hypercapnia: Secondary | ICD-10-CM

## 2021-06-05 DIAGNOSIS — Z20822 Contact with and (suspected) exposure to covid-19: Secondary | ICD-10-CM | POA: Diagnosis present

## 2021-06-05 DIAGNOSIS — E785 Hyperlipidemia, unspecified: Secondary | ICD-10-CM | POA: Diagnosis present

## 2021-06-05 DIAGNOSIS — Z978 Presence of other specified devices: Secondary | ICD-10-CM

## 2021-06-05 DIAGNOSIS — I2510 Atherosclerotic heart disease of native coronary artery without angina pectoris: Secondary | ICD-10-CM | POA: Diagnosis present

## 2021-06-05 DIAGNOSIS — M545 Low back pain, unspecified: Secondary | ICD-10-CM | POA: Diagnosis present

## 2021-06-05 DIAGNOSIS — M25562 Pain in left knee: Secondary | ICD-10-CM | POA: Diagnosis present

## 2021-06-05 DIAGNOSIS — Z9181 History of falling: Secondary | ICD-10-CM

## 2021-06-05 DIAGNOSIS — J188 Other pneumonia, unspecified organism: Secondary | ICD-10-CM | POA: Diagnosis present

## 2021-06-05 LAB — I-STAT ARTERIAL BLOOD GAS, ED
Acid-Base Excess: 0 mmol/L (ref 0.0–2.0)
Bicarbonate: 23.7 mmol/L (ref 20.0–28.0)
Calcium, Ion: 1.22 mmol/L (ref 1.15–1.40)
HCT: 35 % — ABNORMAL LOW (ref 36.0–46.0)
Hemoglobin: 11.9 g/dL — ABNORMAL LOW (ref 12.0–15.0)
O2 Saturation: 88 %
Patient temperature: 98.2
Potassium: 3.4 mmol/L — ABNORMAL LOW (ref 3.5–5.1)
Sodium: 138 mmol/L (ref 135–145)
TCO2: 25 mmol/L (ref 22–32)
pCO2 arterial: 33.4 mmHg (ref 32.0–48.0)
pH, Arterial: 7.457 — ABNORMAL HIGH (ref 7.350–7.450)
pO2, Arterial: 51 mmHg — ABNORMAL LOW (ref 83.0–108.0)

## 2021-06-05 LAB — COMPREHENSIVE METABOLIC PANEL
ALT: 37 U/L (ref 0–44)
AST: 33 U/L (ref 15–41)
Albumin: 3.3 g/dL — ABNORMAL LOW (ref 3.5–5.0)
Alkaline Phosphatase: 106 U/L (ref 38–126)
Anion gap: 9 (ref 5–15)
BUN: 24 mg/dL — ABNORMAL HIGH (ref 8–23)
CO2: 24 mmol/L (ref 22–32)
Calcium: 9.2 mg/dL (ref 8.9–10.3)
Chloride: 103 mmol/L (ref 98–111)
Creatinine, Ser: 0.77 mg/dL (ref 0.44–1.00)
GFR, Estimated: >60 mL/min (ref >60)
Glucose, Bld: 116 mg/dL — ABNORMAL HIGH (ref 70–99)
Potassium: 3.6 mmol/L (ref 3.5–5.1)
Sodium: 136 mmol/L (ref 135–145)
Total Bilirubin: 1.4 mg/dL — ABNORMAL HIGH (ref 0.3–1.2)
Total Protein: 6.4 g/dL — ABNORMAL LOW (ref 6.5–8.1)

## 2021-06-05 LAB — CBC WITH DIFFERENTIAL/PLATELET
Abs Immature Granulocytes: 0.07 10*3/uL (ref 0.00–0.07)
Basophils Absolute: 0 10*3/uL (ref 0.0–0.1)
Basophils Relative: 0 %
Eosinophils Absolute: 0 10*3/uL (ref 0.0–0.5)
Eosinophils Relative: 0 %
HCT: 39.5 % (ref 36.0–46.0)
Hemoglobin: 13.2 g/dL (ref 12.0–15.0)
Immature Granulocytes: 1 %
Lymphocytes Relative: 9 %
Lymphs Abs: 0.9 10*3/uL (ref 0.7–4.0)
MCH: 32.3 pg (ref 26.0–34.0)
MCHC: 33.4 g/dL (ref 30.0–36.0)
MCV: 96.6 fL (ref 80.0–100.0)
Monocytes Absolute: 0.8 10*3/uL (ref 0.1–1.0)
Monocytes Relative: 8 %
Neutro Abs: 8.7 10*3/uL — ABNORMAL HIGH (ref 1.7–7.7)
Neutrophils Relative %: 82 %
Platelets: 206 10*3/uL (ref 150–400)
RBC: 4.09 MIL/uL (ref 3.87–5.11)
RDW: 13.9 % (ref 11.5–15.5)
WBC: 10.6 10*3/uL — ABNORMAL HIGH (ref 4.0–10.5)
nRBC: 0 % (ref 0.0–0.2)

## 2021-06-05 LAB — RESP PANEL BY RT-PCR (FLU A&B, COVID) ARPGX2
Influenza A by PCR: NEGATIVE
Influenza B by PCR: NEGATIVE
SARS Coronavirus 2 by RT PCR: NEGATIVE

## 2021-06-05 LAB — TROPONIN I (HIGH SENSITIVITY)
Troponin I (High Sensitivity): 17 ng/L (ref <18)
Troponin I (High Sensitivity): 17 ng/L (ref <18)

## 2021-06-05 LAB — BRAIN NATRIURETIC PEPTIDE: B Natriuretic Peptide: 468.6 pg/mL — ABNORMAL HIGH (ref 0.0–100.0)

## 2021-06-05 LAB — LACTIC ACID, PLASMA
Lactic Acid, Venous: 1.4 mmol/L (ref 0.5–1.9)
Lactic Acid, Venous: 1.1 mmol/L (ref 0.5–1.9)

## 2021-06-05 LAB — PROTIME-INR
INR: 1.5 — ABNORMAL HIGH (ref 0.8–1.2)
Prothrombin Time: 17.9 seconds — ABNORMAL HIGH (ref 11.4–15.2)

## 2021-06-05 IMAGING — DX DG CHEST 1V PORT
1 series · 1 of 1 positions shown · non-contrast
Comparison: [DATE]

CLINICAL DATA: Shortness of breath.

EXAM:
PORTABLE CHEST 1 VIEW

[chest ap]
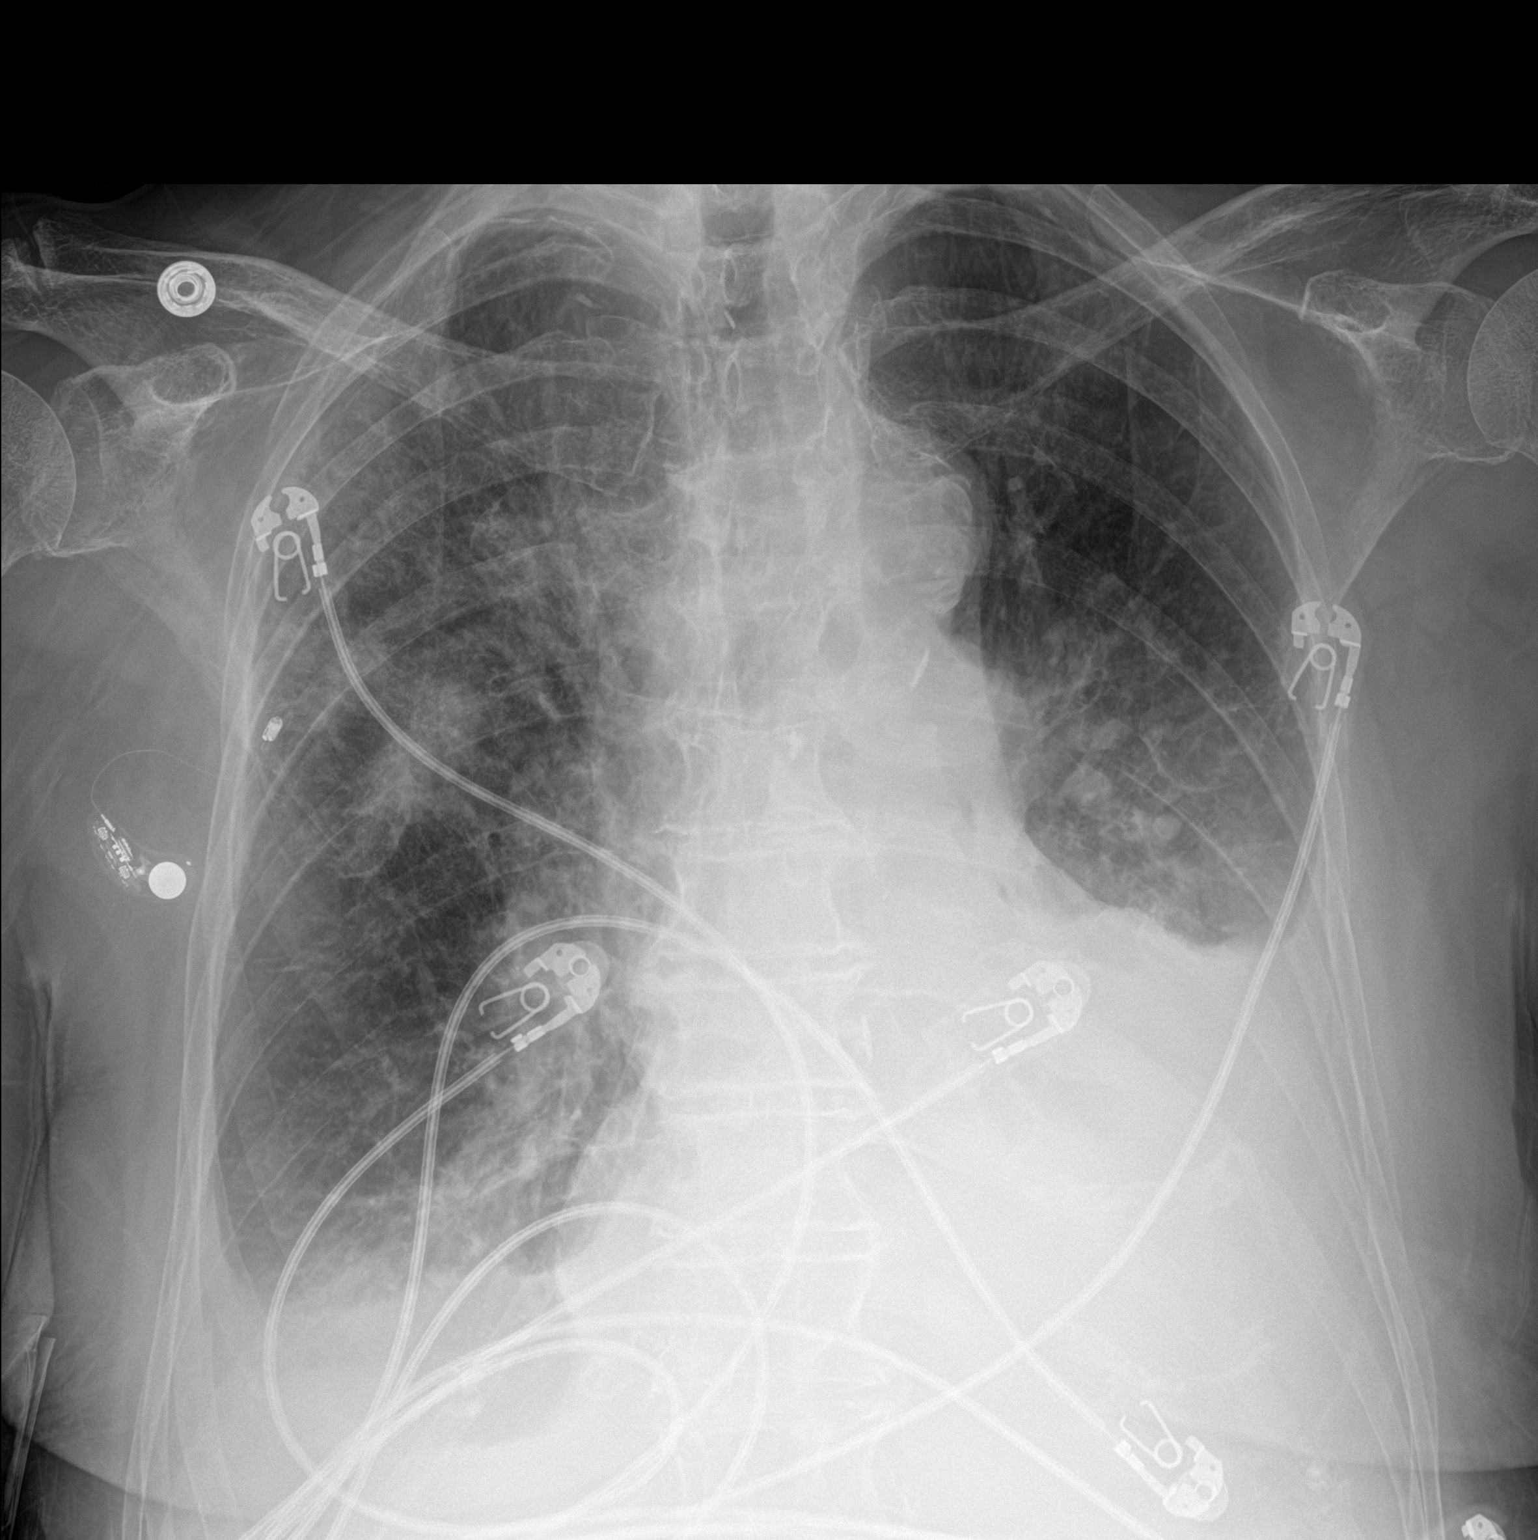

[1 of 1 positions shown; findings below may reference images not displayed]

FINDINGS: The lungs are hyperinflated. Mild patchy atelectasis and/or
infiltrate is seen within the mid right lung and right lung base.
There is a small right pleural effusion. A moderate size left
pleural effusion is also seen. No pneumothorax is identified. The
heart size and mediastinal contours are within normal limits. There
is mild calcification of the thoracic aorta multilevel degenerative
changes are seen throughout the thoracic spine.
IMPRESSION: 1. Mild patchy atelectasis and/or infiltrate within the mid right
lung and right lung base with suspected airspace disease within the
left lung base. Follow-up to resolution is recommended to exclude
the presence of an underlying neoplasm.
2. Small right pleural effusion and moderate size left pleural
effusion.

## 2021-06-05 MED ORDER — SENNOSIDES-DOCUSATE SODIUM 8.6-50 MG PO TABS: 1.0000 | ORAL_TABLET | Freq: Every evening | ORAL | Status: DC | PRN

## 2021-06-05 MED ORDER — SODIUM CHLORIDE 0.9 % IV SOLN
1.0000 g | INTRAVENOUS | Status: AC
  Administered 2021-06-06 – 2021-06-10 (×5): 1 g via INTRAVENOUS
  Filled 2021-06-05 (×4): qty 10

## 2021-06-05 MED ORDER — ACETAMINOPHEN 650 MG RE SUPP: 650.0000 mg | Freq: Four times a day (QID) | RECTAL | Status: DC | PRN

## 2021-06-05 MED ORDER — AMLODIPINE BESYLATE 5 MG PO TABS
5.0000 mg | ORAL_TABLET | Freq: Every day | ORAL | Status: DC
  Administered 2021-06-05 – 2021-06-06 (×2): 5 mg via ORAL
  Filled 2021-06-05 (×2): qty 1

## 2021-06-05 MED ORDER — LOSARTAN POTASSIUM 50 MG PO TABS
50.0000 mg | ORAL_TABLET | Freq: Every day | ORAL | Status: DC
  Filled 2021-06-05: qty 1

## 2021-06-05 MED ORDER — SODIUM CHLORIDE 0.9 % IV SOLN
1.0000 g | Freq: Once | INTRAVENOUS | Status: AC
  Administered 2021-06-05: 20:00:00 1 g via INTRAVENOUS
  Filled 2021-06-05: qty 10

## 2021-06-05 MED ORDER — METOPROLOL TARTRATE 25 MG PO TABS
50.0000 mg | ORAL_TABLET | Freq: Two times a day (BID) | ORAL | Status: DC
  Administered 2021-06-05 – 2021-06-06 (×2): 50 mg via ORAL
  Filled 2021-06-05 (×2): qty 2

## 2021-06-05 MED ORDER — ACETAMINOPHEN 325 MG PO TABS
650.0000 mg | ORAL_TABLET | Freq: Four times a day (QID) | ORAL | Status: DC | PRN
  Administered 2021-06-06 – 2021-06-11 (×5): 650 mg via ORAL
  Filled 2021-06-05 (×5): qty 2

## 2021-06-05 MED ORDER — SODIUM CHLORIDE 0.9 % IV SOLN
100.0000 mg | Freq: Two times a day (BID) | INTRAVENOUS | Status: DC
  Administered 2021-06-05 – 2021-06-06 (×3): 100 mg via INTRAVENOUS
  Filled 2021-06-05 (×4): qty 100

## 2021-06-05 MED ORDER — SODIUM CHLORIDE 0.9 % IV SOLN
500.0000 mg | Freq: Once | INTRAVENOUS | Status: DC
  Filled 2021-06-05: qty 500

## 2021-06-05 MED ORDER — ALPRAZOLAM 0.25 MG PO TABS: 0.5000 mg | ORAL_TABLET | Freq: Two times a day (BID) | ORAL | Status: DC | PRN

## 2021-06-05 MED ORDER — LACTATED RINGERS IV SOLN: INTRAVENOUS | Status: DC

## 2021-06-05 MED ORDER — APIXABAN 5 MG PO TABS
5.0000 mg | ORAL_TABLET | Freq: Two times a day (BID) | ORAL | Status: DC
  Administered 2021-06-05 – 2021-06-08 (×6): 5 mg via ORAL
  Filled 2021-06-05 (×6): qty 1

## 2021-06-05 MED ORDER — ATORVASTATIN CALCIUM 40 MG PO TABS
40.0000 mg | ORAL_TABLET | Freq: Every day | ORAL | Status: DC
  Administered 2021-06-05 – 2021-06-09 (×5): 40 mg via ORAL
  Filled 2021-06-05 (×6): qty 1

## 2021-06-05 MED ORDER — ALBUTEROL SULFATE (2.5 MG/3ML) 0.083% IN NEBU
2.5000 mg | INHALATION_SOLUTION | RESPIRATORY_TRACT | Status: DC | PRN
  Administered 2021-06-06: 02:00:00 2.5 mg via RESPIRATORY_TRACT
  Filled 2021-06-05: qty 3

## 2021-06-05 NOTE — ED Notes (Signed)
MD paged about pts low O2 saturations on 12L high flow [place].

## 2021-06-05 NOTE — H&P (Signed)
History and Physical    Brooke [name] DOB: 1935-03-27 DOA: 05/10/2021  PCP: Isaac Bliss, Rayford Halsted, MD   Brooke [name] coming from: Home  Chief Complaint: SOB, Cough, weakness of legs  HPI: Brooke [name] is a 86 y.o. female with medical history significant for a-fib, HTN, HLD, depression who presents by EMS with SOB and hypoxemia.  She reports that over a month ago she had a fall and hurt Brooke [name] lower back and has been followed by orthopedic surgery for this and is waiting an MRI which she reports for the last week she is having more weakness in Brooke [name] legs and feels like Brooke [name] legs will give out on Brooke [name] causing Brooke [name] to need to sit down frequently.  States she was walking in the house when she became very short of breath and had to sit down.  Brooke [name] husband helped Brooke [name] down to the ground.  This happened twice today.  She reports that she does not have any pain in Brooke [name] legs.  She has been having shortness of breath with a cough and states she felt warm the past few days.  EMS was called and found Brooke [name] to have an O2 sat of 64% on room air and she was placed on a nonrebreather.  She was converted to nasal cannula in the emergency room.  She denies having any chest pain or palpitations.  Reports she has been having a cough for the last 4 to 5 days with an occasional yellow phlegm production.  She denies having any abdominal pain nausea vomiting or diarrhea.  She has not had any urinary symptoms.  She has used Tylenol at home she felt warm.  She reports has been taking Brooke [name] medications as prescribed.  She reports she used to use oxygen but has not used in the last few months.  She denies tobacco alcohol or illicit drug use.  ED Course: Brooke [name] has a T-max of 101.3 degrees in the emergency room.  She has been hemodynamically stable in the emergency room.  She is found to have multifocal pneumonia on chest x-ray and is requiring oxygen with high flow nasal cannula at this time.  EKG shows prolonged QT interval of  522.  WBC 10,600 hemoglobin 13.2 hematocrit 39.5 platelets 206,000.  Lactic acid 1.4.  Troponin 17.  BNP 468.6 with no old values to compare to.  Sodium 136 potassium 3.6 chloride 103 bicarb 24 creatinine 0.77 BUN 24 glucose 116 alkaline phosphatase 106 AST 33 ALT 37 total bilirubin 1.4.  INR 1.5.  COVID-19 negative.  Influenza A and B are negative.  Hospitalist service been asked admit for further management  Review of Systems:  General: Reports subjective fever, chills. Denies weight loss, night sweats.  Denies dizziness.  Denies change in appetite HENT: Denies head trauma, headache, denies change in hearing, tinnitus.  Denies nasal congestion or bleeding.  Denies sore throat, sores in mouth.  Denies difficulty swallowing Eyes: Denies blurry vision, pain in eye, drainage.  Denies discoloration of eyes. Neck: Denies pain.  Denies swelling.  Denies pain with movement. Cardiovascular: Denies chest pain, palpitations. Reports chronic leg edema.  Denies orthopnea Respiratory: Reports shortness of breath, cough. Denies wheezing.  Denies sputum production Gastrointestinal: Denies abdominal pain, swelling.  Denies nausea, vomiting, diarrhea.  Denies melena.  Denies hematemesis. Musculoskeletal: Reports weakness in legs for past few weeks. Denies limitation of movement.  Denies deformity or swelling. Denies arthralgias or myalgias. Genitourinary: Denies pelvic pain.  Denies urinary frequency or hesitancy.  Denies dysuria.  Skin: Denies rash. Denies petechiae, purpura, ecchymosis. Neurological: Denies syncope. Denies seizure activity. Denies paresthesia. Denies slurred speech, drooping face.  Denies visual change. Psychiatric: Denies depression, anxiety. Denies hallucinations.  Past Medical History:  Diagnosis Date   Atrial fibrillation (HCC)    CAD (coronary artery disease)    Glaucoma    Hyperlipidemia    OA (osteoarthritis) of hip    Tachycardia     Past Surgical History:  Procedure Laterality  Date   ABLATION     LEFT HEART CATH AND CORONARY ANGIOGRAPHY N/A 08/19/2020   Procedure: LEFT HEART CATH AND CORONARY ANGIOGRAPHY;  Surgeon: Lyn Records, MD;  Location: [facility];  Service: Cardiovascular;  Laterality: N/A;    Social History  reports that she has never smoked. She has never used smokeless tobacco. She reports that she does not drink alcohol and does not use drugs.  Allergies  Allergen Reactions   Hydrocodone-Acetaminophen Anaphylaxis    Family History  Problem Relation Age of Onset   CVA Mother    CAD Father    Aortic stenosis Sister      Prior to Admission medications   Medication Sig Start Date End Date Taking? Authorizing Provider  acetaminophen (TYLENOL) 325 MG tablet Take 650 mg by mouth every 6 (six) hours as needed for mild pain.  05/08/14   [provider]  ALPRAZolam Prudy Feeler) 0.5 MG tablet TAKE 1 TABLET BY MOUTH  TWICE DAILY AS NEEDED FOR  ANXIETY 05/03/21   Philip Aspen, Limmie Patricia, MD  amiodarone (PACERONE) 200 MG tablet Take 1 tablet (200 mg total) by mouth daily. Pt needs to keep upcoming appt in Feb, 2023 to get a 72yr supply 05/05/21   Lanier Prude, MD  amLODipine (NORVASC) 5 MG tablet Take 1 tablet (5 mg total) by mouth daily. 12/09/20   Lanier Prude, MD  apixaban (ELIQUIS) 5 MG TABS tablet Take 1 tablet (5 mg total) by mouth 2 (two) times daily. 10/05/20   Lanier Prude, MD  aspirin 81 MG EC tablet Take 81 mg by mouth daily.  11/24/16   [provider]  atorvastatin (LIPITOR) 40 MG tablet Take 40 mg by mouth daily.  11/23/16   [provider]  baclofen (LIORESAL) 10 MG tablet Take 1 tablet (10 mg total) by mouth 3 (three) times daily as needed for muscle spasms. 04/20/21   Kathryne Hitch, MD  carboxymethylcellul-glycerin (OPTIVE) 0.5-0.9 % ophthalmic solution Place 1-2 drops into both eyes 3 (three) times daily as needed for dry eyes. Thera Tears    [provider]  Cholecalciferol  25 MCG (1000 UT) tablet Take 1,000 Units by mouth daily.     [provider]  dorzolamide-timolol (COSOPT) 22.3-6.8 MG/ML ophthalmic solution Place 1 drop into the right eye 2 (two) times daily. 08/26/20   [provider]  hydrocortisone cream 1 % Apply 1 application topically daily as needed for itching.    [provider]  latanoprost (XALATAN) 0.005 % ophthalmic solution INSTILL 1 DROP INTO BOTH  EYES AT BEDTIME 05/18/21   Philip Aspen, Limmie Patricia, MD  losartan (COZAAR) 50 MG tablet Take 1 tablet (50 mg total) by mouth daily. 10/22/20   Jodelle Red, MD  metoprolol tartrate (LOPRESSOR) 50 MG tablet Take 1 tablet (50 mg total) by mouth 2 (two) times daily. 12/09/20   Lanier Prude, MD  oxyCODONE-acetaminophen (PERCOCET/ROXICET) 5-325 MG tablet Take 1 tablet by mouth every 6 (six) hours as needed for severe pain.  04/17/21   Isla Pence, MD  OXYGEN Inhale 2 L into the lungs daily as needed (if she feels bad).    [provider]  predniSONE (DELTASONE) 50 MG tablet Take one tablet daily for 5 days. 04/20/21   Mcarthur Rossetti, MD  sertraline (ZOLOFT) 100 MG tablet Take 1 tablet (100 mg total) by mouth daily. 09/28/20   Erline Hau, MD    Physical Exam: Vitals:   05/13/2021 1759 05/28/2021 1823 05/28/2021 1851 05/21/2021 1935  BP:   (!) 143/62 (!) 145/61  Pulse: 63 64 63 63  Resp: 18 (!) 22 (!) 23 (!) 24  Temp:      TempSrc:      SpO2: 99% 91% 93% 96%  Weight:      Height:        Constitutional: NAD, calm, comfortable Vitals:   05/16/2021 1759 05/16/2021 1823 06/07/2021 1851 05/11/2021 1935  BP:   (!) 143/62 (!) 145/61  Pulse: 63 64 63 63  Resp: 18 (!) 22 (!) 23 (!) 24  Temp:      TempSrc:      SpO2: 99% 91% 93% 96%  Weight:      Height:       General: WDWN, Alert and oriented x3.  Eyes: EOMI, PERRL, conjunctivae normal.  Sclera nonicteric HENT:  [place]/AT, external ears normal.  Nares patent without epistasis.  Mucous membranes  are moist. Posterior pharynx clear of any exudate or lesions.  Neck: Soft, normal range of motion, supple, no masses, no thyromegaly. Trachea midline Respiratory: Equal breath sounds with rhonchi in right mid to lower lung and left base. Intermittent expiratory wheezing, no crackles. Normal respiratory effort. No accessory muscle use.  Cardiovascular: Regular rhythm with normal rate, no murmurs / rubs / gallops. Has mild lower extremity edema. 2+ pedal pulses. Abdomen: Soft, no tenderness, nondistended, no rebound or guarding.  No masses palpated. Bowel sounds normoactive Musculoskeletal: FROM. no cyanosis. No joint deformity upper and lower extremities. Normal muscle tone.  Skin: Warm, dry, intact no rashes, lesions, ulcers. No induration Neurologic: CN 2-12 grossly intact. Normal speech. Sensation intact to touch in all extremities, patella DTR +1 bilaterally. Strength 5/5 in all extremities.   Psychiatric: Normal judgment and insight. Normal mood.    Labs on Admission: I have personally reviewed following labs and imaging studies  CBC: Recent Labs  Lab 06/08/2021 1729  WBC 10.6*  NEUTROABS 8.7*  HGB 13.2  HCT 39.5  MCV 96.6  PLT 99991111    Basic Metabolic Panel: Recent Labs  Lab 05/26/2021 1729  NA 136  K 3.6  CL 103  CO2 24  GLUCOSE 116*  BUN 24*  CREATININE 0.77  CALCIUM 9.2    GFR: Estimated Creatinine Clearance: 49 mL/min (by C-G formula based on SCr of 0.77 mg/dL).  Liver Function Tests: Recent Labs  Lab 06/01/2021 1729  AST 33  ALT 37  ALKPHOS 106  BILITOT 1.4*  PROT 6.4*  ALBUMIN 3.3*    Urine analysis:    Component Value Date/Time   COLORURINE COLORLESS (A) 04/17/2021 2115   APPEARANCEUR CLEAR 04/17/2021 2115   LABSPEC <1.005 (L) 04/17/2021 2115   PHURINE 7.0 04/17/2021 2115   GLUCOSEU NEGATIVE 04/17/2021 2115   HGBUR NEGATIVE 04/17/2021 2115   BILIRUBINUR NEGATIVE 04/17/2021 2115   KETONESUR NEGATIVE 04/17/2021 2115   PROTEINUR NEGATIVE 04/17/2021  2115   NITRITE NEGATIVE 04/17/2021 2115   LEUKOCYTESUR TRACE (A) 04/17/2021 2115    Radiological Exams on Admission: DG Chest  Port 1 View  Result Date: 05/25/2021 CLINICAL DATA:  Shortness of breath. EXAM: PORTABLE CHEST 1 VIEW COMPARISON:  May 26, 2020 FINDINGS: The lungs are hyperinflated. Mild patchy atelectasis and/or infiltrate is seen within the mid right lung and right lung base. There is a small right pleural effusion. A moderate size left pleural effusion is also seen. No pneumothorax is identified. The heart size and mediastinal contours are within normal limits. There is mild calcification of the thoracic aorta multilevel degenerative changes are seen throughout the thoracic spine. IMPRESSION: 1. Mild patchy atelectasis and/or infiltrate within the mid right lung and right lung base with suspected airspace disease within the left lung base. Follow-up to resolution is recommended to exclude the presence of an underlying neoplasm. 2. Small right pleural effusion and moderate size left pleural effusion. Electronically Signed   By: Virgina Norfolk M.D.   On: 05/19/2021 19:27    EKG: Independently reviewed.  Normal sinus rhythm with nonspecific ST anterior T wave changes.  Prolonged QT.  QTc 522  Assessment/Plan Principal Problem:   Multifocal pneumonia Brooke [name] is admitted to Medical Telemetry floor.  Started on Rocephin and Doxycycline for antibiotic coverage.  Blood cultures were obtained in the emergency room and will be monitored. Incentive spirometer every 2 hours while awake Supplemental oxygen to keep O2 sat between 92 to 96% IV fluid hydration with LR at 50 ml/hr Albuterol nebulizer as needed for cough, shortness of breath, wheezing  Active Problems:   Essential hypertension Continue Cozaar, metoprolol, Norvasc.  Monitor blood pressure    Hypoxemia Supplemental oxygen as above.  Incentive spirometer every 2 hours RT to follow.    Atrial fibrillation  Chronic  atrial fibrillation.  Continue metoprolol.  Amiodarone held overnight with prolonged QT interval. Continue Eliquis Echocardiogram ordered with mild elevation of BNP evaluate wall motion, EF and valvular function.  Last echocardiogram was September [id number] showed diastolic dysfunction and EF of 60 to 65%    Prolonged QT interval Avoid medications which could further prolong QT interval    Weakness of both legs Been having weakness in Brooke [name] legs for last few weeks after a fall but injured Brooke [name] low back.  She has been followed by orthopedic surgery who is awaiting MRI of Brooke [name] low back per Brooke [name] Consult PT for evaluation morning  DVT prophylaxis: Is on Eliquis for anticoagulation chronically which is continued  Code Status:   Full Code  Family Communication:  Diagnosis and plan discussed with Brooke [name] and Brooke [name] husband who is at bedside.  Brooke [name] is answered.  They verbalized understanding and agree with plan further recommendations to follow as clinical indicated Disposition Plan:   Brooke [name] is from:  Home  Anticipated DC to:  Home  Anticipated DC date:  Anticipate 2 midnight or more stay   Admission status:  Inpatient  Yevonne Aline Marvon [place] MD [facility]  How to contact the [facility] Attending or Consulting provider [place] or covering provider during after hours [place], for this Brooke [name]?   Check the care team in [facility] and look for a) attending/consulting [facility] provider listed and b) the [facility] team listed Log into [email or web address] and use [place]'s universal password to access. If you do not have the password, please contact the hospital operator. Locate the [facility] provider you are looking for under [facility] and page to a number that you can be directly reached. If you still have difficulty reaching the provider, please page the [facility] (Director on Call) for the Hospitalists listed  on amion for assistance.  05/26/2021, 8:17 PM

## 2021-06-05 NOTE — ED Notes (Signed)
Patient provided with pillows and warm blankets for comfort

## 2021-06-05 NOTE — ED Provider Notes (Signed)
[facility] EMERGENCY DEPARTMENT Provider Note   CSN: [id number] Arrival date & time:        History Chief Complaint  Patient presents with   Respiratory Distress    Brooke [name] is a 86 y.o. female.  She is brought in by EMS from home.  Poor historian.  She said she has had some low back pain and difficulty ambulating secondary to a fall.  Has seen orthopedics for this and is awaiting an MRI.  She has been more short of breath recently although cannot really tell me when this started.  She used to use oxygen but has not needed it for months.  She had more difficulty ambulating and her husband has to help lift her.  She fell twice today.  Complaining of pain in her back and her ribs pain in bilateral knees.  EMS found her satting 64% on room air.  Placed on 12 L.  Patient denies any chest pain fevers or chills.  No abdominal pain.  The history is provided by the patient and the EMS personnel.  Shortness of Breath Severity:  Moderate Onset quality:  Unable to specify Timing:  Constant Progression:  Worsening Chronicity:  New Relieved by:  Nothing Worsened by:  Activity Ineffective treatments:  None tried Associated symptoms: no abdominal pain, no chest pain, no cough, no fever, no headaches, no rash, no sore throat, no syncope and no vomiting   Risk factors: prolonged immobilization   Weakness Severity:  Moderate Onset quality:  Gradual Duration:  2 weeks Timing:  Constant Progression:  Worsening Chronicity:  New Context: not urinary tract infection   Relieved by:  Nothing Worsened by:  Activity Ineffective treatments:  Rest Associated symptoms: difficulty walking, falls and shortness of breath   Associated symptoms: no abdominal pain, no chest pain, no cough, no dysuria, no fever, no foul-smelling urine, no headaches, no syncope and no vomiting       Past Medical History:  Diagnosis Date   Atrial fibrillation (HCC)    CAD (coronary artery disease)     Glaucoma    Hyperlipidemia    OA (osteoarthritis) of hip    Tachycardia     Patient Active Problem List   Diagnosis Date Noted   Chronic venous insufficiency 04/26/2021   History of TIA (transient ischemic attack) 04/19/2020   Essential hypertension 04/19/2020   CAD (coronary artery disease)    Atrial fibrillation (HCC)    Hyperlipidemia    Glaucoma    OA (osteoarthritis) of hip    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     Family History  Problem Relation Age of Onset   CVA Mother    CAD Father    Aortic stenosis Sister     Social History   Tobacco Use   Smoking status: Never   Smokeless tobacco: Never  Substance Use Topics   Alcohol use: Never   Drug use: Never    Home Medications Prior to Admission medications   Medication Sig Start Date End Date Taking? Authorizing Provider  acetaminophen (TYLENOL) 325 MG tablet Take 650 mg by mouth every 6 (six) hours  as needed for mild pain.  05/08/14   [provider]  ALPRAZolam Prudy Feeler) 0.5 MG tablet TAKE 1 TABLET BY MOUTH  TWICE DAILY AS NEEDED FOR  ANXIETY 05/03/21   Philip Aspen, Limmie Patricia, MD  amiodarone (PACERONE) 200 MG tablet Take 1 tablet (200 mg total) by mouth daily. Pt needs to keep upcoming appt in Feb, 2023 to get a 33yr supply 05/05/21   Lanier Prude, MD  amLODipine (NORVASC) 5 MG tablet Take 1 tablet (5 mg total) by mouth daily. 12/09/20   Lanier Prude, MD  apixaban (ELIQUIS) 5 MG TABS tablet Take 1 tablet (5 mg total) by mouth 2 (two) times daily. 10/05/20   Lanier Prude, MD  aspirin 81 MG EC tablet Take 81 mg by mouth daily.  11/24/16   [provider]   atorvastatin (LIPITOR) 40 MG tablet Take 40 mg by mouth daily.  11/23/16   [provider]  baclofen (LIORESAL) 10 MG tablet Take 1 tablet (10 mg total) by mouth 3 (three) times daily as needed for muscle spasms. 04/20/21   Kathryne Hitch, MD  carboxymethylcellul-glycerin (OPTIVE) 0.5-0.9 % ophthalmic solution Place 1-2 drops into both eyes 3 (three) times daily as needed for dry eyes. Thera Tears    [provider]  Cholecalciferol 25 MCG (1000 UT) tablet Take 1,000 Units by mouth daily.     [provider]  dorzolamide-timolol (COSOPT) 22.3-6.8 MG/ML ophthalmic solution Place 1 drop into the right eye 2 (two) times daily. 08/26/20   [provider]  hydrocortisone cream 1 % Apply 1 application topically daily as needed for itching.    [provider]  latanoprost (XALATAN) 0.005 % ophthalmic solution INSTILL 1 DROP INTO BOTH  EYES AT BEDTIME 05/18/21   Philip Aspen, Limmie Patricia, MD  losartan (COZAAR) 50 MG tablet Take 1 tablet (50 mg total) by mouth daily. 10/22/20   Jodelle Red, MD  metoprolol tartrate (LOPRESSOR) 50 MG tablet Take 1 tablet (50 mg total) by mouth 2 (two) times daily. 12/09/20   Lanier Prude, MD  oxyCODONE-acetaminophen (PERCOCET/ROXICET) 5-325 MG tablet Take 1 tablet by mouth every 6 (six) hours as needed for severe pain. 04/17/21   Jacalyn Lefevre, MD  OXYGEN Inhale 2 L into the lungs daily as needed (if she feels bad).    [provider]  predniSONE (DELTASONE) 50 MG tablet Take one tablet daily for 5 days. 04/20/21   Kathryne Hitch, MD  sertraline (ZOLOFT) 100 MG tablet Take 1 tablet (100 mg total) by mouth daily. 09/28/20   Philip Aspen, Limmie Patricia, MD    Allergies    Hydrocodone-acetaminophen  Review of Systems   Review of Systems  Constitutional:  Negative for fever.  HENT:  Negative for sore throat.   Respiratory:  Positive for shortness of breath. Negative for cough.    Cardiovascular:  Negative for chest pain and syncope.  Gastrointestinal:  Negative for abdominal pain and vomiting.  Genitourinary:  Negative for dysuria.  Musculoskeletal:  Positive for back pain, falls and gait problem.  Skin:  Negative for rash.  Neurological:  Positive for weakness. Negative for headaches.   Physical Exam Updated Vital Signs BP (!) 148/66 (BP Location: Right Arm)   Pulse 63   Temp 99.6 F (37.6 C) (Oral)   Resp 20   Ht 5\' 6"  (1.676 m)   Wt 64.4 kg   SpO2 94%   BMI 22.92 kg/m   Physical Exam Vitals and nursing note  reviewed.  Constitutional:      General: She is not in acute distress.    Appearance: Normal appearance. She is well-developed.  HENT:     Head: Normocephalic and atraumatic.  Eyes:     Conjunctiva/sclera: Conjunctivae normal.  Cardiovascular:     Rate and Rhythm: Normal rate and regular rhythm.     Heart sounds: No murmur heard. Pulmonary:     Effort: Pulmonary effort is normal. No respiratory distress.     Breath sounds: Rhonchi present.  Abdominal:     Palpations: Abdomen is soft.     Tenderness: There is no abdominal tenderness. There is no guarding or rebound.  Musculoskeletal:        General: Tenderness present. No swelling.     Cervical back: Neck supple.     Comments: She has tenderness of both her knees although there is no deformity or bruising.  Skin:    General: Skin is warm and dry.     Capillary Refill: Capillary refill takes less than 2 seconds.  Neurological:     Mental Status: She is alert.     Motor: Weakness present.     Comments: She has normal upper extremity strength.  She has 3 out of 5 left leg weakness and 5 out of 5 on right.  Psychiatric:        Mood and Affect: Mood normal.    ED Results / Procedures / Treatments   Labs (all labs ordered are listed, but only abnormal results are displayed) Labs Reviewed  COMPREHENSIVE METABOLIC PANEL - Abnormal; Notable for the following components:      Result  Value   Glucose, Bld 116 (*)    BUN 24 (*)    Total Protein 6.4 (*)    Albumin 3.3 (*)    Total Bilirubin 1.4 (*)    All other components within normal limits  CBC WITH DIFFERENTIAL/PLATELET - Abnormal; Notable for the following components:   WBC 10.6 (*)    Neutro Abs 8.7 (*)    All other components within normal limits  PROTIME-INR - Abnormal; Notable for the following components:   Prothrombin Time 17.9 (*)    INR 1.5 (*)    All other components within normal limits  URINALYSIS, ROUTINE W REFLEX MICROSCOPIC - Abnormal; Notable for the following components:   APPearance HAZY (*)    Ketones, ur 5 (*)    Protein, ur 30 (*)    Leukocytes,Ua TRACE (*)    Bacteria, UA RARE (*)    All other components within normal limits  BRAIN NATRIURETIC PEPTIDE - Abnormal; Notable for the following components:   B Natriuretic Peptide 468.6 (*)    All other components within normal limits  BASIC METABOLIC PANEL - Abnormal; Notable for the following components:   Potassium 3.3 (*)    Glucose, Bld 122 (*)    Calcium 8.8 (*)    All other components within normal limits  CBC - Abnormal; Notable for the following components:   WBC 12.9 (*)    All other components within normal limits  I-STAT ARTERIAL BLOOD GAS, ED - Abnormal; Notable for the following components:   pH, Arterial 7.457 (*)    pO2, Arterial 51 (*)    Potassium 3.4 (*)    HCT 35.0 (*)    Hemoglobin 11.9 (*)    All other components within normal limits  CULTURE, BLOOD (ROUTINE X 2)  CULTURE, BLOOD (ROUTINE X 2)  RESP PANEL BY RT-PCR (FLU A&B, COVID)  ARPGX2  LACTIC ACID, PLASMA  LACTIC ACID, PLASMA  BLOOD GAS, ARTERIAL  TROPONIN I (HIGH SENSITIVITY)  TROPONIN I (HIGH SENSITIVITY)    EKG EKG Interpretation  Date/Time:  Sunday 2021/07/04 17:20:47 EST Ventricular Rate:  63 PR Interval:  199 QRS Duration: 90 QT Interval:  509 QTC Calculation: 522 R Axis:   89 Text Interpretation: Sinus rhythm Borderline right axis  deviation Borderline T abnormalities, anterior leads Prolonged QT interval No significant change since 11/21 Confirmed by Meridee Score [phone]) on 07-04-21 5:24:49 PM  Radiology DG Chest Port 1 View  Result Date: 2021/07/04 CLINICAL DATA:  Shortness of breath. EXAM: PORTABLE CHEST 1 VIEW COMPARISON:  May 26, 2020 FINDINGS: The lungs are hyperinflated. Mild patchy atelectasis and/or infiltrate is seen within the mid right lung and right lung base. There is a small right pleural effusion. A moderate size left pleural effusion is also seen. No pneumothorax is identified. The heart size and mediastinal contours are within normal limits. There is mild calcification of the thoracic aorta multilevel degenerative changes are seen throughout the thoracic spine. IMPRESSION: 1. Mild patchy atelectasis and/or infiltrate within the mid right lung and right lung base with suspected airspace disease within the left lung base. Follow-up to resolution is recommended to exclude the presence of an underlying neoplasm. 2. Small right pleural effusion and moderate size left pleural effusion. Electronically Signed   By: Aram Candela M.D.   On: 2021-07-04 19:27    Procedures .Critical Care Performed by: Terrilee Files, MD Authorized by: Terrilee Files, MD   Critical care provider statement:    Critical care time (minutes):  45   Critical care time was exclusive of:  Separately billable procedures and treating other patients   Critical care was necessary to treat or prevent imminent or life-threatening deterioration of the following conditions:  Respiratory failure   Critical care was time spent personally by me on the following activities:  Development of treatment plan with patient or surrogate, discussions with consultants, evaluation of patient's response to treatment, examination of patient, obtaining history from patient or surrogate, ordering and performing treatments and interventions, ordering  and review of laboratory studies, ordering and review of radiographic studies, pulse oximetry and re-evaluation of patient's condition   I assumed direction of critical care for this patient from another provider in my specialty: no     Medications Ordered in ED Medications  amLODipine (NORVASC) tablet 5 mg (5 mg Oral Given 06/06/21 0923)  atorvastatin (LIPITOR) tablet 40 mg (40 mg Oral Given 06/06/21 0923)  metoprolol tartrate (LOPRESSOR) tablet 50 mg (50 mg Oral Given 06/06/21 0922)  ALPRAZolam Prudy Feeler) tablet 0.5 mg (has no administration in time range)  apixaban (ELIQUIS) tablet 5 mg (5 mg Oral Given 06/06/21 0922)  cefTRIAXone (ROCEPHIN) 1 g in sodium chloride 0.9 % 100 mL IVPB (has no administration in time range)  doxycycline (VIBRAMYCIN) 100 mg in sodium chloride 0.9 % 250 mL IVPB (100 mg Intravenous New Bag/Given 06/06/21 0930)  acetaminophen (TYLENOL) tablet 650 mg (has no administration in time range)    Or  acetaminophen (TYLENOL) suppository 650 mg (has no administration in time range)  senna-docusate (Senokot-S) tablet 1 tablet (has no administration in time range)  albuterol (PROVENTIL) (2.5 MG/3ML) 0.083% nebulizer solution 2.5 mg (2.5 mg Nebulization Given 06/06/21 0134)  ipratropium-albuterol (DUONEB) 0.5-2.5 (3) MG/3ML nebulizer solution 3 mL (3 mLs Nebulization Given 06/06/21 0930)  cefTRIAXone (ROCEPHIN) 1 g in sodium chloride 0.9 % 100 mL IVPB (0  g Intravenous Stopped 2021-07-05 2156)  potassium chloride SA (KLOR-CON) CR tablet 40 mEq (40 mEq Oral Given 06/06/21 0924)  furosemide (LASIX) injection 40 mg (40 mg Intravenous Given 06/06/21 1610)    ED Course  I have reviewed the triage vital signs and the nursing notes.  Pertinent labs & imaging results that were available during my care of the patient were reviewed by me and considered in my medical decision making (see chart for details).  Clinical Course as of 06/06/21 1028  Sun Jul 05, 2021  [id number] Chest x-ray  interpreted by me as probable left effusion cannot exclude infiltrate, question right upper lobe infiltrate.  Awaiting radiology reading. [MB]  1952 Discussed with [facility] hospitalist Dr. Rachael Darby who will evaluate for admission. [MB]    Clinical Course User Index [MB] Terrilee Files, MD   MDM Rules/Calculators/A&P                          Avyana Puffenbarger was evaluated in Emergency Department on 2021-07-05 for the symptoms described in the history of present illness. She was evaluated in the context of the global COVID-19 pandemic, which necessitated consideration that the patient might be at risk for infection with the SARS-CoV-2 virus that causes COVID-19. Institutional protocols and algorithms that pertain to the evaluation of patients at risk for COVID-19 are in a state of rapid change based on information released by regulatory bodies including the CDC and federal and state organizations. These policies and algorithms were followed during the patient's care in the ED.  This patient complains of generalized weakness back pain leg weakness shortness of breath; this involves an extensive number of treatment Options and is a complaint that carries with it a high risk of complications and Morbidity. The differential includes COVID, flu, pneumonia, dehydration, radiculopathy, anemia, metabolic derangement, PE  I ordered, reviewed and interpreted labs, which included CBC with mildly elevated white count, stable hemoglobin, chemistries with mildly low potassium elevated glucose, blood culture sent, lactate not elevated, troponins flat, BNP mildly elevated, urinalysis without clear signs of infection, COVID and flu negative I ordered medication IV antibiotics oxygen therapy I ordered imaging studies which included chest x-ray and I independently    visualized and interpreted imaging which showed multifocal pneumonia Additional history obtained from EMS and patient's husband Previous records obtained and  reviewed in epic including prior orthopedic visits I consulted [facility] hospitalist Dr. Rachael Darby And discussed lab and imaging findings  Critical Interventions: Initially patient's hypoxia with high flow oxygen and antibiotics  After the interventions stated above, I reevaluated the patient and found patient to be satting reasonably well.  She still frail-appearing and will need admission to the hospital for further management.  Patient agreeable to plan  CHA2DS2/VAS Stroke Risk Points  Current as of 4 minutes ago     5 >= 2 Points: High Risk  1 - 1.99 Points: Medium Risk  0 Points: Low Risk    Last Change: N/A      Details    This score determines the patient's risk of having a stroke if the  patient has atrial fibrillation.       Points Metrics  0 Has Congestive Heart Failure:  No    Current as of 4 minutes ago  1 Has Vascular Disease:  Yes    Current as of 4 minutes ago  1 Has Hypertension:  Yes    Current as of 4 minutes ago  2 Age:  86    Current as of 4 minutes ago  0 Has Diabetes:  No    Current as of 4 minutes ago  0 Had Stroke:  No  Had TIA:  No  Had Thromboembolism:  No    Current as of 4 minutes ago  1 Female:  Yes    Current as of 4 minutes ago           Final Clinical Impression(s) / ED Diagnoses Final diagnoses:  Acute respiratory failure with hypoxia (HCC)  Multifocal pneumonia  Chronic atrial fibrillation [facility])    Rx / DC Orders ED Discharge Orders     None        Terrilee Files, MD 06/06/21 [phone]

## 2021-06-05 NOTE — ED Triage Notes (Signed)
Pt bib ems originally for back pain however when they arrived pt was sating at 64% on room air, unable to maintain 02 sats above 90% until a simple mask at 12L was provided, pt reports more then 3 falls in the last month and 2 only yesterday, she has bilateral leg swelling and hx of afib

## 2021-06-05 NOTE — ED Triage Notes (Signed)
Pt takes 2 eliquis a day, pt reports falling today twice, did not hit head

## 2021-06-06 ENCOUNTER — Inpatient Hospital Stay (HOSPITAL_COMMUNITY): Payer: Medicare Other

## 2021-06-06 DIAGNOSIS — J9601 Acute respiratory failure with hypoxia: Secondary | ICD-10-CM

## 2021-06-06 DIAGNOSIS — R9431 Abnormal electrocardiogram [ECG] [EKG]: Secondary | ICD-10-CM

## 2021-06-06 DIAGNOSIS — J189 Pneumonia, unspecified organism: Secondary | ICD-10-CM | POA: Diagnosis not present

## 2021-06-06 DIAGNOSIS — I482 Chronic atrial fibrillation, unspecified: Secondary | ICD-10-CM

## 2021-06-06 DIAGNOSIS — I10 Essential (primary) hypertension: Secondary | ICD-10-CM

## 2021-06-06 DIAGNOSIS — R0609 Other forms of dyspnea: Secondary | ICD-10-CM

## 2021-06-06 DIAGNOSIS — I480 Paroxysmal atrial fibrillation: Secondary | ICD-10-CM

## 2021-06-06 DIAGNOSIS — R29898 Other symptoms and signs involving the musculoskeletal system: Secondary | ICD-10-CM

## 2021-06-06 LAB — I-STAT ARTERIAL BLOOD GAS, ED
Acid-Base Excess: 2 mmol/L (ref 0.0–2.0)
Bicarbonate: 27.5 mmol/L (ref 20.0–28.0)
Calcium, Ion: 1.23 mmol/L (ref 1.15–1.40)
HCT: 37 % (ref 36.0–46.0)
Hemoglobin: 12.6 g/dL (ref 12.0–15.0)
O2 Saturation: 96 %
Potassium: 3.1 mmol/L — ABNORMAL LOW (ref 3.5–5.1)
Sodium: 141 mmol/L (ref 135–145)
TCO2: 29 mmol/L (ref 22–32)
pCO2 arterial: 45.5 mmHg (ref 32.0–48.0)
pH, Arterial: 7.39 (ref 7.350–7.450)
pO2, Arterial: 82 mmHg — ABNORMAL LOW (ref 83.0–108.0)

## 2021-06-06 LAB — URINALYSIS, ROUTINE W REFLEX MICROSCOPIC
Bilirubin Urine: NEGATIVE
Glucose, UA: NEGATIVE mg/dL
Hgb urine dipstick: NEGATIVE
Ketones, ur: 5 mg/dL — AB
Nitrite: NEGATIVE
Protein, ur: 30 mg/dL — AB
Specific Gravity, Urine: 1.021 (ref 1.005–1.030)
pH: 5 (ref 5.0–8.0)

## 2021-06-06 LAB — CBC
HCT: 37.7 % (ref 36.0–46.0)
Hemoglobin: 12.6 g/dL (ref 12.0–15.0)
MCH: 32.4 pg (ref 26.0–34.0)
MCHC: 33.4 g/dL (ref 30.0–36.0)
MCV: 96.9 fL (ref 80.0–100.0)
Platelets: 210 10*3/uL (ref 150–400)
RBC: 3.89 MIL/uL (ref 3.87–5.11)
RDW: 14.1 % (ref 11.5–15.5)
WBC: 12.9 10*3/uL — ABNORMAL HIGH (ref 4.0–10.5)
nRBC: 0 % (ref 0.0–0.2)

## 2021-06-06 LAB — BASIC METABOLIC PANEL
Anion gap: 9 (ref 5–15)
Anion gap: 8 (ref 5–15)
BUN: 16 mg/dL (ref 8–23)
BUN: 17 mg/dL (ref 8–23)
CO2: 23 mmol/L (ref 22–32)
CO2: 26 mmol/L (ref 22–32)
Calcium: 8.8 mg/dL — ABNORMAL LOW (ref 8.9–10.3)
Calcium: 8.3 mg/dL — ABNORMAL LOW (ref 8.9–10.3)
Chloride: 106 mmol/L (ref 98–111)
Chloride: 107 mmol/L (ref 98–111)
Creatinine, Ser: 0.53 mg/dL (ref 0.44–1.00)
Creatinine, Ser: 0.66 mg/dL (ref 0.44–1.00)
GFR, Estimated: >60 mL/min (ref >60)
GFR, Estimated: >60 mL/min (ref >60)
Glucose, Bld: 122 mg/dL — ABNORMAL HIGH (ref 70–99)
Glucose, Bld: 165 mg/dL — ABNORMAL HIGH (ref 70–99)
Potassium: 3.3 mmol/L — ABNORMAL LOW (ref 3.5–5.1)
Potassium: 3.3 mmol/L — ABNORMAL LOW (ref 3.5–5.1)
Sodium: 138 mmol/L (ref 135–145)
Sodium: 141 mmol/L (ref 135–145)

## 2021-06-06 LAB — ECHOCARDIOGRAM COMPLETE
Area-P 1/2: 4.7 cm2
Height: 66 in
S' Lateral: 2.1 cm
Weight: 2272 oz

## 2021-06-06 LAB — GLUCOSE, CAPILLARY: Glucose-Capillary: 165 mg/dL — ABNORMAL HIGH (ref 70–99)

## 2021-06-06 LAB — MRSA NEXT GEN BY PCR, NASAL: MRSA by PCR Next Gen: NOT DETECTED

## 2021-06-06 LAB — STREP PNEUMONIAE URINARY ANTIGEN: Strep Pneumo Urinary Antigen: NEGATIVE

## 2021-06-06 LAB — MAGNESIUM: Magnesium: 2.1 mg/dL (ref 1.7–2.4)

## 2021-06-06 IMAGING — DX DG CHEST 1V PORT
1 series · 1 of 1 positions shown · non-contrast
Comparison: Chest x-ray [DATE].

CLINICAL DATA: Endotracheal tube.

EXAM:
PORTABLE CHEST 1 VIEW

[chest]
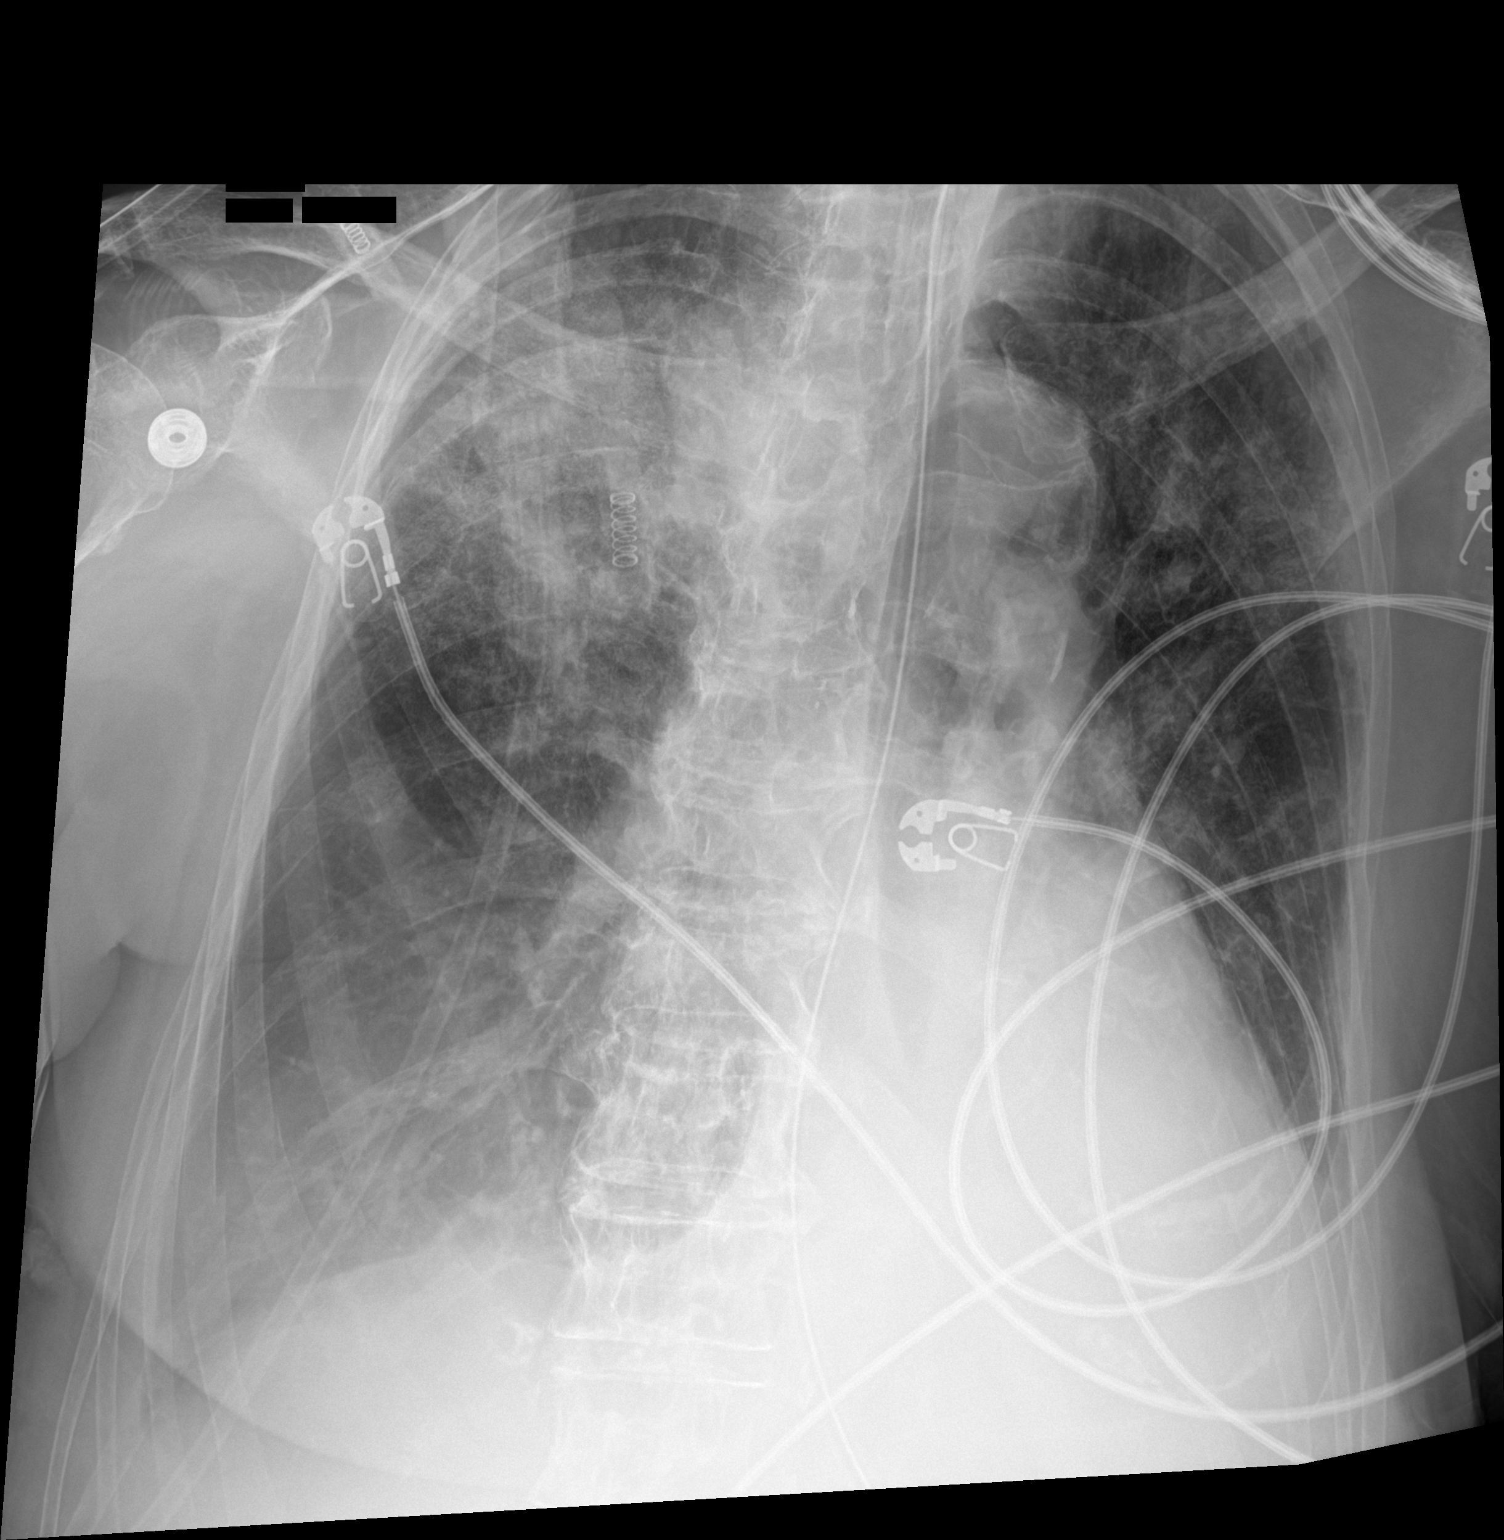

[1 of 1 positions shown; findings below may reference images not displayed]

FINDINGS: Endotracheal tube tip is 6.5 cm above the carina. Enteric tube
extends below the diaphragm.

Bilateral multifocal airspace disease is present, decreasing in the
left upper lobe. Small pleural effusions persist. The heart is
enlarged, unchanged. There is no pneumothorax. The cardiomediastinal
silhouette is stable, the heart is enlarged. Osseous structures are
stable.
IMPRESSION: 1. Endotracheal tube tip 6.5 cm above carina.
2. Left upper lobe airspace disease is decreasing. Otherwise stable
bilateral airspace disease.
3. Stable small pleural effusions.

## 2021-06-06 IMAGING — DX DG CHEST 1V PORT
1 series · 1 of 1 positions shown · non-contrast
Comparison: Chest x-ray [DATE].

CLINICAL DATA: Pneumonia, acute respiratory failure.

EXAM:
PORTABLE CHEST 1 VIEW

[chest ap]
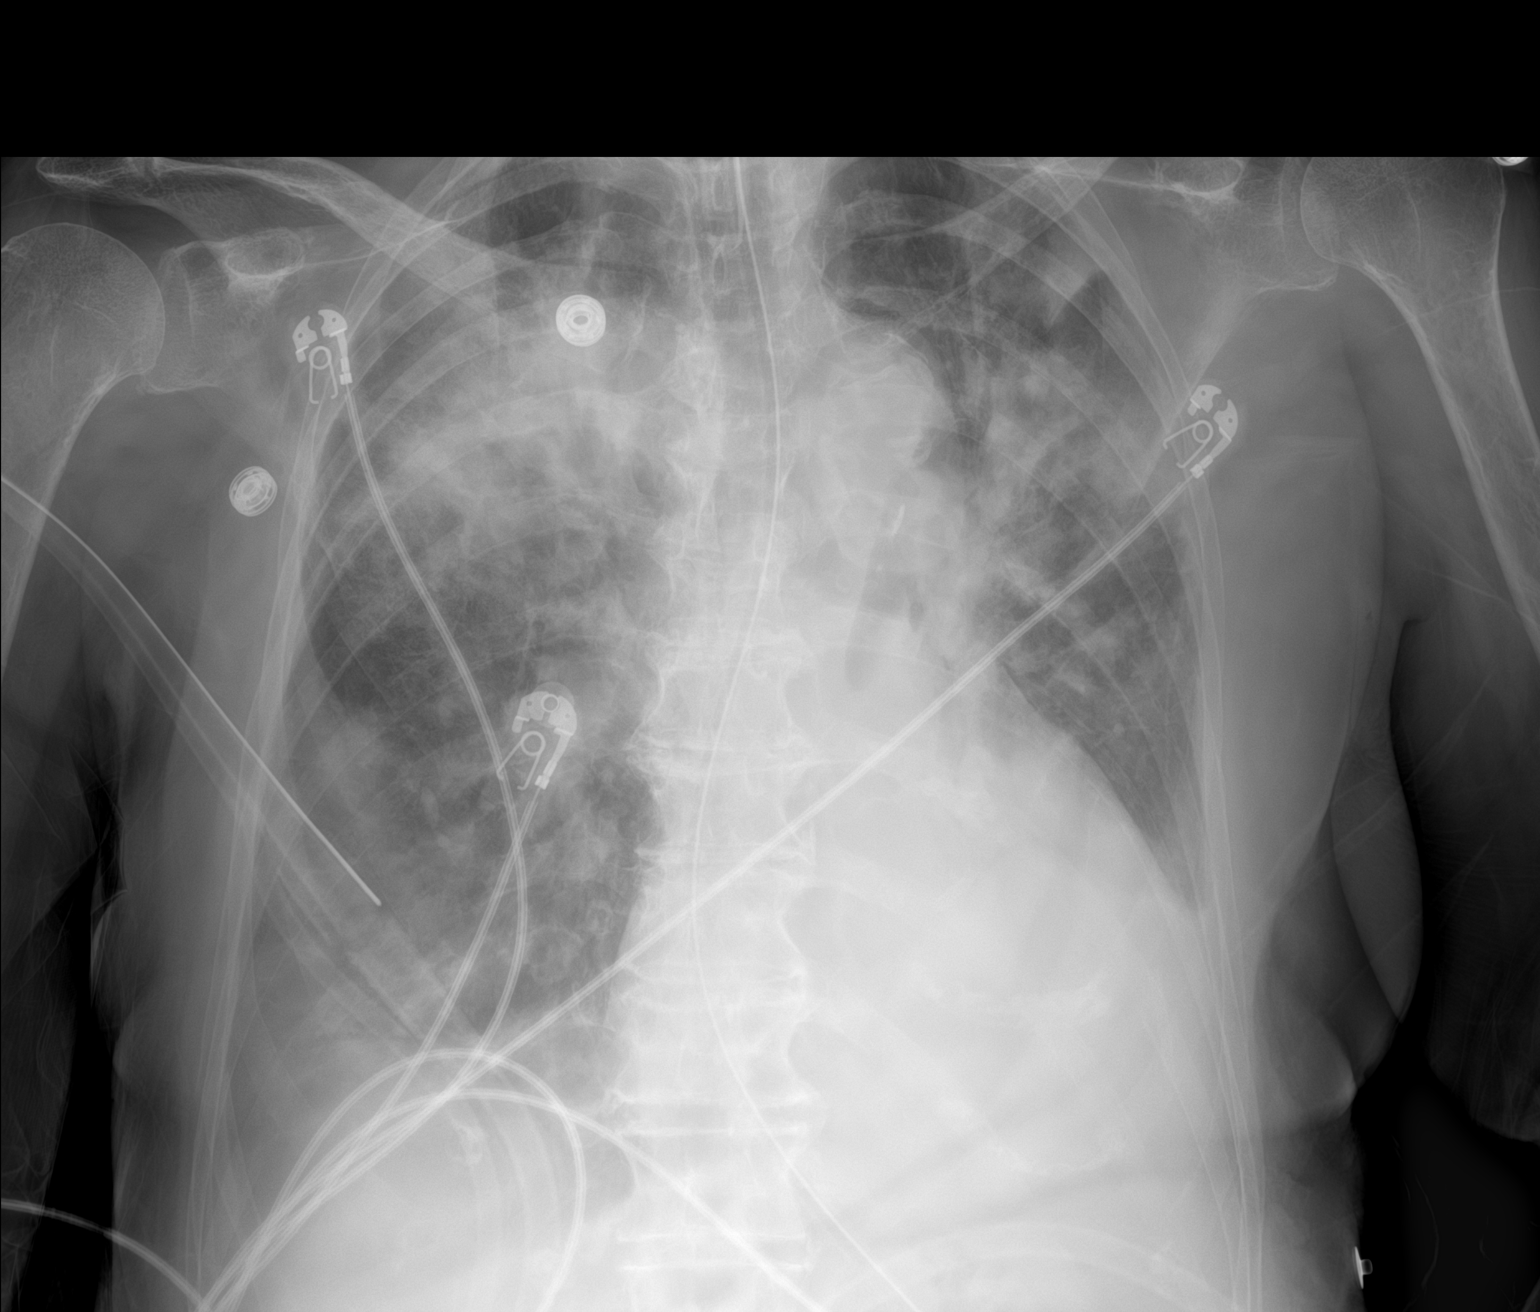

[1 of 1 positions shown; findings below may reference images not displayed]

FINDINGS: There is a new endotracheal tube with distal tip 7 cm above the
carina. Enteric tube extends below the diaphragm. Distal tip is not
included on the image.

The heart is enlarged, unchanged. There are increasing bilateral
multifocal airspace opacities, particularly in the upper lobes,
right greater than left. Small pleural effusions are stable, left
greater than right. There is no pneumothorax or acute fracture.
IMPRESSION: 1. Endotracheal tube tip is 7 cm above the carina. Consider
repositioning. Enteric tube extends below the diaphragm.
2. Increasing bilateral multifocal airspace disease.
3. Stable small pleural effusions.

## 2021-06-06 MED ORDER — DOCUSATE SODIUM 100 MG PO CAPS: 100.0000 mg | ORAL_CAPSULE | Freq: Two times a day (BID) | ORAL | Status: DC | PRN

## 2021-06-06 MED ORDER — MIDAZOLAM HCL 5 MG/5ML IJ SOLN
INTRAMUSCULAR | Status: DC | PRN
  Administered 2021-06-06: 1 mg via INTRAVENOUS

## 2021-06-06 MED ORDER — CHLORHEXIDINE GLUCONATE CLOTH 2 % EX PADS
6.0000 | MEDICATED_PAD | Freq: Every day | CUTANEOUS | Status: DC
  Administered 2021-06-06 – 2021-06-13 (×8): 6 via TOPICAL

## 2021-06-06 MED ORDER — PAROXETINE HCL 20 MG PO TABS
20.0000 mg | ORAL_TABLET | Freq: Every day | ORAL | Status: DC
Start: 2021-06-06 — End: 2021-06-08
  Administered 2021-06-06 – 2021-06-08 (×3): 20 mg
  Filled 2021-06-06 (×4): qty 1

## 2021-06-06 MED ORDER — CHLORHEXIDINE GLUCONATE 0.12% ORAL RINSE (MEDLINE KIT)
15.0000 mL | Freq: Two times a day (BID) | OROMUCOSAL | Status: DC
  Administered 2021-06-06 – 2021-06-13 (×14): 15 mL via OROMUCOSAL

## 2021-06-06 MED ORDER — ORAL CARE MOUTH RINSE
15.0000 mL | OROMUCOSAL | Status: DC
  Administered 2021-06-06 – 2021-06-13 (×65): 15 mL via OROMUCOSAL

## 2021-06-06 MED ORDER — PROPOFOL 1000 MG/100ML IV EMUL
5.0000 ug/kg/min | INTRAVENOUS | Status: DC
  Administered 2021-06-06: 18:00:00 10 ug/kg/min via INTRAVENOUS
  Administered 2021-06-07: 5 ug/kg/min via INTRAVENOUS
  Administered 2021-06-07: 15 ug/kg/min via INTRAVENOUS
  Filled 2021-06-06: qty 200
  Filled 2021-06-06: qty 100

## 2021-06-06 MED ORDER — DORZOLAMIDE HCL-TIMOLOL MAL 2-0.5 % OP SOLN
1.0000 [drp] | Freq: Two times a day (BID) | OPHTHALMIC | Status: DC
  Administered 2021-06-06 – 2021-06-13 (×14): 1 [drp] via OPHTHALMIC
  Filled 2021-06-06: qty 10

## 2021-06-06 MED ORDER — POLYETHYLENE GLYCOL 3350 17 G PO PACK: 17.0000 g | PACK | Freq: Every day | ORAL | Status: DC | PRN

## 2021-06-06 MED ORDER — DOCUSATE SODIUM 50 MG/5ML PO LIQD: 100.0000 mg | Freq: Two times a day (BID) | ORAL | Status: DC | PRN

## 2021-06-06 MED ORDER — ROCURONIUM BROMIDE 50 MG/5ML IV SOLN
INTRAVENOUS | Status: DC | PRN
  Administered 2021-06-06: 60 mg via INTRAVENOUS

## 2021-06-06 MED ORDER — FENTANYL BOLUS VIA INFUSION
50.0000 ug | INTRAVENOUS | Status: DC | PRN
  Administered 2021-06-07 – 2021-06-08 (×3): 50 ug via INTRAVENOUS
  Filled 2021-06-06: qty 50

## 2021-06-06 MED ORDER — NOREPINEPHRINE 4 MG/250ML-% IV SOLN
2.0000 ug/min | INTRAVENOUS | Status: DC
  Administered 2021-06-07: 4 ug/min via INTRAVENOUS
  Administered 2021-06-08: 2 ug/min via INTRAVENOUS
  Administered 2021-06-10: 4 ug/min via INTRAVENOUS
  Administered 2021-06-10: 2 ug/min via INTRAVENOUS
  Administered 2021-06-11: 6 ug/min via INTRAVENOUS
  Administered 2021-06-11: 4 ug/min via INTRAVENOUS
  Administered 2021-06-12 (×2): 5 ug/min via INTRAVENOUS
  Administered 2021-06-13: 7 ug/min via INTRAVENOUS
  Filled 2021-06-06 (×8): qty 250

## 2021-06-06 MED ORDER — INSULIN ASPART 100 UNIT/ML IJ SOLN
0.0000 [IU] | INTRAMUSCULAR | Status: DC
  Administered 2021-06-06 (×2): 2 [IU] via SUBCUTANEOUS
  Administered 2021-06-07: 1 [IU] via SUBCUTANEOUS

## 2021-06-06 MED ORDER — MIDAZOLAM HCL 2 MG/2ML IJ SOLN
INTRAMUSCULAR | Status: AC
  Filled 2021-06-06: qty 2

## 2021-06-06 MED ORDER — ASPIRIN 81 MG PO CHEW
81.0000 mg | CHEWABLE_TABLET | Freq: Every day | ORAL | Status: DC
Start: 2021-06-06 — End: 2021-06-08
  Administered 2021-06-06 – 2021-06-08 (×3): 81 mg
  Filled 2021-06-06 (×3): qty 1

## 2021-06-06 MED ORDER — FUROSEMIDE 10 MG/ML IJ SOLN
40.0000 mg | Freq: Once | INTRAMUSCULAR | Status: AC
Start: 2021-06-06 — End: 2021-06-06
  Administered 2021-06-06: 22:00:00 40 mg via INTRAVENOUS
  Filled 2021-06-06: qty 4

## 2021-06-06 MED ORDER — IPRATROPIUM-ALBUTEROL 0.5-2.5 (3) MG/3ML IN SOLN
3.0000 mL | Freq: Four times a day (QID) | RESPIRATORY_TRACT | Status: DC
  Administered 2021-06-06 – 2021-06-08 (×7): 3 mL via RESPIRATORY_TRACT
  Filled 2021-06-06 (×8): qty 3

## 2021-06-06 MED ORDER — NOREPINEPHRINE 4 MG/250ML-% IV SOLN
INTRAVENOUS | Status: AC
  Administered 2021-06-06: 21:00:00 2 ug/min via INTRAVENOUS
  Filled 2021-06-06: qty 250

## 2021-06-06 MED ORDER — ETOMIDATE 2 MG/ML IV SOLN
INTRAVENOUS | Status: DC | PRN
  Administered 2021-06-06: 20 mg via INTRAVENOUS

## 2021-06-06 MED ORDER — FAMOTIDINE 20 MG PO TABS: 20.0000 mg | ORAL_TABLET | Freq: Every day | ORAL | Status: DC

## 2021-06-06 MED ORDER — POTASSIUM CHLORIDE CRYS ER 20 MEQ PO TBCR
40.0000 meq | EXTENDED_RELEASE_TABLET | Freq: Once | ORAL | Status: AC
  Administered 2021-06-06: 09:00:00 40 meq via ORAL
  Filled 2021-06-06: qty 2

## 2021-06-06 MED ORDER — FUROSEMIDE 10 MG/ML IJ SOLN
40.0000 mg | Freq: Once | INTRAMUSCULAR | Status: AC
  Administered 2021-06-06: 09:00:00 40 mg via INTRAVENOUS
  Filled 2021-06-06: qty 4

## 2021-06-06 MED ORDER — FENTANYL CITRATE (PF) 100 MCG/2ML IJ SOLN
INTRAMUSCULAR | Status: DC | PRN
  Administered 2021-06-06: 50 ug via INTRAVENOUS

## 2021-06-06 MED ORDER — LATANOPROST 0.005 % OP SOLN
1.0000 [drp] | Freq: Every day | OPHTHALMIC | Status: DC
Start: 2021-06-06 — End: 2021-06-13
  Administered 2021-06-06 – 2021-06-12 (×7): 1 [drp] via OPHTHALMIC
  Filled 2021-06-06: qty 2.5

## 2021-06-06 MED ORDER — POTASSIUM CHLORIDE CRYS ER 20 MEQ PO TBCR
40.0000 meq | EXTENDED_RELEASE_TABLET | Freq: Once | ORAL | Status: AC
  Administered 2021-06-06: 22:00:00 40 meq via ORAL
  Filled 2021-06-06: qty 2

## 2021-06-06 MED ORDER — SODIUM CHLORIDE 0.9 % IV SOLN
250.0000 mL | INTRAVENOUS | Status: DC
  Administered 2021-06-06 – 2021-06-12 (×2): 250 mL via INTRAVENOUS

## 2021-06-06 MED ORDER — FAMOTIDINE 20 MG PO TABS
20.0000 mg | ORAL_TABLET | Freq: Every day | ORAL | Status: DC
  Administered 2021-06-07: 20 mg
  Filled 2021-06-06 (×2): qty 1

## 2021-06-06 MED ORDER — FENTANYL 2500MCG IN NS 250ML (10MCG/ML) PREMIX INFUSION
0.0000 ug/h | INTRAVENOUS | Status: DC
  Administered 2021-06-06: 25 ug/h via INTRAVENOUS
  Administered 2021-06-07: 125 ug/h via INTRAVENOUS
  Filled 2021-06-06 (×2): qty 250

## 2021-06-06 MED ORDER — FENTANYL CITRATE PF 50 MCG/ML IJ SOSY
PREFILLED_SYRINGE | INTRAMUSCULAR | Status: AC
  Filled 2021-06-06: qty 1

## 2021-06-06 MED ORDER — MIDAZOLAM HCL 2 MG/2ML IJ SOLN: 2.0000 mg | INTRAMUSCULAR | Status: DC | PRN

## 2021-06-06 NOTE — Progress Notes (Addendum)
eLink Physician-Brief Progress Note Patient Name: Brooke [name] DOB: Jul 28, 1934 MRN: [id number]   Date of Service  06/06/2021  HPI/Events of Note  E link was asked to follow electrolytes. Order placed at 17:32, not resulted at this time.   eICU Interventions  Please let us know when resulted so we can order replacement if needed.      Intervention Category Major Interventions: Electrolyte abnormality - evaluation and management  Oretha Milch 06/06/2021, 7:14 PM  Addendum 8 pm Patient has now come to the ICU She is on PRVC VT 350, RR 24, peep 12, fio2 100  O2 sat is 98 I am told she was desaturating earlier and has now improved SBP 90s on propofol and fentanyl drips, looks comfortable on camera  Prior CXR done shows high ETT, current ETT is at 21 cm at lip - not sure if the tube was advanced so will repeat a CXR Asked that RT come down on Fio2 and will repeat an ABG in a couple hours once this improves, ok to maintain o2 sat 92-93 BMP is pending, also check magnesium Have d/w RN  Addendum BP in 75-79 systolic range, RN had turned off propofol Will add peripheral levophed - suspect this is from sedative use Will try and use only fentanyl Also will give 40 meq oral potassium, mag is 2.1   Addendum at 10:40 pm CXR noed Advance the ETT in by 2 cm, placed a miscellaneous order- Rn/RT to mark that site  Will also send the CBC and BMP at around 2:30 am to make sure K improves  Addendum 630 am K is 3.2 this AM. E link/CCM repletion protocol in place O2 sat is 100 on 80%/peep 12 Reduce fio2 and ordered another abg for 730 am

## 2021-06-06 NOTE — ED Notes (Signed)
Pt placed on 15L NRB with 12L [place] high flow. Pt has expiratory wheezing and crackles with auscultating. Will notify MD.

## 2021-06-06 NOTE — Progress Notes (Signed)
RT transported patient from trauma A to [id number] with RN. No complications. Report given to 67M RT.

## 2021-06-06 NOTE — ED Notes (Signed)
Critical care at bedside  

## 2021-06-06 NOTE — ED Notes (Signed)
Breakfast orders placed 

## 2021-06-06 NOTE — Progress Notes (Signed)
VAST RN to bedside for USG IV placement for vasopressor infusion. Discussed with RN that vasopressor recommended through central access, and that if going through peripheral vein, IV watch should be used. USG PIV placed per protocol and site marked for IV watch to be placed for monitoring, primary RN aware.

## 2021-06-06 NOTE — Progress Notes (Signed)
PT Cancellation Note  Patient Details Name: Jesselyn Rask MRN: [id number] DOB: [birth year]   Cancelled Treatment:    Reason Eval/Treat Not Completed: Medical issues which prohibited therapy Pt currently on nonrebreather and not appropriate to work with PT. Will follow up as schedule allows.   Cindee Salt, DPT  Acute Rehabilitation Services  Pager: [phone] Office: [phone]    Lehman Prom 06/06/2021, 10:57 AM

## 2021-06-06 NOTE — ED Notes (Signed)
RT at bedside.

## 2021-06-06 NOTE — Addendum Note (Signed)
Addended by: Barbette Or on: 06/06/2021 08:57 AM   Modules accepted: Orders

## 2021-06-06 NOTE — ED Notes (Signed)
RT contacted by this RN. Lama MD aware.

## 2021-06-06 NOTE — ED Notes (Signed)
This RN contacted RT regarding pts low O2 sats despite being on HHF. Respiratory effort unlabored. Per RT, RN placed pt on NRB with HHF.

## 2021-06-06 NOTE — Progress Notes (Signed)
[facility]  PROGRESS NOTE  Jabree Surabian JJK:[id number] DOB: 1935/02/16 DOA: 06/06/2021 PCP: Philip Aspen, Limmie Patricia, MD   Brief HPI:   86 year old female with history of atrial fibrillation, hypertension, hyperlipidemia, depression presented with shortness of breath and hypoxemia.  Patient fell a month ago and hurt her lower back.  She was seen by orthopedic surgeon and is waiting for MRI of lumbar spine.  Patient said that she became short of breath on exertion at home.  When EMS was called her O2 sats was 64% on room air.  She was placed on nonrebreather.  Patient states she has been coughing up for 4 to 5 days with occasional yellow phlegm. In the ED chest x-ray showed multifocal pneumonia.  Lactic acid was 1.4.    Subjective   This morning patient is dyspneic, on nonrebreather.   Assessment/Plan:   Acute hypoxemic respiratory failure -Secondary to multifocal pneumonia -Started on Rocephin and doxycycline; blood cultures x2 obtained -Continue incentive spirometry every 2 hours while awake -Patient started on nonrebreather per HFNC -We will discontinue IV fluids -Start DuoNeb nebulizers every 6 hours scheduled  ?  Acute on chronic diastolic CHF -Patient has history of diastolic CHF -BNP elevated at 371 -We will give Lasix 40 mg IV x1, IV fluids discontinued as above. -Follow strict intake and output.  Chronic atrial fibrillation -Heart rate is controlled; continue metoprolol -Continue Eliquis for anticoagulation -Repeat echocardiogram ordered -Amiodarone on hold due to prolonged QTC   Weakness of both lower extremities -Patient had weakness in her legs after a fall and injured her lower  -She is followed by orthopedics as outpatient -Consider PT evaluation once patient is more stable   Hypertension -Blood pressure is stable but on the lower side -We will hold amlodipine due to soft BP -Continue metoprolol   Medications     amLODipine  5 mg Oral Daily    apixaban  5 mg Oral BID   atorvastatin  40 mg Oral Daily   ipratropium-albuterol  3 mL Nebulization Q6H   metoprolol tartrate  50 mg Oral BID     Data Reviewed:   CBG:  No results for input(s): GLUCAP in the last 168 hours.  SpO2: 91 % O2 Flow Rate (L/min): (S) 25 L/min FiO2 (%): (S) 100 %    Vitals:   06/06/21 1235 06/06/21 1246 06/06/21 1309 06/06/21 1315  BP:  (!) 145/72  106/72  Pulse: 63 65  64  Resp: 19 20  20   Temp:      TempSrc:      SpO2: 91% (!) 89% 91% 91%  Weight:      Height:        No intake or output data in the 24 hours ending 06/06/21 1341  No intake/output data recorded.  Filed Weights   05/29/2021 1726  Weight: 64.4 kg    Data Reviewed: Basic Metabolic Panel: Recent Labs  Lab 05/13/2021 1729 05/16/2021 2350 06/06/21 0500  NA 136 138 138  K 3.6 3.4* 3.3*  CL 103  --  106  CO2 24  --  23  GLUCOSE 116*  --  122*  BUN 24*  --  16  CREATININE 0.77  --  0.53  CALCIUM 9.2  --  8.8*   Liver Function Tests: Recent Labs  Lab 06/02/2021 1729  AST 33  ALT 37  ALKPHOS 106  BILITOT 1.4*  PROT 6.4*  ALBUMIN 3.3*   No results for input(s): LIPASE, AMYLASE in the last 168 hours. No  results for input(s): AMMONIA in the last 168 hours. CBC: Recent Labs  Lab 05/29/2021 1729 05/16/2021 2350 06/06/21 0500  WBC 10.6*  --  12.9*  NEUTROABS 8.7*  --   --   HGB 13.2 11.9* 12.6  HCT 39.5 35.0* 37.7  MCV 96.6  --  96.9  PLT 206  --  210   Cardiac Enzymes: No results for input(s): CKTOTAL, CKMB, CKMBINDEX, TROPONINI in the last 168 hours. BNP (last 3 results) Recent Labs    05/31/2021 1736  BNP 468.6*    ProBNP (last 3 results) No results for input(s): PROBNP in the last 8760 hours.  CBG: No results for input(s): GLUCAP in the last 168 hours.     Radiology Reports  DG Chest Port 1 View  Result Date: 05/22/2021 CLINICAL DATA:  Shortness of breath. EXAM: PORTABLE CHEST 1 VIEW COMPARISON:  May 26, 2020 FINDINGS: The lungs are  hyperinflated. Mild patchy atelectasis and/or infiltrate is seen within the mid right lung and right lung base. There is a small right pleural effusion. A moderate size left pleural effusion is also seen. No pneumothorax is identified. The heart size and mediastinal contours are within normal limits. There is mild calcification of the thoracic aorta multilevel degenerative changes are seen throughout the thoracic spine. IMPRESSION: 1. Mild patchy atelectasis and/or infiltrate within the mid right lung and right lung base with suspected airspace disease within the left lung base. Follow-up to resolution is recommended to exclude the presence of an underlying neoplasm. 2. Small right pleural effusion and moderate size left pleural effusion. Electronically Signed   By: Virgina Norfolk M.D.   On: 05/28/2021 19:27   ECHOCARDIOGRAM COMPLETE  Result Date: 06/06/2021    ECHOCARDIOGRAM REPORT   Patient Name:   [facility] Date of Exam: 06/06/2021 Medical Rec #:  [id number]   Height:       66.0 in Accession #:    [id number]  Weight:       142.0 lb Date of Birth:  1935/02/06   BSA:          1.729 m Patient Age:    24 years    BP:           151/58 mmHg Patient Gender: F           HR:           64 bpm. Exam Location:  Inpatient Procedure: 2D Echo, Cardiac Doppler and Color Doppler Indications:    Dyspnea R06.00  History:        Patient has prior history of Echocardiogram examinations, most                 recent 03/12/2020. CAD; Arrythmias:Atrial Fibrillation and                 Tachycardia. Pneumonia.  Sonographer:    Darlina Sicilian RDCS Referring Phys: [id number] Eben Burow  Sonographer Comments: Image acquisition challenging due to respiratory motion. Requiring oxygen with high flow nasal cannula at this time IMPRESSIONS  1. Left ventricular ejection fraction, by estimation, is >75%. The left ventricle has hyperdynamic function. The left ventricle has no regional wall motion abnormalities. There is mild left  ventricular hypertrophy. Left ventricular diastolic parameters are consistent with Grade II diastolic dysfunction (pseudonormalization). Elevated left atrial pressure.  2. Right ventricular systolic function is normal. The right ventricular size is normal. There is severely elevated pulmonary artery systolic pressure.  3. Right atrial size was mildly dilated.  4. Large  pleural effusion.  5. The mitral valve is normal in structure. Mild mitral valve regurgitation.  6. Tricuspid valve regurgitation is moderate.  7. The aortic valve is tricuspid. Aortic valve regurgitation is mild. Aortic valve sclerosis is present, with no evidence of aortic valve stenosis. Comparison(s): The left ventricular function is unchanged. FINDINGS  Left Ventricle: Left ventricular ejection fraction, by estimation, is >75%. The left ventricle has hyperdynamic function. The left ventricle has no regional wall motion abnormalities. The left ventricular internal cavity size was normal in size. There is mild left ventricular hypertrophy. Left ventricular diastolic parameters are consistent with Grade II diastolic dysfunction (pseudonormalization). Elevated left atrial pressure. Right Ventricle: The right ventricular size is normal. Right vetricular wall thickness was not assessed. Right ventricular systolic function is normal. There is severely elevated pulmonary artery systolic pressure. The tricuspid regurgitant velocity is 3.46 m/s, and with an assumed right atrial pressure of 15 mmHg, the estimated right ventricular systolic pressure is 62.9 mmHg. Left Atrium: Left atrial size was normal in size. Right Atrium: Right atrial size was mildly dilated. Pericardium: Trivial pericardial effusion is present. Mitral Valve: The mitral valve is normal in structure. Mild mitral valve regurgitation. Tricuspid Valve: The tricuspid valve is normal in structure. Tricuspid valve regurgitation is moderate. Aortic Valve: The aortic valve is tricuspid. Aortic  valve regurgitation is mild. Aortic valve sclerosis is present, with no evidence of aortic valve stenosis. Pulmonic Valve: The pulmonic valve was normal in structure. Pulmonic valve regurgitation is mild. Aorta: The aortic root is normal in size and structure. IAS/Shunts: The interatrial septum was not assessed. Additional Comments: There is a large pleural effusion.  LEFT VENTRICLE PLAX 2D LVIDd:         4.40 cm   Diastology LVIDs:         2.10 cm   LV e' medial:    5.96 cm/s LV PW:         0.90 cm   LV E/e' medial:  21.9 LV IVS:        1.30 cm   LV e' lateral:   8.36 cm/s LVOT diam:     1.80 cm   LV E/e' lateral: 15.6 LV SV:         55 LV SV Index:   32 LVOT Area:     2.54 cm  RIGHT VENTRICLE RV S prime:     11.90 cm/s TAPSE (M-mode): 1.8 cm LEFT ATRIUM             Index        RIGHT ATRIUM           Index LA diam:        3.30 cm 1.91 cm/m   RA Area:     16.40 cm LA Vol (A2C):   33.0 ml 19.09 ml/m  RA Volume:   41.20 ml  23.83 ml/m LA Vol (A4C):   23.9 ml 13.82 ml/m LA Biplane Vol: 28.0 ml 16.20 ml/m  AORTIC VALVE             PULMONIC VALVE LVOT Vmax:   114.50 cm/s PR End Diast Vel: 4.24 msec LVOT Vmean:  70.750 cm/s LVOT VTI:    0.217 m  AORTA Ao Root diam: 3.00 cm MITRAL VALVE                TRICUSPID VALVE MV Area (PHT): 4.70 cm     TR Peak grad:   47.9 mmHg MV Decel Time: 161 msec  TR Vmax:        346.00 cm/s MV E velocity: 130.67 cm/s                             SHUNTS                             Systemic VTI:  0.22 m                             Systemic Diam: 1.80 cm Dorris Carnes MD Electronically signed by Dorris Carnes MD Signature Date/Time: 06/06/2021/11:45:59 AM    Final        Antibiotics: Anti-infectives (From admission, onward)    Start     Dose/Rate Route Frequency Ordered Stop   06/06/21 2000  cefTRIAXone (ROCEPHIN) 1 g in sodium chloride 0.9 % 100 mL IVPB        1 g 200 mL/hr over 30 Minutes Intravenous Every 24 hours 06/07/2021 2155     05/22/2021 2200  doxycycline (VIBRAMYCIN) 100  mg in sodium chloride 0.9 % 250 mL IVPB        100 mg 125 mL/hr over 120 Minutes Intravenous Every 12 hours 05/26/2021 2155     05/27/2021 1945  cefTRIAXone (ROCEPHIN) 1 g in sodium chloride 0.9 % 100 mL IVPB        1 g 200 mL/hr over 30 Minutes Intravenous  Once 05/19/2021 1935 06/04/2021 2156   06/02/2021 1945  azithromycin (ZITHROMAX) 500 mg in sodium chloride 0.9 % 250 mL IVPB  Status:  Discontinued        500 mg 250 mL/hr over 60 Minutes Intravenous  Once 05/29/2021 1935 06/04/2021 1953         DVT prophylaxis: Apixaban  Code Status: Full code  Family Communication: No family at bedside   Consultants:   Procedures:     Objective    Physical Examination:   General: Appears in  no respiratory distress Cardiovascular: S1-S2, regular, no murmur auscultated Respiratory: Bilateral rhonchi auscultated Abdomen: Abdomen is soft, nontender, no organomegaly Extremities: No edema in the lower extremities Neurologic: Alert, oriented x 3, no focal deficit noted   Status is: Inpatient  Dispo: The patient is from: Home              Anticipated d/c is to: To be decided              Anticipated d/c date is: 06/09/2019              Patient currently not stable for discharge  Barrier to discharge-ongoing management for acute hypoxemic respiratory failure  COVID-19 Labs  No results for input(s): DDIMER, FERRITIN, LDH, CRP in the last 72 hours.  Lab Results  Component Value Date   [place] NEGATIVE 05/16/2021   [place] NEGATIVE 08/16/2020            Recent Results (from the past 240 hour(s))  Resp Panel by RT-PCR (Flu A&B, Covid)     Status: None   Collection Time: 05/30/2021  5:28 PM   Specimen: Nasopharyngeal(NP) swabs in vial transport medium  Result Value Ref Range Status   SARS Coronavirus 2 by RT PCR NEGATIVE NEGATIVE Final    Comment: (NOTE) SARS-CoV-2 target nucleic acids are NOT DETECTED.  The SARS-CoV-2 RNA is generally detectable in upper  respiratory specimens during the acute phase of  infection. The lowest concentration of SARS-CoV-2 viral copies this assay can detect is 138 copies/mL. A negative result does not preclude SARS-Cov-2 infection and should not be used as the sole basis for treatment or other patient management decisions. A negative result may occur with  improper specimen collection/handling, submission of specimen other than nasopharyngeal swab, presence of viral mutation(s) within the areas targeted by this assay, and inadequate number of viral copies(<138 copies/mL). A negative result must be combined with clinical observations, patient history, and epidemiological information. The expected result is Negative.  Fact Sheet for Patients:  EntrepreneurPulse.com.au  Fact Sheet for Healthcare Providers:  IncredibleEmployment.be  This test is no t yet approved or cleared by the [place] FDA and  has been authorized for detection and/or diagnosis of SARS-CoV-2 by FDA under an Emergency Use Authorization (EUA). This EUA will remain  in effect (meaning this test can be used) for the duration of the COVID-19 declaration under Section 564(b)(1) of the Act, 21 U.S.C.section 360bbb-3(b)(1), unless the authorization is terminated  or revoked sooner.       Influenza A by PCR NEGATIVE NEGATIVE Final   Influenza B by PCR NEGATIVE NEGATIVE Final    Comment: (NOTE) The Xpert Xpress SARS-CoV-2/FLU/RSV plus assay is intended as an aid in the diagnosis of influenza from Nasopharyngeal swab specimens and should not be used as a sole basis for treatment. Nasal washings and aspirates are unacceptable for Xpert Xpress SARS-CoV-2/FLU/RSV testing.  Fact Sheet for Patients: EntrepreneurPulse.com.au  Fact Sheet for Healthcare Providers: IncredibleEmployment.be  This test is not yet approved or cleared by the [place] FDA and has been  authorized for detection and/or diagnosis of SARS-CoV-2 by FDA under an Emergency Use Authorization (EUA). This EUA will remain in effect (meaning this test can be used) for the duration of the COVID-19 declaration under Section 564(b)(1) of the Act, 21 U.S.C. section 360bbb-3(b)(1), unless the authorization is terminated or revoked.  Performed at [facility], [place] [address], [place], [place] [address]   Culture, blood (Routine x 2)     Status: None (Preliminary result)   Collection Time: 05/10/2021  5:59 PM   Specimen: BLOOD  Result Value Ref Range Status   Specimen Description BLOOD LEFT ANTECUBITAL  Final   Special Requests   Final    BOTTLES DRAWN AEROBIC AND ANAEROBIC Blood Culture results may not be optimal due to an inadequate volume of blood received in culture bottles   Culture   Final    NO GROWTH < 12 HOURS Performed at [facility], [place] [address] [place], [place] [address]    Report Status PENDING  Incomplete  Culture, blood (Routine x 2)     Status: None (Preliminary result)   Collection Time: 06/04/2021  8:00 PM   Specimen: BLOOD RIGHT HAND  Result Value Ref Range Status   Specimen Description BLOOD RIGHT HAND  Final   Special Requests   Final    BOTTLES DRAWN AEROBIC AND ANAEROBIC Blood Culture results may not be optimal due to an excessive volume of blood received in culture bottles   Culture   Final    NO GROWTH < 12 HOURS Performed at [facility], [place] [address], [place], [place] [address]    Report Status PENDING  Incomplete    Oswald Hillock   [facility] If 7PM-7AM, please contact night-coverage at [email or web address], Office  [phone]   06/06/2021, 1:41 PM  LOS: 1 day

## 2021-06-06 NOTE — Progress Notes (Signed)
Pt arrived from ER with 1 bag of personal clothes and 1 set of dentures. 1 gold colored ring removed from left hand and placed in cup into personal belonging bag.

## 2021-06-06 NOTE — Consult Note (Signed)
NAME:  Brooke [name], MRN:  [id number], DOB:  [birth year], LOS: 1 ADMISSION DATE:  06/04/2021, CONSULTATION DATE:  11/28 REFERRING MD:  Darrick Meigs , CHIEF COMPLAINT:  respiratory failure    History of Present Illness:  This is an 86 year old female patient with past medical history as per below.  Presented to the emergency room chronic 11/27 with chief complaint of cough, shortness of breath, and worsening weakness.  Apparently had fallen about 1 month prior to presentation since that time has had progressive weakness, feels like at times her legs just "give out" she is required help with activities of daily living at home by her husband.  Over the last 4-5 days had developed a cough, said she felt like she was "warm" on day of admission had developed worsening and progressive shortness of breath.  EMS was called and on arrival room air saturations 64% in ER she was found to have a temperature of 101.3 chest x-ray showed right lower lobe airspace disease as well as left-sided pleural effusion she was admitted with a working diagnosis of pneumonia and possible heart failure therapeutic interventions included supplemental oxygen, IV ceftriaxone and doxycycline, gentle IV hydration and plan to admit.  Over the following 24 hours her respiratory failure continued to worsen going from supplemental oxygen via nasal cannula then to high flow nasal cannula then to mix of both high flow and nonrebreather mask and unable to bring saturation greater than 80%.  Because of this pulmonary was asked to evaluate  Pertinent  Medical History  Afib on apixaban, hypertension, hyperlipidemia, depression, recent lower back injury being followed by orthopedics and awaiting lumbar MRI, coronary artery disease, hyperlipidemia osteoarthritis  Significant Hospital Events: Including procedures, antibiotic start and stop dates in addition to other pertinent events   11/27 admitted w/ working dx of PNA ceftriaxone and doxy started. Resp  viral panel neg.  11/28 progressive resp failure no improvement w/ high flow oxygen and NRB. Intubated.  Interim History / Subjective:  As above, progressive respiratory failure  Objective   Blood pressure 110/88, pulse 70, temperature 98.5 F (36.9 C), temperature source Oral, resp. rate 20, height 5\' 6"  (1.676 m), weight 64.4 kg, SpO2 (Abnormal) 82 %.    FiO2 (%):  [100 %] 100 %   Intake/Output Summary (Last 24 hours) at 06/06/2021 1723 Last data filed at 06/06/2021 1342 Gross per 24 hour  Intake no documentation  Output 1100 ml  Net -1100 ml   Filed Weights   05/26/2021 1726  Weight: 64.4 kg    Examination: General: Very pleasant 86 year old female now intubated with improved pulse oximetry postintubation and PEEP HENT: Normocephalic atraumatic edentulous her upper and lowers were removed for intubation, now orally intubated Lungs: Diffuse scattered rhonchi diminished left base marked accessory use prior to intubation with pulse oximetry only 80% on both 9 rebreather and heated high flow, pulse oximetry now in 90s Cardiovascular: Regular irregular with atrial fibrillation on telemetry Abdomen: Soft not tender, did report nausea prior to intubation Extremities: Warm dry brisk capillary refill Neuro: Awake oriented no focal deficits GU: Due to void  Resolved Hospital Problem list     Assessment & Plan:  Acute hypoxic respiratory failure secondary to community-acquired pneumonia, further complicated by pleural effusion and pulmonary edema  -Doubt pulmonary emboli given she is already been on apixaban -Now intubated Plan Full ventilator support, starting at 6 mL/kg predicted body weight Titrate PEEP/FiO2 for pulse oximetry greater than 88% VAP bundle PAD protocol RASS goal -2 Respiratory culture  Continue day #2 ceftriaxone and doxycycline.  Urine strep and Legionella ordered Admit to intensive care Follow-up ABG   SIRS/sepsis 2/2 Pna  Plan Cont tele  Abx as  above Watch how she responds to lasix   Afib w/ acute diastolic HF  Plan Tele Holding her amiodarone for now, and Norvasc  Cont DOAC vt Lasix as above  K > 4/Mg > 2  Fluid and electrolyte imbalance: hypokalemia  Plan Replace and recheck   HL  Plan Cont statin   Glaucoma  Plan  Cont eye gtts  Back pain Plan Supportive care  Best Practice (right click and "Reselect all SmartList Selections" daily)   Diet/type: NPO w/ meds via tube DVT prophylaxis: DOAC GI prophylaxis: PPI Lines: N/A Foley:  N/A Code Status:  full code Last date of multidisciplinary goals of care discussion [pending] I attempted 3 times to contact son with no success going into voicemail  Labs   CBC: Recent Labs  Lab 08-May-2021 1729 08-May-2021 2350 06/06/21 0500  WBC 10.6*  --  12.9*  NEUTROABS 8.7*  --   --   HGB 13.2 11.9* 12.6  HCT 39.5 35.0* 37.7  MCV 96.6  --  96.9  PLT 206  --  210    Basic Metabolic Panel: Recent Labs  Lab 08-May-2021 1729 08-May-2021 2350 06/06/21 0500  NA 136 138 138  K 3.6 3.4* 3.3*  CL 103  --  106  CO2 24  --  23  GLUCOSE 116*  --  122*  BUN 24*  --  16  CREATININE 0.77  --  0.53  CALCIUM 9.2  --  8.8*   GFR: Estimated Creatinine Clearance: 49 mL/min (by C-G formula based on SCr of 0.53 mg/dL). Recent Labs  Lab 08-May-2021 1729 08-May-2021 1759 08-May-2021 2000 06/06/21 0500  WBC 10.6*  --   --  12.9*  LATICACIDVEN  --  1.4 1.1  --     Liver Function Tests: Recent Labs  Lab 08-May-2021 1729  AST 33  ALT 37  ALKPHOS 106  BILITOT 1.4*  PROT 6.4*  ALBUMIN 3.3*   No results for input(s): LIPASE, AMYLASE in the last 168 hours. No results for input(s): AMMONIA in the last 168 hours.  ABG    Component Value Date/Time   PHART 7.457 (H) 12/17/2020 2350   PCO2ART 33.4 12/17/2020 2350   PO2ART 51 (L) 12/17/2020 2350   HCO3 23.7 12/17/2020 2350   TCO2 25 12/17/2020 2350   O2SAT 88.0 12/17/2020 2350     Coagulation Profile: Recent Labs  Lab  08-May-2021 1729  INR 1.5*    Cardiac Enzymes: No results for input(s): CKTOTAL, CKMB, CKMBINDEX, TROPONINI in the last 168 hours.  HbA1C: No results found for: HGBA1C  CBG: No results for input(s): GLUCAP in the last 168 hours.  Review of Systems:   Not able   Past Medical History:  She,  has a past medical history of Atrial fibrillation (HCC), CAD (coronary artery disease), Glaucoma, Hyperlipidemia, OA (osteoarthritis) of hip, and Tachycardia.   Surgical History:   Past Surgical History:  Procedure Laterality Date   ABLATION     LEFT HEART CATH AND CORONARY ANGIOGRAPHY N/A 08/19/2020   Procedure: LEFT HEART CATH AND CORONARY ANGIOGRAPHY;  Surgeon: Lyn RecordsSmith, Henry W, MD;  Location: [facility];  Service: Cardiovascular;  Laterality: N/A;     Social History:   reports that she has never smoked. She has never used smokeless tobacco. She reports that she does not  drink alcohol and does not use drugs.   Family History:  Her family history includes Aortic stenosis in her sister; CAD in her father; CVA in her mother.   Allergies Allergies  Allergen Reactions   Hydrocodone-Acetaminophen Anaphylaxis    Pt tolerates plain acetaminophen     Home Medications  Prior to Admission medications   Medication Sig Start Date End Date Taking? Authorizing Provider  acetaminophen (TYLENOL) 325 MG tablet Take 650 mg by mouth every 6 (six) hours as needed for mild pain.  05/08/14  Yes [provider]  ALPRAZolam (XANAX) 0.5 MG tablet TAKE 1 TABLET BY MOUTH  TWICE DAILY AS NEEDED FOR  ANXIETY Patient taking differently: Take 0.25 mg by mouth 2 (two) times daily. 05/03/21  Yes Philip Aspen, Limmie Patricia, MD  amiodarone (PACERONE) 200 MG tablet Take 1 tablet (200 mg total) by mouth daily. Pt needs to keep upcoming appt in Feb, 2023 to get a 62yr supply 05/05/21  Yes Lanier Prude, MD  amLODipine (NORVASC) 5 MG tablet Take 1 tablet (5 mg total) by mouth daily. 12/09/20  Yes Lanier Prude, MD  apixaban (ELIQUIS) 5 MG TABS tablet Take 1 tablet (5 mg total) by mouth 2 (two) times daily. 10/05/20  Yes Lanier Prude, MD  aspirin 81 MG EC tablet Take 81 mg by mouth daily.  11/24/16  Yes [provider]  atorvastatin (LIPITOR) 40 MG tablet Take 40 mg by mouth daily.  11/23/16  Yes [provider]  carboxymethylcellul-glycerin (OPTIVE) 0.5-0.9 % ophthalmic solution Place 1-2 drops into both eyes 3 (three) times daily as needed for dry eyes. Thera Tears   Yes [provider]  Cholecalciferol 25 MCG (1000 UT) tablet Take 1,000 Units by mouth daily.    Yes [provider]  dorzolamide-timolol (COSOPT) 22.3-6.8 MG/ML ophthalmic solution Place 1 drop into the right eye 2 (two) times daily. 08/26/20  Yes [provider]  hydrocortisone cream 1 % Apply 1 application topically daily as needed for itching.   Yes [provider]  latanoprost (XALATAN) 0.005 % ophthalmic solution INSTILL 1 DROP INTO BOTH  EYES AT BEDTIME Patient taking differently: Place 1 drop into both eyes at bedtime. 05/18/21  Yes Philip Aspen, Limmie Patricia, MD  losartan (COZAAR) 50 MG tablet Take 1 tablet (50 mg total) by mouth daily. 10/22/20  Yes Jodelle Red, MD  metoprolol tartrate (LOPRESSOR) 50 MG tablet Take 1 tablet (50 mg total) by mouth 2 (two) times daily. 12/09/20  Yes Lanier Prude, MD  oxyCODONE-acetaminophen (PERCOCET/ROXICET) 5-325 MG tablet Take 1 tablet by mouth every 6 (six) hours as needed for severe pain. 04/17/21  Yes Jacalyn Lefevre, MD  PARoxetine (PAXIL) 20 MG tablet Take 20 mg by mouth every evening. 05/18/21  Yes [provider]  tiZANidine (ZANAFLEX) 2 MG tablet Take 2 mg by mouth at bedtime as needed for muscle spasms. 05/18/21  Yes [provider]  baclofen (LIORESAL) 10 MG tablet Take 1 tablet (10 mg total) by mouth 3 (three) times daily as needed for muscle spasms. Patient not taking: Reported on 06/21/2021  04/20/21   Kathryne Hitch, MD  OXYGEN Inhale 2 L into the lungs daily as needed (if she feels bad). Patient not taking: Reported on Jun 21, 2021    [provider]  predniSONE (DELTASONE) 50 MG tablet Take one tablet daily for 5 days. Patient not taking: Reported on 06-21-2021 04/20/21   Kathryne Hitch, MD     Critical care time:  46 min    Erick Colace ACNP-BC [place] Pager # [phone] OR # [phone] if no answer

## 2021-06-06 NOTE — Progress Notes (Signed)
RT placed patient on HHFNC 25L 100% per sats in high 80's on NRB. Patient tolerating well at this time. RN aware. RT will continue to monitor.

## 2021-06-06 NOTE — Procedures (Signed)
Intubation Procedure Note  Brooke [name]  [id number]  1934/08/14  Date:06/06/21  Time:5:48 PM   Provider Performing:Pete E Tanja Port    Procedure: Intubation (31500)  Indication(s) Respiratory Failure  Consent Risks of the procedure as well as the alternatives and risks of each were explained to the patient and/or caregiver.  Consent for the procedure was obtained and is signed in the bedside chart   Anesthesia Etomidate, Versed, Fentanyl, and Rocuronium   Time Out Verified patient identification, verified procedure, site/side was marked, verified correct patient position, special equipment/implants available, medications/allergies/relevant history reviewed, required imaging and test results available.   Sterile Technique Usual hand hygeine, masks, and gloves were used   Procedure Description Patient positioned in bed supine.  Sedation given as noted above.  Patient was intubated with endotracheal tube using Glidescope.  View was Grade 1 full glottis .  Number of attempts was 1.  Colorimetric CO2 detector was consistent with tracheal placement.   Complications/Tolerance None; patient tolerated the procedure well. Chest X-ray is ordered to verify placement.   EBL Minimal   Specimen(s) None  Simonne Martinet ACNP-BC [facility] Pulmonary/Critical Care Pager # [phone] OR # [phone] if no answer

## 2021-06-06 NOTE — ED Notes (Signed)
Sharl Ma, MD messaged and paged regarding pts SpO2. Respiratory effort remains unlabored.

## 2021-06-06 NOTE — Progress Notes (Signed)
  Echocardiogram 2D Echocardiogram has been performed.  Leta Jungling M 06/06/2021, 11:01 AM

## 2021-06-06 NOTE — ED Notes (Signed)
Lama MD made aware of pt being on HHF and NRB. SpO2 91% at this time.

## 2021-06-07 DIAGNOSIS — J189 Pneumonia, unspecified organism: Secondary | ICD-10-CM | POA: Diagnosis not present

## 2021-06-07 DIAGNOSIS — L8990 Pressure ulcer of unspecified site, unspecified stage: Secondary | ICD-10-CM | POA: Insufficient documentation

## 2021-06-07 LAB — CBC
HCT: 37.2 % (ref 36.0–46.0)
Hemoglobin: 12.1 g/dL (ref 12.0–15.0)
MCH: 31.1 pg (ref 26.0–34.0)
MCHC: 32.5 g/dL (ref 30.0–36.0)
MCV: 95.6 fL (ref 80.0–100.0)
Platelets: 242 10*3/uL (ref 150–400)
RBC: 3.89 MIL/uL (ref 3.87–5.11)
RDW: 14.1 % (ref 11.5–15.5)
WBC: 17.3 10*3/uL — ABNORMAL HIGH (ref 4.0–10.5)
nRBC: 0 % (ref 0.0–0.2)

## 2021-06-07 LAB — BASIC METABOLIC PANEL
Anion gap: 8 (ref 5–15)
Anion gap: 8 (ref 5–15)
BUN: 17 mg/dL (ref 8–23)
BUN: 18 mg/dL (ref 8–23)
CO2: 27 mmol/L (ref 22–32)
CO2: 26 mmol/L (ref 22–32)
Calcium: 8.8 mg/dL — ABNORMAL LOW (ref 8.9–10.3)
Calcium: 8.9 mg/dL (ref 8.9–10.3)
Chloride: 105 mmol/L (ref 98–111)
Chloride: 105 mmol/L (ref 98–111)
Creatinine, Ser: 0.79 mg/dL (ref 0.44–1.00)
Creatinine, Ser: 0.71 mg/dL (ref 0.44–1.00)
GFR, Estimated: >60 mL/min (ref >60)
GFR, Estimated: >60 mL/min (ref >60)
Glucose, Bld: 133 mg/dL — ABNORMAL HIGH (ref 70–99)
Glucose, Bld: 112 mg/dL — ABNORMAL HIGH (ref 70–99)
Potassium: 3.2 mmol/L — ABNORMAL LOW (ref 3.5–5.1)
Potassium: 4 mmol/L (ref 3.5–5.1)
Sodium: 140 mmol/L (ref 135–145)
Sodium: 139 mmol/L (ref 135–145)

## 2021-06-07 LAB — POCT I-STAT 7, (LYTES, BLD GAS, ICA,H+H)
Acid-Base Excess: 1 mmol/L (ref 0.0–2.0)
Bicarbonate: 26.4 mmol/L (ref 20.0–28.0)
Calcium, Ion: 1.3 mmol/L (ref 1.15–1.40)
HCT: 34 % — ABNORMAL LOW (ref 36.0–46.0)
Hemoglobin: 11.6 g/dL — ABNORMAL LOW (ref 12.0–15.0)
O2 Saturation: 97 %
Patient temperature: 98.6
Potassium: 4.7 mmol/L (ref 3.5–5.1)
Sodium: 140 mmol/L (ref 135–145)
TCO2: 28 mmol/L (ref 22–32)
pCO2 arterial: 44.8 mmHg (ref 32.0–48.0)
pH, Arterial: 7.378 (ref 7.350–7.450)
pO2, Arterial: 94 mmHg (ref 83.0–108.0)

## 2021-06-07 LAB — MAGNESIUM: Magnesium: 1.9 mg/dL (ref 1.7–2.4)

## 2021-06-07 LAB — GLUCOSE, CAPILLARY
Glucose-Capillary: 162 mg/dL — ABNORMAL HIGH (ref 70–99)
Glucose-Capillary: 123 mg/dL — ABNORMAL HIGH (ref 70–99)
Glucose-Capillary: 112 mg/dL — ABNORMAL HIGH (ref 70–99)
Glucose-Capillary: 94 mg/dL (ref 70–99)
Glucose-Capillary: 111 mg/dL — ABNORMAL HIGH (ref 70–99)
Glucose-Capillary: 108 mg/dL — ABNORMAL HIGH (ref 70–99)

## 2021-06-07 LAB — HEMOGLOBIN A1C
Hgb A1c MFr Bld: 5.9 % — ABNORMAL HIGH (ref 4.8–5.6)
Mean Plasma Glucose: 123 mg/dL

## 2021-06-07 LAB — TRIGLYCERIDES: Triglycerides: 74 mg/dL (ref <150)

## 2021-06-07 LAB — PHOSPHORUS: Phosphorus: 2.5 mg/dL (ref 2.5–4.6)

## 2021-06-07 MED ORDER — VITAL AF 1.2 CAL PO LIQD
1000.0000 mL | ORAL | Status: DC
  Administered 2021-06-07: 1000 mL

## 2021-06-07 MED ORDER — POTASSIUM CHLORIDE CRYS ER 20 MEQ PO TBCR
EXTENDED_RELEASE_TABLET | ORAL | Status: AC
  Filled 2021-06-07: qty 2

## 2021-06-07 MED ORDER — FUROSEMIDE 10 MG/ML IJ SOLN
INTRAMUSCULAR | Status: AC
  Filled 2021-06-07: qty 4

## 2021-06-07 MED ORDER — DOXYCYCLINE HYCLATE 100 MG PO TABS
ORAL_TABLET | ORAL | Status: AC
  Administered 2021-06-07: 100 mg
  Filled 2021-06-07: qty 1

## 2021-06-07 MED ORDER — FUROSEMIDE 10 MG/ML IJ SOLN
40.0000 mg | Freq: Once | INTRAMUSCULAR | Status: AC
  Administered 2021-06-07: 40 mg via INTRAVENOUS

## 2021-06-07 MED ORDER — POTASSIUM CHLORIDE 10 MEQ/50ML IV SOLN
INTRAVENOUS | Status: AC
  Filled 2021-06-07: qty 50

## 2021-06-07 MED ORDER — POTASSIUM CHLORIDE 10 MEQ/100ML IV SOLN
INTRAVENOUS | Status: AC
  Filled 2021-06-07: qty 400

## 2021-06-07 MED ORDER — POTASSIUM CHLORIDE 10 MEQ/100ML IV SOLN
10.0000 meq | INTRAVENOUS | Status: AC
  Administered 2021-06-07 (×4): 10 meq via INTRAVENOUS

## 2021-06-07 MED ORDER — DOXYCYCLINE HYCLATE 100 MG PO TABS
ORAL_TABLET | ORAL | Status: AC
  Filled 2021-06-07: qty 1

## 2021-06-07 MED ORDER — POTASSIUM CHLORIDE 20 MEQ PO PACK
20.0000 meq | PACK | ORAL | Status: AC
  Administered 2021-06-07 (×2): 20 meq

## 2021-06-07 MED ORDER — POTASSIUM CHLORIDE 10 MEQ/100ML IV SOLN
INTRAVENOUS | Status: AC
  Filled 2021-06-07: qty 100

## 2021-06-07 MED ORDER — POTASSIUM CHLORIDE 20 MEQ PO PACK
PACK | ORAL | Status: AC
  Filled 2021-06-07: qty 2

## 2021-06-07 MED ORDER — DOXYCYCLINE HYCLATE 100 MG PO TABS
100.0000 mg | ORAL_TABLET | Freq: Two times a day (BID) | ORAL | Status: DC
  Administered 2021-06-07 – 2021-06-08 (×2): 100 mg
  Filled 2021-06-07: qty 1

## 2021-06-07 NOTE — Progress Notes (Signed)
Initial Nutrition Assessment  DOCUMENTATION CODES:   Not applicable  INTERVENTION:   Initiate tube feeds via OG tube: - Vital AF 1.2 @ 55 ml/hr (1320 ml/day)  Tube feeding regimen provides 1584 kcal, 99 grams of protein, and 1071 ml of H2O.  NUTRITION DIAGNOSIS:   Inadequate oral intake related to inability to eat as evidenced by NPO status.  GOAL:   Patient will meet greater than or equal to 90% of their needs  MONITOR:   Vent status, Labs, Weight trends, TF tolerance, Skin, I & O's  REASON FOR ASSESSMENT:   Ventilator, Consult Enteral/tube feeding initiation and management  ASSESSMENT:   86 year old female who presented to the ED on 11/27 with back pain and was found to be in respiratory distress. PMH of atrial fibrillation, HTN, HLD, depression. Pt admitted with multifocal PNA.  11/28 - intubated  Per CCM, continuing to treat pt for CAP with ARDS protocol on the ventilator. Pt also with volume overload, acute diastolic heart failure exacerbation.  Consult received for enteral nutrition initiation and management. Pt with OG tube extending below the diaphragm per chest x-ray. RD to order tube feeds to start today.  Unable to obtain diet and weight history at this time. Reviewed available weight history in chart. Current weight up significantly compared to weights from October 2022 encounters. Suspect weight is falsely elevated secondary to volume overload. Suspect dry weight is closer to ~64 kg. Generalized non-pitting edema noted on nursing assessment.  Patient is currently intubated on ventilator support MV: 8.9 L/min Temp (24hrs), Avg:99 F (37.2 C), Min:98.1 F (36.7 C), Max:100.7 F (38.2 C) BP (cuff): 145/65 MAP (cuff): 89  Drips: Propofol: 5.8 ml/hr (provides 153 kcal daily from lipid) Fentanyl Levophed  Medications reviewed and include: pepcid, SSI q 4 hours, IV abx, IV KCl 10 mEq x 4 runs  Labs reviewed. CBG's: 112-165 x 24 hours  UOP: 2290 ml x  24 hours OGT: 100 ml x 12 hours I/O's: -1.0 L since admit  NUTRITION - FOCUSED PHYSICAL EXAM:  Unable to complete at this time. RD working remotely.  Diet Order:   Diet Order     None       EDUCATION NEEDS:   No education needs have been identified at this time  Skin:  Skin Assessment: Skin Integrity Issues: Stage I: R back, L back  Last BM:  no documented BM  Height:   Ht Readings from Last 1 Encounters:  05/20/2021 5\' 6"  (1.676 m)    Weight:   Wt Readings from Last 1 Encounters:  06/07/21 71.1 kg    BMI:  Body mass index is 25.3 kg/m.  Estimated Nutritional Needs:   Kcal:  1500-1700  Protein:  85-100 grams  Fluid:  1.5-1.7 L    06/09/21, MS, RD, LDN Inpatient Clinical Dietitian Please see AMiON for contact information.

## 2021-06-07 NOTE — Progress Notes (Signed)
NP to room to discuss low heart rates while on vasopressor support. Continue weaning medications as able, see new orders

## 2021-06-07 NOTE — Progress Notes (Signed)
NAME:  Brooke [name], MRN:  [id number], DOB:  [birth year], LOS: 2 ADMISSION DATE:  05/21/2021, CONSULTATION DATE:  11/28 REFERRING MD:  Sharl Ma , CHIEF COMPLAINT:  respiratory failure    History of Present Illness:  This is an 86 year old female patient with past medical history as per below.  Presented to the emergency room chronic 11/27 with chief complaint of cough, shortness of breath, and worsening weakness.  Apparently had fallen about 1 month prior to presentation since that time has had progressive weakness, feels like at times her legs just "give out" she is required help with activities of daily living at home by her husband.  Over the last 4-5 days had developed a cough, said she felt like she was "warm" on day of admission had developed worsening and progressive shortness of breath.  EMS was called and on arrival room air saturations 64% in ER she was found to have a temperature of 101.3 chest x-ray showed right lower lobe airspace disease as well as left-sided pleural effusion she was admitted with a working diagnosis of pneumonia and possible heart failure therapeutic interventions included supplemental oxygen, IV ceftriaxone and doxycycline, gentle IV hydration and plan to admit.  Over the following 24 hours her respiratory failure continued to worsen going from supplemental oxygen via nasal cannula then to high flow nasal cannula then to mix of both high flow and nonrebreather mask and unable to bring saturation greater than 80%.  Because of this pulmonary was asked to evaluate  Pertinent  Medical History  Afib on apixaban CAD, HTN, HLD, Depression Recent lower back injury being followed by orthopedics (awaiting lumbar MRI) Osteoarthritis  Significant Hospital Events: Including procedures, antibiotic start and stop dates in addition to other pertinent events   11/27 admitted w/ working dx of PNA ceftriaxone and doxy started. Resp viral panel neg.  11/28 progressive resp failure no  improvement w/ high flow oxygen and NRB. Intubated. 11/29: weaning Vent support, Cont.  abx, diuresis. Still bradycardic ECHO hyperdynamic w/ EF 75% gd 2 diastolic dysfxn. No WM abnormality. RA size mildly elevated.   Interim History / Subjective:  Nods appropriately   Objective   Blood pressure (!) 97/45, pulse (!) 40, temperature 99.5 F (37.5 C), resp. rate (!) 24, height 5\' 6"  (1.676 m), weight 71.1 kg, SpO2 99 %.    Vent Mode: PRVC FiO2 (%):  [50 %-100 %] 50 % Set Rate:  [24 bmp] 24 bmp Vt Set:  [350 mL] 350 mL PEEP:  [10 cmH20-12 cmH20] 10 cmH20 Plateau Pressure:  [18 cmH20-23 cmH20] 18 cmH20   Intake/Output Summary (Last 24 hours) at 06/07/2021 1003 Last data filed at 06/07/2021 0700 Gross per 24 hour  Intake 1419.78 ml  Output 2390 ml  Net -970.22 ml   Filed Weights   05/12/2021 1726 06/06/21 2345 06/07/21 0426  Weight: 64.4 kg 71.1 kg 71.1 kg    Examination: General 86 year old female resting in bed. No distress weaning oxygen/peep Hent NCAT no JVD orally intubated.  Pulm scattered rhonchi. No accessory use. Weaning support Card brady in 32s. No MRG. Echo reviewed  Ext warm and dry no sig edema Abd soft  Gu cl yellow  Resolved Hospital Problem list     Assessment & Plan:   Acute Hypoxic respiratory Failure 2/2 CAP  Pleural effusions, Pulmonary Edema -pcxr improving aeration  - Decreasing oxygenation requirements - Urine Strep negative Plan: Cont full vent support. Weaning PEEP/FIO2 Cont IV diuresis goal -1 liter  RASS goal 0  to -1  Cont BDs Cont day 2 doxy and ceftriaxone  F/u pending sputum and urine legionella  Am cxr   Sepsis from CAP w/ septic shock vs drug induced hypotension  - Afebrile - Hypotensive: Requiring low dose Norepi for MAP >65 - Bradycardic: 40's Plan: Weaning sedation  Abx as above  F/u cultures Weaning Norepi   Hx. Afib currently SB w/ Acute Diastolic HF echo noted above  Plan: Cont DOAC and ASA Holding her amiodarone  and norvasc given bradycardia  IV lasix K goal > 4. Mg > 2  Fluid and Electrolyte Abnormalities - K supp Plan: Replace and recheck   Hx HLD  Plan Home statin   Glaucoma Plan: Cont eye gtts  Back Pain - Denies pain Plan: Supportive care PRN meds  Bowel rx   Hx Depression Plan: Pasxil   Best Practice (right click and "Reselect all SmartList Selections" daily)   Diet/type: tubefeeds DVT prophylaxis: DOAC GI prophylaxis: PPI Lines: N/A Foley:  N/A Code Status:  full code Last date of multidisciplinary goals of care discussion [pending]  My cct 32 min   Erick Colace ACNP-BC [place] Pager # [phone] OR # [phone] if no answer

## 2021-06-07 NOTE — Progress Notes (Signed)
[facility] ADULT ICU REPLACEMENT PROTOCOL   The patient does apply for the [facility] Adult ICU Electrolyte Replacment Protocol based on the criteria listed below:   1.Exclusion criteria: TCTS patients, ECMO patients, and Dialysis patients 2. Is GFR >/= 30 ml/min? Yes.    Patient's GFR today is >60 3. Is SCr </= 2? Yes.   Patient's SCr is 0.79 mg/dL 4. Did SCr increase >/= 0.5 in 24 hours? No. 5.Pt's weight >40kg  Yes.   6. Abnormal electrolyte(s): K+ 3.2  7. Electrolytes replaced per protocol 8.  Call MD STAT for K+ </= 2.5, Phos </= 1, or Mag </= 1 Physician:  n/a  Melvern Banker 06/07/2021 5:23 AM

## 2021-06-07 NOTE — Progress Notes (Addendum)
PT Cancellation Note  Patient Details Name: Brooke [name] MRN: [id number] DOB: 1934/12/27   Cancelled Treatment:    Reason Eval/Treat Not Completed: Medical issues which prohibited therapy (pt intubated post P.T. order and currently on vent with FIO2 80%. Will sign off and await new order as medically appropriate)   Ranika Mcniel B Shelita Steptoe 06/07/2021, 6:58 AM Merryl Hacker, PT Acute Rehabilitation Services Pager: [phone] Office: [phone]

## 2021-06-08 ENCOUNTER — Inpatient Hospital Stay (HOSPITAL_COMMUNITY): Payer: Medicare Other

## 2021-06-08 DIAGNOSIS — J9601 Acute respiratory failure with hypoxia: Secondary | ICD-10-CM | POA: Diagnosis not present

## 2021-06-08 DIAGNOSIS — J189 Pneumonia, unspecified organism: Secondary | ICD-10-CM | POA: Diagnosis not present

## 2021-06-08 LAB — POCT I-STAT 7, (LYTES, BLD GAS, ICA,H+H)
Acid-Base Excess: 4 mmol/L — ABNORMAL HIGH (ref 0.0–2.0)
Bicarbonate: 30.4 mmol/L — ABNORMAL HIGH (ref 20.0–28.0)
Calcium, Ion: 1.23 mmol/L (ref 1.15–1.40)
HCT: 34 % — ABNORMAL LOW (ref 36.0–46.0)
Hemoglobin: 11.6 g/dL — ABNORMAL LOW (ref 12.0–15.0)
O2 Saturation: 100 %
Patient temperature: 100.2
Potassium: 3.8 mmol/L (ref 3.5–5.1)
Sodium: 141 mmol/L (ref 135–145)
TCO2: 32 mmol/L (ref 22–32)
pCO2 arterial: 54.4 mmHg — ABNORMAL HIGH (ref 32.0–48.0)
pH, Arterial: 7.36 (ref 7.350–7.450)
pO2, Arterial: 324 mmHg — ABNORMAL HIGH (ref 83.0–108.0)

## 2021-06-08 LAB — BASIC METABOLIC PANEL
Anion gap: 6 (ref 5–15)
BUN: 22 mg/dL (ref 8–23)
CO2: 24 mmol/L (ref 22–32)
Calcium: 8.5 mg/dL — ABNORMAL LOW (ref 8.9–10.3)
Chloride: 107 mmol/L (ref 98–111)
Creatinine, Ser: 0.63 mg/dL (ref 0.44–1.00)
GFR, Estimated: >60 mL/min (ref >60)
Glucose, Bld: 99 mg/dL (ref 70–99)
Potassium: 3.7 mmol/L (ref 3.5–5.1)
Sodium: 137 mmol/L (ref 135–145)

## 2021-06-08 LAB — CBC
HCT: 36.4 % (ref 36.0–46.0)
Hemoglobin: 11.9 g/dL — ABNORMAL LOW (ref 12.0–15.0)
MCH: 31.7 pg (ref 26.0–34.0)
MCHC: 32.7 g/dL (ref 30.0–36.0)
MCV: 97.1 fL (ref 80.0–100.0)
Platelets: 211 10*3/uL (ref 150–400)
RBC: 3.75 MIL/uL — ABNORMAL LOW (ref 3.87–5.11)
RDW: 14.3 % (ref 11.5–15.5)
WBC: 9.8 10*3/uL (ref 4.0–10.5)
nRBC: 0 % (ref 0.0–0.2)

## 2021-06-08 LAB — GLUCOSE, CAPILLARY
Glucose-Capillary: 101 mg/dL — ABNORMAL HIGH (ref 70–99)
Glucose-Capillary: 101 mg/dL — ABNORMAL HIGH (ref 70–99)
Glucose-Capillary: 110 mg/dL — ABNORMAL HIGH (ref 70–99)
Glucose-Capillary: 114 mg/dL — ABNORMAL HIGH (ref 70–99)
Glucose-Capillary: 120 mg/dL — ABNORMAL HIGH (ref 70–99)
Glucose-Capillary: 97 mg/dL (ref 70–99)
Glucose-Capillary: 98 mg/dL (ref 70–99)

## 2021-06-08 LAB — PHOSPHORUS
Phosphorus: 2.9 mg/dL (ref 2.5–4.6)
Phosphorus: 3.3 mg/dL (ref 2.5–4.6)

## 2021-06-08 LAB — LEGIONELLA PNEUMOPHILA SEROGP 1 UR AG: L. pneumophila Serogp 1 Ur Ag: NEGATIVE

## 2021-06-08 LAB — MAGNESIUM
Magnesium: 1.9 mg/dL (ref 1.7–2.4)
Magnesium: 2.4 mg/dL (ref 1.7–2.4)

## 2021-06-08 IMAGING — DX DG CHEST 1V PORT
1 series · 1 of 1 positions shown · non-contrast
Comparison: Chest x-ray [DATE].

CLINICAL DATA: Feeding tube placement.

EXAM:
PORTABLE CHEST 1 VIEW

[chest ap]
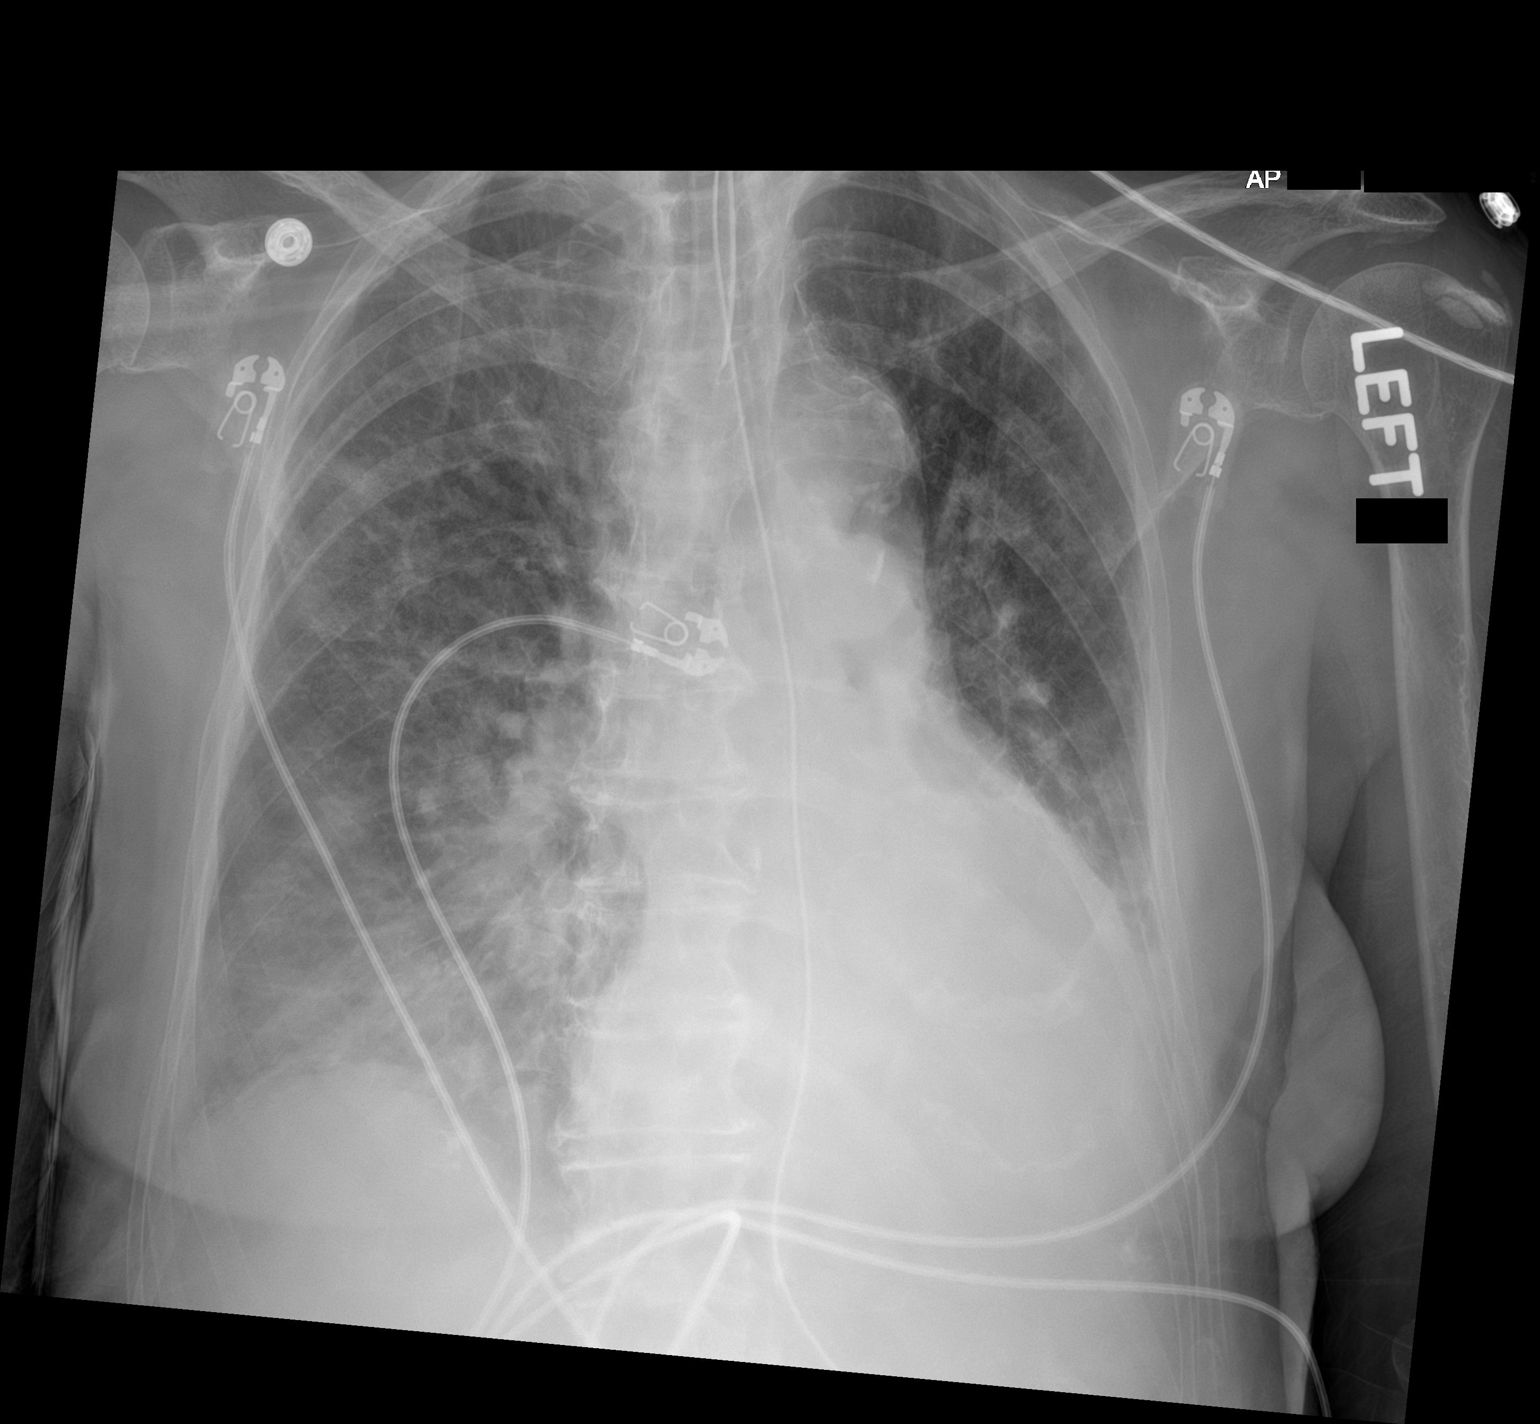

[1 of 1 positions shown; findings below may reference images not displayed]

FINDINGS: Enteric tube extends below the diaphragm. Distal tip is not included
on the image. Endotracheal tube tip is 5.5 cm above the carina.

The heart is enlarged, unchanged. Central pulmonary vascular
congestion persists. Right lower lobe airspace disease has
increased. Small left pleural effusion and left basilar opacities
are unchanged. Osseous structures are stable.
IMPRESSION: 1. Lines and tubes as above.
2. Increasing right lower lobe airspace disease. Otherwise stable
exam.

## 2021-06-08 MED ORDER — WHITE PETROLATUM EX OINT: TOPICAL_OINTMENT | CUTANEOUS | Status: DC | PRN

## 2021-06-08 MED ORDER — FENTANYL CITRATE (PF) 100 MCG/2ML IJ SOLN
INTRAMUSCULAR | Status: AC
  Administered 2021-06-08: 100 ug
  Filled 2021-06-08: qty 2

## 2021-06-08 MED ORDER — POTASSIUM CHLORIDE CRYS ER 20 MEQ PO TBCR: 40.0000 meq | EXTENDED_RELEASE_TABLET | Freq: Once | ORAL | Status: DC

## 2021-06-08 MED ORDER — ASPIRIN 81 MG PO CHEW
81.0000 mg | CHEWABLE_TABLET | Freq: Every day | ORAL | Status: DC
  Administered 2021-06-09: 81 mg via ORAL
  Filled 2021-06-08: qty 1

## 2021-06-08 MED ORDER — FENTANYL 2500MCG IN NS 250ML (10MCG/ML) PREMIX INFUSION
0.0000 ug/h | INTRAVENOUS | Status: DC
  Administered 2021-06-08: 25 ug/h via INTRAVENOUS
  Administered 2021-06-09: 50 ug/h via INTRAVENOUS
  Administered 2021-06-11: 100 ug/h via INTRAVENOUS
  Administered 2021-06-11: 150 ug/h via INTRAVENOUS
  Administered 2021-06-12 (×2): 200 ug/h via INTRAVENOUS
  Filled 2021-06-08 (×7): qty 250

## 2021-06-08 MED ORDER — DOXYCYCLINE HYCLATE 100 MG PO TABS
100.0000 mg | ORAL_TABLET | Freq: Two times a day (BID) | ORAL | Status: DC
  Administered 2021-06-08 – 2021-06-09 (×2): 100 mg via ORAL
  Filled 2021-06-08 (×2): qty 1

## 2021-06-08 MED ORDER — PROPOFOL 1000 MG/100ML IV EMUL: 5.0000 ug/kg/min | INTRAVENOUS | Status: DC

## 2021-06-08 MED ORDER — MAGNESIUM SULFATE 2 GM/50ML IV SOLN
2.0000 g | Freq: Once | INTRAVENOUS | Status: AC
  Administered 2021-06-08: 2 g via INTRAVENOUS
  Filled 2021-06-08: qty 50

## 2021-06-08 MED ORDER — PAROXETINE HCL 20 MG PO TABS
20.0000 mg | ORAL_TABLET | Freq: Every day | ORAL | Status: DC
  Administered 2021-06-09: 20 mg via ORAL
  Filled 2021-06-08: qty 1

## 2021-06-08 MED ORDER — IPRATROPIUM-ALBUTEROL 0.5-2.5 (3) MG/3ML IN SOLN
3.0000 mL | RESPIRATORY_TRACT | Status: DC | PRN
  Administered 2021-06-08: 3 mL via RESPIRATORY_TRACT
  Filled 2021-06-08: qty 3

## 2021-06-08 MED ORDER — POTASSIUM CHLORIDE 20 MEQ PO PACK
40.0000 meq | PACK | Freq: Once | ORAL | Status: AC
  Administered 2021-06-08: 40 meq
  Filled 2021-06-08: qty 2

## 2021-06-08 MED ORDER — ACETYLCYSTEINE 20 % IN SOLN
3.0000 mL | Freq: Once | RESPIRATORY_TRACT | Status: AC
  Administered 2021-06-08: 3 mL via RESPIRATORY_TRACT
  Filled 2021-06-08: qty 4

## 2021-06-08 MED ORDER — MIDAZOLAM HCL 2 MG/2ML IJ SOLN
INTRAMUSCULAR | Status: AC
  Administered 2021-06-08: 2 mg
  Filled 2021-06-08: qty 2

## 2021-06-08 MED ORDER — ETOMIDATE 2 MG/ML IV SOLN
INTRAVENOUS | Status: AC
  Administered 2021-06-08: 20 mg
  Filled 2021-06-08: qty 20

## 2021-06-08 MED ORDER — ROCURONIUM BROMIDE 10 MG/ML (PF) SYRINGE
PREFILLED_SYRINGE | INTRAVENOUS | Status: AC
  Filled 2021-06-08: qty 10

## 2021-06-08 MED ORDER — ACETAMINOPHEN 650 MG RE SUPP
650.0000 mg | Freq: Four times a day (QID) | RECTAL | Status: DC | PRN
  Filled 2021-06-08: qty 1

## 2021-06-08 MED ORDER — ACETYLCYSTEINE 10% NICU INHALATION SOLUTION: 2.0000 mL | Freq: Once | RESPIRATORY_TRACT | Status: DC

## 2021-06-08 NOTE — Progress Notes (Addendum)
eLink Physician-Brief Progress Note Patient Name: Brooke [name] DOB: 1935-02-14 MRN: [id number]   Date of Service  06/08/2021  HPI/Events of Note  Notified of hypoxia.   Pt with pneumonia, extubated earlier this morning. Pt with poor cough and has increased secretions. O2 sats in the 70s.   eICU Interventions  NT suctions prn. NRB in place with improvement of sats in the low 90s.  Start mucomyst nebulizations and NT suction through the night.  Pt is at high risk for reintubation.      Intervention Category Major Interventions: Respiratory failure - evaluation and management  Larinda Buttery 06/08/2021, 7:25 PM  8:03 PM Bedside made aware of above and plan was made to reintubate given no cough, very weak gag and desaturation into the 70s while on nonrebreather.   Pt now reintubated.   9:18 PM CXR and KUB ordered to confirm tube placement.

## 2021-06-08 NOTE — Progress Notes (Signed)
Cross covering ICU physician.   Called to room emergently for sats in 70's placed on nrb with peak at 90%. Pts mentation has been poor since extubation per RN at bedside. Unable to clear secretions.   Attempted to call family but no answer and pt is listed as full code.   Emergently reintubated. Sats did dip to 77 during intubation, pt does have small opening with ? L vocal cord poly. Copious thick secretions removed from posterior airway.   Will retry family again.   Additional Critical care time: The patient is critically ill with multiple organ systems failure and requires high complexity decision making for assessment and support, frequent evaluation and titration of therapies, application of advanced monitoring technologies and extensive interpretation of multiple databases.  Critical care time 37 additional mins. This represents my time independent of the NPs time taking care of the pt. This is excluding procedures.    Briant Sites DO [facility] 06/08/2021, 8:16 PM See Amion for pager If no response to pager, please call [phone] until 1900 After 1900 please call [facility] [phone]

## 2021-06-08 NOTE — Procedures (Signed)
Intubation Procedure Note  Brooke [name]  [id number]  Dec 30, 1934  Date:06/08/21  Time:7:54 PM   Provider Performing:Teneka Malmberg Gaynell Face    Procedure: Intubation (31500)  Indication(s) Respiratory Failure  Consent Unable to obtain consent due to emergent nature of procedure.   Anesthesia Etomidate, Versed, Fentanyl, and see MAR   Time Out Verified patient identification, verified procedure, site/side was marked, verified correct patient position, special equipment/implants available, medications/allergies/relevant history reviewed, required imaging and test results available.   Sterile Technique Usual hand hygeine, masks, and gloves were used   Procedure Description Patient positioned in bed supine.  Sedation given as noted above.  Patient was intubated with endotracheal tube using Glidescope.  View was Grade 1 full glottis .  Number of attempts was 1.  Colorimetric CO2 detector was consistent with tracheal placement.   Complications/Tolerance None; patient tolerated the procedure well. Chest X-ray is ordered to verify placement.   EBL none   Specimen(s) None

## 2021-06-08 NOTE — Progress Notes (Addendum)
NAME:  Brooke [name], MRN:  [id number], DOB:  04/11/1935, LOS: 3 ADMISSION DATE:  05/25/2021, CONSULTATION DATE:  11/28 REFERRING MD:  Darrick Meigs , CHIEF COMPLAINT:  respiratory failure    History of Present Illness:  This is an 86 year old female patient with past medical history as per below.  Presented to the emergency room chronic 11/27 with chief complaint of cough, shortness of breath, and worsening weakness.  Apparently had fallen about 1 month prior to presentation since that time has had progressive weakness, feels like at times her legs just "give out" she is required help with activities of daily living at home by her husband.  Over the last 4-5 days had developed a cough, said she felt like she was "warm" on day of admission had developed worsening and progressive shortness of breath.  EMS was called and on arrival room air saturations 64% in ER she was found to have a temperature of 101.3 chest x-ray showed right lower lobe airspace disease as well as left-sided pleural effusion she was admitted with a working diagnosis of pneumonia and possible heart failure therapeutic interventions included supplemental oxygen, IV ceftriaxone and doxycycline, gentle IV hydration and plan to admit.  Over the following 24 hours her respiratory failure continued to worsen going from supplemental oxygen via nasal cannula then to high flow nasal cannula then to mix of both high flow and nonrebreather mask and unable to bring saturation greater than 80%.  Because of this pulmonary was asked to evaluate  Pertinent  Medical History  Afib on apixaban CAD, HTN, HLD, Depression Recent lower back injury being followed by orthopedics (awaiting lumbar MRI) Osteoarthritis  Significant Hospital Events: Including procedures, antibiotic start and stop dates in addition to other pertinent events   11/27 admitted w/ working dx of PNA ceftriaxone and doxy started. Resp viral panel neg.  11/28 progressive resp failure no  improvement w/ high flow oxygen and NRB. Intubated. 11/29: weaning Vent support, Cont.  abx, diuresis. Still bradycardic ECHO hyperdynamic w/ EF [id number] gd 2 diastolic dysfxn. No WM abnormality. RA size mildly elevated.  11/30 WBC normalized. Passing SBT. Sedation stopped. Extubated. DOAC placed on hold. Foley d/c'd.  Interim History / Subjective:  On SBT looks good  Objective   Blood pressure (Abnormal) 109/54, pulse 87, temperature 99.9 F (37.7 C), temperature source Bladder, resp. rate (Abnormal) 24, height 5\' 6"  (1.676 m), weight 72.1 kg, SpO2 99 %.    Vent Mode: PRVC FiO2 (%):  [40 %-50 %] 40 % Set Rate:  [21 bmp-24 bmp] 21 bmp Vt Set:  [350 mL] 350 mL PEEP:  [8 cmH20-10 cmH20] 8 cmH20 Plateau Pressure:  [16 cmH20-20 cmH20] 18 cmH20   Intake/Output Summary (Last 24 hours) at 06/08/2021 0733 Last data filed at 06/08/2021 0700 Gross per 24 hour  Intake 1649.12 ml  Output 1410 ml  Net 239.12 ml   Filed Weights   06/06/21 2345 06/07/21 0426 06/08/21 0359  Weight: 71.1 kg 71.1 kg 72.1 kg    Examination: General this is a 86 year old female she is awake. Following commands HENT NCAT no JVD MMM OETT present. OGT in place Pulm clear. Dec bases. VTs 500s RR 17 on PS of 5 Card Regular irreg  Abd soft Ext warm  Neuro moving all ext Gu clear yellow   Resolved Hospital Problem list     Assessment & Plan:   Acute Hypoxic respiratory Failure 2/2 CAP  Pleural effusions, Pulmonary Edema Pcxr w/ on-going and serial improvement. The left  is essentially clear the RUL has improved c/w yesterday  Bedside POCUS demonstrating mod sized right Pleural effusion 11/30 Now on SBT Plan: Complete SBT; anticipate extubation later today  RASS goal 0 VAP bundle  Day 3 of 7 abx. Currently on doxy and ceftriaxone. Awaiting U-legionella;  Hold DOAC this afternoon and in AM tomorrow. Planning of diagnostic and therapeutic right sided thora   Sepsis from CAP w/ septic shock vs drug induced  hypotension  - Afebrile - feel like hypotension is more sedation related at this point.  Plan: Dc IV sedating gtts Keep euvolemic Wean NE for MAP > 65 Abx addressed above   Hx. Afib currently SB w/ Acute Diastolic HF echo noted above Was fairly bradycardic. This is better today  Plan: Cont DOAC Can add back amiodarone once extubated and taking diet Hold off on CCB as still pressor dependent  K goal > 4/Mg goal > 2 Cont tele  Fluid and Electrolyte Abnormalities Plan: K and Mg boarderline s/p diuresis yesterday will replace   Hx HLD  Plan Statin   Glaucoma Plan: Eye gtts  Back Pain Plan: PRN meds Stool softener  Will see how her pain and weakness is... if still fairly significant may go ahead and get MRI as she was supposed to get this anyway   Hx Depression Plan: Paxil   Best Practice (right click and "Reselect all SmartList Selections" daily)   Diet/type: tubefeeds DVT prophylaxis: DOAC GI prophylaxis: PPI Lines: N/A Foley:  N/A Code Status:  full code Last date of multidisciplinary goals of care discussion [pending]  My cct 32 min   Simonne Martinet ACNP-BC [facility] Pulmonary/Critical Care Pager # [phone] OR # [phone] if no answer

## 2021-06-08 NOTE — Procedures (Signed)
Extubation Procedure Note  Patient Details:   Name: Jayma Volpi DOB: Mar 14, 1935 MRN: [id number]   Airway Documentation:    Vent end date: 06/08/21 Vent end time: 0823   Evaluation  O2 sats: stable throughout Complications: No apparent complications Patient did tolerate procedure well. Bilateral Breath Sounds: Clear, Diminished   Yes Pt was successfully extubated with no complications. No cuffleak was heard and MD was made aware, No signs of stridor at this time. Pt is on 4L [place] with a 93% sat. Pt is able to speak and cough. RT will continue to monitor.  Jolayne Panther 06/08/2021, 8:31 AM

## 2021-06-09 DIAGNOSIS — J189 Pneumonia, unspecified organism: Secondary | ICD-10-CM | POA: Diagnosis not present

## 2021-06-09 LAB — COMPREHENSIVE METABOLIC PANEL
ALT: 33 U/L (ref 0–44)
AST: 33 U/L (ref 15–41)
Albumin: 2.3 g/dL — ABNORMAL LOW (ref 3.5–5.0)
Alkaline Phosphatase: 89 U/L (ref 38–126)
Anion gap: 10 (ref 5–15)
BUN: 13 mg/dL (ref 8–23)
CO2: 26 mmol/L (ref 22–32)
Calcium: 8.8 mg/dL — ABNORMAL LOW (ref 8.9–10.3)
Chloride: 104 mmol/L (ref 98–111)
Creatinine, Ser: 0.51 mg/dL (ref 0.44–1.00)
GFR, Estimated: >60 mL/min (ref >60)
Glucose, Bld: 92 mg/dL (ref 70–99)
Potassium: 4 mmol/L (ref 3.5–5.1)
Sodium: 140 mmol/L (ref 135–145)
Total Bilirubin: 1.2 mg/dL (ref 0.3–1.2)
Total Protein: 5.5 g/dL — ABNORMAL LOW (ref 6.5–8.1)

## 2021-06-09 LAB — CBC
HCT: 38.2 % (ref 36.0–46.0)
Hemoglobin: 12.3 g/dL (ref 12.0–15.0)
MCH: 31.4 pg (ref 26.0–34.0)
MCHC: 32.2 g/dL (ref 30.0–36.0)
MCV: 97.4 fL (ref 80.0–100.0)
Platelets: 212 10*3/uL (ref 150–400)
RBC: 3.92 MIL/uL (ref 3.87–5.11)
RDW: 14.5 % (ref 11.5–15.5)
WBC: 11 10*3/uL — ABNORMAL HIGH (ref 4.0–10.5)
nRBC: 0 % (ref 0.0–0.2)

## 2021-06-09 LAB — BASIC METABOLIC PANEL
Anion gap: 9 (ref 5–15)
BUN: 15 mg/dL (ref 8–23)
CO2: 28 mmol/L (ref 22–32)
Calcium: 8.6 mg/dL — ABNORMAL LOW (ref 8.9–10.3)
Chloride: 104 mmol/L (ref 98–111)
Creatinine, Ser: 0.65 mg/dL (ref 0.44–1.00)
GFR, Estimated: >60 mL/min (ref >60)
Glucose, Bld: 111 mg/dL — ABNORMAL HIGH (ref 70–99)
Potassium: 3.8 mmol/L (ref 3.5–5.1)
Sodium: 141 mmol/L (ref 135–145)

## 2021-06-09 LAB — GLUCOSE, CAPILLARY
Glucose-Capillary: 89 mg/dL (ref 70–99)
Glucose-Capillary: 84 mg/dL (ref 70–99)
Glucose-Capillary: 95 mg/dL (ref 70–99)
Glucose-Capillary: 112 mg/dL — ABNORMAL HIGH (ref 70–99)
Glucose-Capillary: 133 mg/dL — ABNORMAL HIGH (ref 70–99)
Glucose-Capillary: 134 mg/dL — ABNORMAL HIGH (ref 70–99)

## 2021-06-09 LAB — MAGNESIUM
Magnesium: 2.2 mg/dL (ref 1.7–2.4)
Magnesium: 2.1 mg/dL (ref 1.7–2.4)

## 2021-06-09 LAB — TRIGLYCERIDES: Triglycerides: 80 mg/dL (ref <150)

## 2021-06-09 LAB — PHOSPHORUS
Phosphorus: 3.4 mg/dL (ref 2.5–4.6)
Phosphorus: 3.2 mg/dL (ref 2.5–4.6)

## 2021-06-09 MED ORDER — POLYETHYLENE GLYCOL 3350 17 G PO PACK
17.0000 g | PACK | Freq: Two times a day (BID) | ORAL | Status: DC
  Administered 2021-06-09 – 2021-06-13 (×9): 17 g
  Filled 2021-06-09 (×9): qty 1

## 2021-06-09 MED ORDER — VITAL AF 1.2 CAL PO LIQD
1000.0000 mL | ORAL | Status: DC
  Administered 2021-06-09 – 2021-06-13 (×5): 1000 mL

## 2021-06-09 MED ORDER — FAMOTIDINE 20 MG PO TABS
20.0000 mg | ORAL_TABLET | Freq: Two times a day (BID) | ORAL | Status: DC
  Administered 2021-06-09 – 2021-06-13 (×9): 20 mg
  Filled 2021-06-09 (×9): qty 1

## 2021-06-09 MED ORDER — SENNA 8.6 MG PO TABS
1.0000 | ORAL_TABLET | Freq: Every day | ORAL | Status: DC
  Administered 2021-06-09 – 2021-06-13 (×5): 8.6 mg
  Filled 2021-06-09 (×5): qty 1

## 2021-06-09 MED ORDER — DOXYCYCLINE HYCLATE 100 MG PO TABS
100.0000 mg | ORAL_TABLET | Freq: Two times a day (BID) | ORAL | Status: AC
  Administered 2021-06-09: 100 mg
  Filled 2021-06-09: qty 1

## 2021-06-09 MED ORDER — SODIUM CHLORIDE 3 % IN NEBU
4.0000 mL | INHALATION_SOLUTION | Freq: Three times a day (TID) | RESPIRATORY_TRACT | Status: DC
  Administered 2021-06-09: 4 mL via RESPIRATORY_TRACT
  Filled 2021-06-09: qty 4

## 2021-06-09 MED ORDER — FUROSEMIDE 10 MG/ML IJ SOLN
40.0000 mg | Freq: Once | INTRAMUSCULAR | Status: AC
  Administered 2021-06-09: 40 mg via INTRAVENOUS
  Filled 2021-06-09: qty 4

## 2021-06-09 MED ORDER — PAROXETINE HCL 20 MG PO TABS
20.0000 mg | ORAL_TABLET | Freq: Every day | ORAL | Status: DC
  Administered 2021-06-10 – 2021-06-13 (×4): 20 mg
  Filled 2021-06-09 (×4): qty 1

## 2021-06-09 MED ORDER — ASPIRIN 81 MG PO CHEW
81.0000 mg | CHEWABLE_TABLET | Freq: Every day | ORAL | Status: DC
Start: 2021-06-10 — End: 2021-06-13
  Administered 2021-06-10 – 2021-06-13 (×4): 81 mg
  Filled 2021-06-09 (×4): qty 1

## 2021-06-09 MED ORDER — ATORVASTATIN CALCIUM 40 MG PO TABS
40.0000 mg | ORAL_TABLET | Freq: Every day | ORAL | Status: DC
Start: 2021-06-10 — End: 2021-06-13
  Administered 2021-06-10 – 2021-06-13 (×4): 40 mg
  Filled 2021-06-09 (×4): qty 1

## 2021-06-09 MED ORDER — SODIUM CHLORIDE 3 % IN NEBU
4.0000 mL | INHALATION_SOLUTION | Freq: Three times a day (TID) | RESPIRATORY_TRACT | Status: DC
  Administered 2021-06-09 – 2021-06-11 (×4): 4 mL via RESPIRATORY_TRACT
  Filled 2021-06-09 (×4): qty 4

## 2021-06-09 NOTE — Progress Notes (Addendum)
SLP Cancellation Note  Patient Details Name: Brooke [name] MRN: [id number] DOB: 05-Sep-1934   Cancelled treatment:        Attempted to see pt for clinical swallow evaluation.  Pt was extubated 11/30 with subsequent reintubation.  Pt is not medically ready for POs at this time.  SLP will await new orders when appropriate.   Kerrie Pleasure, MA, CCC-SLP Acute Rehabilitation Services Office: [phone]  06/09/2021, 9:58 AM

## 2021-06-09 NOTE — Progress Notes (Signed)
NAME:  Brooke [name], MRN:  [id number], DOB:  Dec 03, 1934, LOS: 4 ADMISSION DATE:  05/25/2021, CONSULTATION DATE:  11/28 REFERRING MD:  Darrick Meigs , CHIEF COMPLAINT:  respiratory failure    History of Present Illness:  This is an 86 year old female patient with past medical history as per below.  Presented to the emergency room chronic 11/27 with chief complaint of cough, shortness of breath, and worsening weakness.  Apparently had fallen about 1 month prior to presentation since that time has had progressive weakness, feels like at times her legs just "give out" she is required help with activities of daily living at home by her husband.  Over the last 4-5 days had developed a cough, said she felt like she was "warm" on day of admission had developed worsening and progressive shortness of breath.  EMS was called and on arrival room air saturations 64% in ER she was found to have a temperature of 101.3 chest x-ray showed right lower lobe airspace disease as well as left-sided pleural effusion she was admitted with a working diagnosis of pneumonia and possible heart failure therapeutic interventions included supplemental oxygen, IV ceftriaxone and doxycycline, gentle IV hydration and plan to admit.  Over the following 24 hours her respiratory failure continued to worsen going from supplemental oxygen via nasal cannula then to high flow nasal cannula then to mix of both high flow and nonrebreather mask and unable to bring saturation greater than 80%.  Because of this pulmonary was asked to evaluate  Pertinent  Medical History  Afib on apixaban CAD, HTN, HLD, Depression Recent lower back injury being followed by orthopedics (awaiting lumbar MRI) Osteoarthritis  Significant Hospital Events: Including procedures, antibiotic start and stop dates in addition to other pertinent events   11/27 admitted w/ working dx of PNA ceftriaxone and doxy started. Resp viral panel neg.  11/28 progressive resp failure no  improvement w/ high flow oxygen and NRB. Intubated. 11/29: weaning Vent support, Cont.  abx, diuresis. Still bradycardic ECHO hyperdynamic w/ EF [id number] gd 2 diastolic dysfxn. No WM abnormality. RA size mildly elevated.  11/30 WBC normalized. Passing SBT. Sedation stopped. Extubated. DOAC placed on hold. Foley d/c'd. 12/1 reintubated overnight, minimal vent supports, alert on the vent  Interim History / Subjective:  Reintubated overnight for hypoxemia, did not appear to be taking airway, poor cough.  This morning she is alert, communicative.  On minimal vent settings  Objective   Blood pressure (!) 104/55, pulse 100, temperature 100.1 F (37.8 C), temperature source Axillary, resp. rate 11, height 5\' 6"  (1.676 m), weight 66.6 kg, SpO2 97 %.    Vent Mode: PSV;CPAP FiO2 (%):  [40 %] 40 % Set Rate:  [21 bmp] 21 bmp Vt Set:  [350 mL] 350 mL PEEP:  [5 cmH20-8 cmH20] 5 cmH20 Pressure Support:  [8 cmH20] 8 cmH20 Plateau Pressure:  [9 cmH20-15 cmH20] 9 cmH20   Intake/Output Summary (Last 24 hours) at 06/09/2021 1651 Last data filed at 06/09/2021 1301 Gross per 24 hour  Intake 353.42 ml  Output 700 ml  Net -346.58 ml    Filed Weights   06/07/21 0426 06/08/21 0359 06/09/21 0500  Weight: 71.1 kg 72.1 kg 66.6 kg    Examination: General this is a 86 year old female she is awake. Following commands HENT NCAT no JVD MMM OETT present. OGT in place Pulm clear. Dec bases.  Card Regular irreg  Abd soft Ext warm  Neuro moving all ext, communicative Gu clear yellow   [facility]  Problem list     Assessment & Plan:   Acute Hypoxic respiratory Failure 2/2 CAP  Pleural effusions, Pulmonary Edema Plan: PRVC, PSV as tolerated SBT in a.m. 12/2 RASS goal 0 VAP bundle  Day 4 of 7 abx. Currently on doxy and ceftriaxone. Awaiting U-legionella;  Hold DOAC consideration for thoracentesis if pleural effusion still present after ongoing diuresis Lasix redosed today  Sepsis from CAP w/ septic  shock vs drug induced hypotension (improved) Plan: Okay to diurese off vasopressors Wean NE for MAP > 65 Abx addressed above   Hx. Afib currently SB w/ Acute Diastolic HF echo noted above Heart rates better, mainly 90s to low 100s Plan: Hold Eliquis as above Can add back metoprolol if RVR develops K goal > 4/Mg goal > 2 Cont tele  Hx HLD  Plan Statin   Hx Depression Plan: Paxil   Best Practice (right click and "Reselect all SmartList Selections" daily)   Diet/type: tubefeeds DVT prophylaxis: SCD GI prophylaxis: H2B Lines: N/A Foley:  N/A Code Status:  full code Last date of multidisciplinary goals of care discussion [pending]  CRITICAL CARE Performed by: Karren Burly   Total critical care time: 35 minutes  Critical care time was exclusive of separately billable procedures and treating other patients.  Critical care was necessary to treat or prevent imminent or life-threatening deterioration.  Critical care was time spent personally by me on the following activities: development of treatment plan with patient and/or surrogate as well as nursing, discussions with consultants, evaluation of patient's response to treatment, examination of patient, obtaining history from patient or surrogate, ordering and performing treatments and interventions, ordering and review of laboratory studies, ordering and review of radiographic studies, pulse oximetry and re-evaluation of patient's condition.   Karren Burly, MD [facility] Pulmonary/Critical Care See Amion for contact info

## 2021-06-09 NOTE — Progress Notes (Signed)
PT Cancellation Note  Patient Details Name: Brooke [name] MRN: [id number] DOB: 08/12/1934   Cancelled Treatment:    Reason Eval/Treat Not Completed: Patient not medically ready (Ordered post extubation with pt then reintubated and currently sedated)   Brooke [name] 06/09/2021, 7:36 AM Merryl Hacker, PT Acute Rehabilitation Services Pager: [phone] Office: [phone]

## 2021-06-09 NOTE — Progress Notes (Signed)
Nutrition Follow-up  DOCUMENTATION CODES:   Not applicable  INTERVENTION:   Initiate tube feeds via OG tube: - Vital AF 1.2 @ 55 ml/hr (1320 ml/day)   Tube feeding regimen provides 1584 kcal, 99 grams of protein, and 1071 ml of H2O.  NUTRITION DIAGNOSIS:   Inadequate oral intake related to inability to eat as evidenced by NPO status.  Ongoing, being addressed via TF  GOAL:   Patient will meet greater than or equal to 90% of their needs  Met via TF  MONITOR:   Vent status, Labs, Weight trends, TF tolerance, Skin, I & O's  REASON FOR ASSESSMENT:   Ventilator, Consult Enteral/tube feeding initiation and management  ASSESSMENT:   86 year old female who presented to the ED on 11/27 with back pain and was found to be in respiratory distress. PMH of atrial fibrillation, HTN, HLD, depression. Pt admitted with multifocal PNA.  11/28 - intubated 11/30 - extubated, later reintubated  Discussed pt with RN and during ICU rounds.  Consult received to resume enteral nutrition now that pt has been reintubated. Per abdominal x-ray from yesterday, pt with OG tube tip in distal stomach. Discussed plan with RN.  Admit weight: 71.1 kg Current weight: 66.6 kg  Patient is currently intubated on ventilator support MV: 7.6 L/min Temp (24hrs), Avg:100.5 F (38.1 C), Min:98.8 F (37.1 C), Max:101.1 F (38.4 C)  Drips: Fentanyl  Medications reviewed and include: pepcid, IV lasix 40 mg once, miralax, senna, IV abx  Labs reviewed. CBG's: 84-120 x 24 hours  UOP: 1396 x 24 hours I/O's: -1.4 L since admit  NUTRITION - FOCUSED PHYSICAL EXAM:  Flowsheet Row Most Recent Value  Orbital Region Mild depletion  Upper Arm Region No depletion  Thoracic and Lumbar Region No depletion  Buccal Region Unable to assess  Temple Region Mild depletion  Clavicle Bone Region Mild depletion  Clavicle and Acromion Bone Region Mild depletion  Scapular Bone Region Unable to assess  Dorsal Hand  Mild depletion  Patellar Region Mild depletion  Anterior Thigh Region Mild depletion  Posterior Calf Region Mild depletion  Edema (RD Assessment) Mild  Hair Reviewed  Eyes Reviewed  Mouth Unable to assess  Skin Reviewed  Nails Reviewed       Diet Order:   Diet Order     None       EDUCATION NEEDS:   No education needs have been identified at this time  Skin:  Skin Assessment: Skin Integrity Issues: Stage I: R back, L back  Last BM:  no documented BM  Height:   Ht Readings from Last 1 Encounters:  05/10/2021 _0  (1.676 m)    Weight:   Wt Readings from Last 1 Encounters:  06/09/21 66.6 kg    BMI:  Body mass index is 23.7 kg/m.  Estimated Nutritional Needs:   Kcal:  1500-1700  Protein:  85-100 grams  Fluid:  1.5-1.7 L    Gustavus Bryant, MS, RD, LDN Inpatient Clinical Dietitian Please see AMiON for contact information.

## 2021-06-09 DEATH — deceased

## 2021-06-10 ENCOUNTER — Ambulatory Visit: Payer: Medicare Other | Admitting: Internal Medicine

## 2021-06-10 ENCOUNTER — Inpatient Hospital Stay (HOSPITAL_COMMUNITY): Payer: Medicare Other

## 2021-06-10 DIAGNOSIS — J189 Pneumonia, unspecified organism: Secondary | ICD-10-CM | POA: Diagnosis not present

## 2021-06-10 LAB — CBC WITH DIFFERENTIAL/PLATELET
Abs Immature Granulocytes: 0.07 10*3/uL (ref 0.00–0.07)
Basophils Absolute: 0 10*3/uL (ref 0.0–0.1)
Basophils Relative: 0 %
Eosinophils Absolute: 0.1 10*3/uL (ref 0.0–0.5)
Eosinophils Relative: 1 %
HCT: 36.9 % (ref 36.0–46.0)
Hemoglobin: 12.1 g/dL (ref 12.0–15.0)
Immature Granulocytes: 1 %
Lymphocytes Relative: 9 %
Lymphs Abs: 1.1 10*3/uL (ref 0.7–4.0)
MCH: 32.3 pg (ref 26.0–34.0)
MCHC: 32.8 g/dL (ref 30.0–36.0)
MCV: 98.4 fL (ref 80.0–100.0)
Monocytes Absolute: 0.9 10*3/uL (ref 0.1–1.0)
Monocytes Relative: 7 %
Neutro Abs: 10.2 10*3/uL — ABNORMAL HIGH (ref 1.7–7.7)
Neutrophils Relative %: 82 %
Platelets: 239 10*3/uL (ref 150–400)
RBC: 3.75 MIL/uL — ABNORMAL LOW (ref 3.87–5.11)
RDW: 14.5 % (ref 11.5–15.5)
WBC: 12.4 10*3/uL — ABNORMAL HIGH (ref 4.0–10.5)
nRBC: 0 % (ref 0.0–0.2)

## 2021-06-10 LAB — CULTURE, BLOOD (ROUTINE X 2)
Culture: NO GROWTH
Culture: NO GROWTH

## 2021-06-10 LAB — BASIC METABOLIC PANEL
Anion gap: 5 (ref 5–15)
BUN: 25 mg/dL — ABNORMAL HIGH (ref 8–23)
CO2: 28 mmol/L (ref 22–32)
Calcium: 8.7 mg/dL — ABNORMAL LOW (ref 8.9–10.3)
Chloride: 106 mmol/L (ref 98–111)
Creatinine, Ser: 0.7 mg/dL (ref 0.44–1.00)
GFR, Estimated: >60 mL/min (ref >60)
Glucose, Bld: 174 mg/dL — ABNORMAL HIGH (ref 70–99)
Potassium: 3.6 mmol/L (ref 3.5–5.1)
Sodium: 139 mmol/L (ref 135–145)

## 2021-06-10 LAB — MAGNESIUM
Magnesium: 2.2 mg/dL (ref 1.7–2.4)
Magnesium: 2.3 mg/dL (ref 1.7–2.4)

## 2021-06-10 LAB — PHOSPHORUS
Phosphorus: 2.6 mg/dL (ref 2.5–4.6)
Phosphorus: 3 mg/dL (ref 2.5–4.6)

## 2021-06-10 LAB — GLUCOSE, CAPILLARY
Glucose-Capillary: 149 mg/dL — ABNORMAL HIGH (ref 70–99)
Glucose-Capillary: 148 mg/dL — ABNORMAL HIGH (ref 70–99)
Glucose-Capillary: 144 mg/dL — ABNORMAL HIGH (ref 70–99)
Glucose-Capillary: 143 mg/dL — ABNORMAL HIGH (ref 70–99)
Glucose-Capillary: 172 mg/dL — ABNORMAL HIGH (ref 70–99)
Glucose-Capillary: 158 mg/dL — ABNORMAL HIGH (ref 70–99)

## 2021-06-10 IMAGING — MR MR LUMBAR SPINE W/O CM
4 of 5 series · 19 of 48 positions shown · non-contrast
Comparison: Lumbar spine radiographs [DATE]

CLINICAL DATA: Upper and lower extremity weakness.

EXAM:
MRI LUMBAR SPINE WITHOUT CONTRAST
TECHNIQUE: Multiplanar, multisequence MR imaging of the lumbar spine was
performed. No intravenous contrast was administered.

[Series 19: T2 · sagittal · 4.0mm · 0.55mm/px · 5 of 15 slices shown (1 of 2)]
[im 1/15]
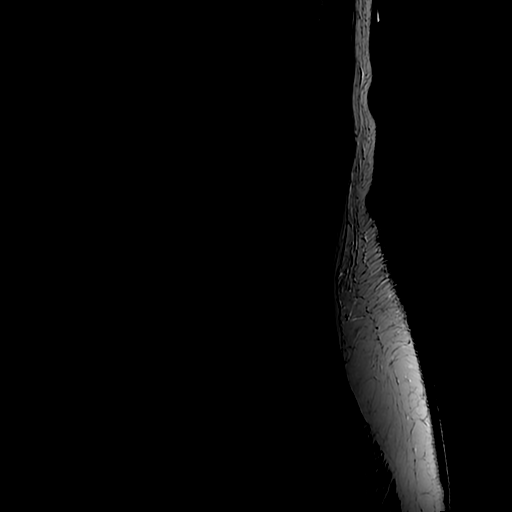
[im 4/15]
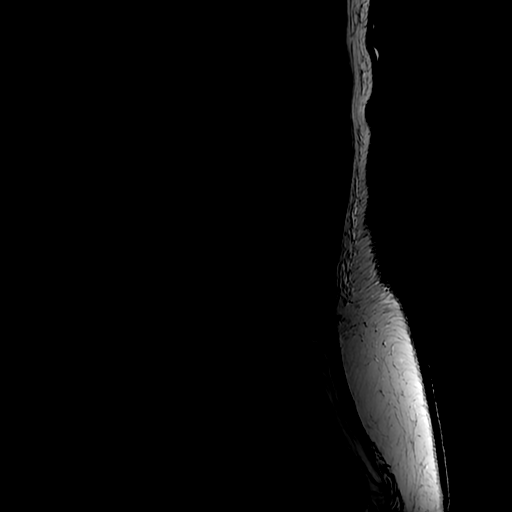
[im 8/15]
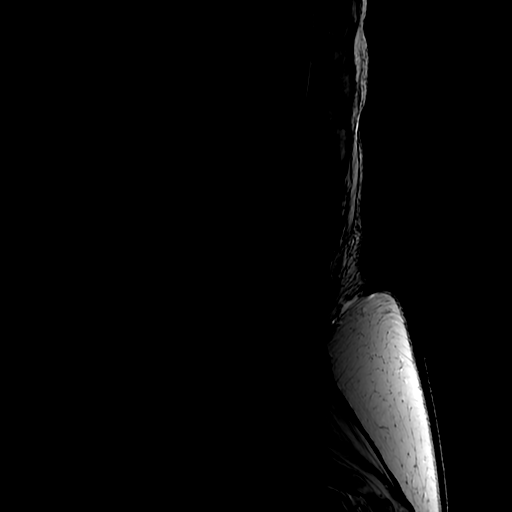
[im 11/15]
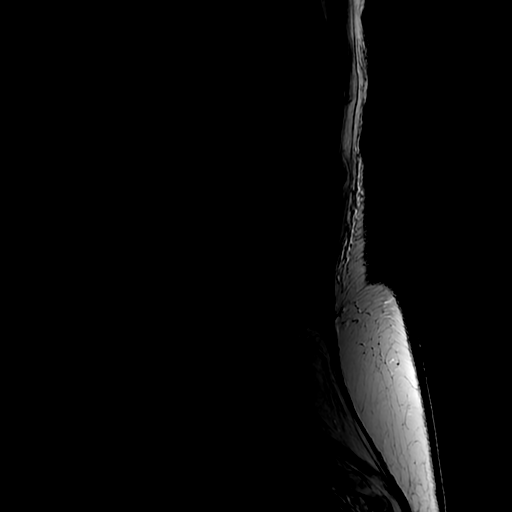
[im 15/15]
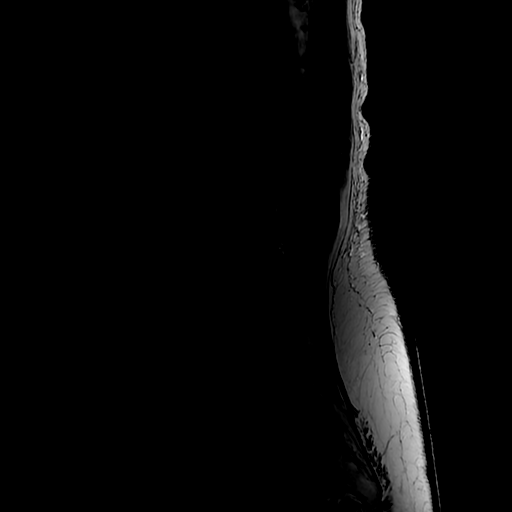

[Series 21: T1 · sagittal · 4.0mm · 0.55mm/px · 3 of 15 slices shown (1 of 2)]
[im 3/15]
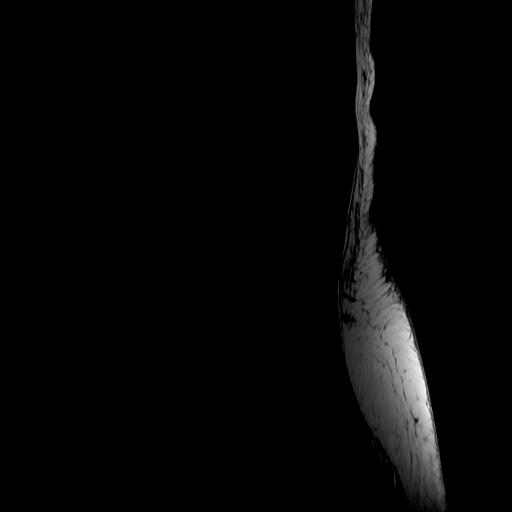
[im 9/15]
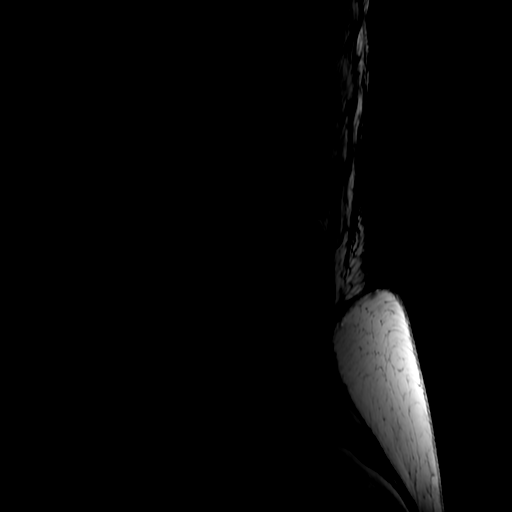
[im 15/15]
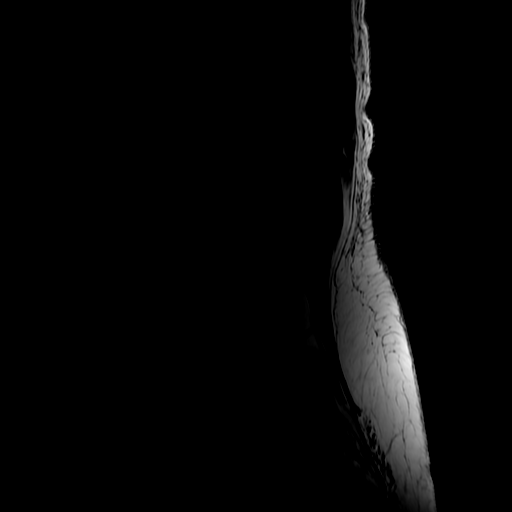

[Series 22: T2 · axial · 4.0mm · 0.39mm/px · z∈[-534,-359]mm · 8 of 42 slices shown (2 of 2)]
[im 3/42]
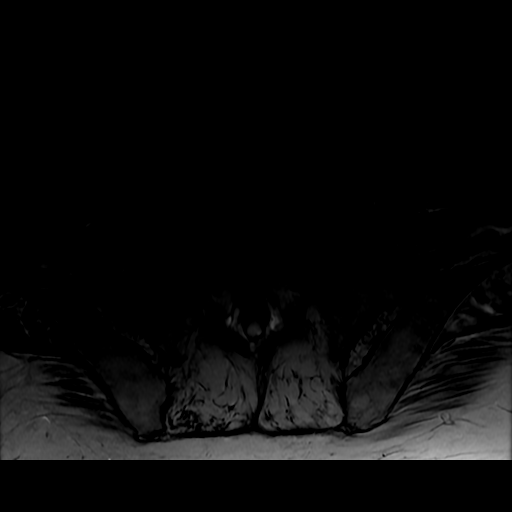
[im 6/42]
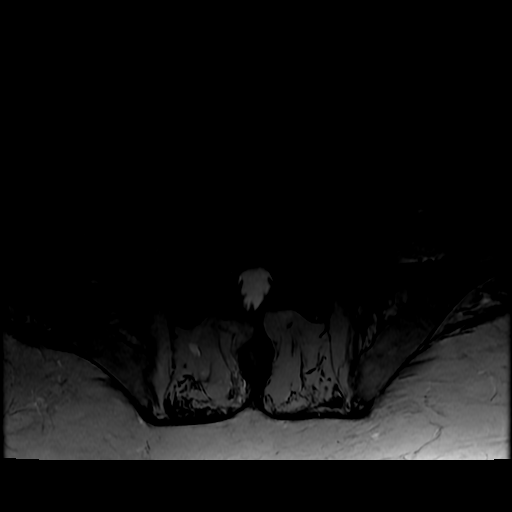
[im 9/42]
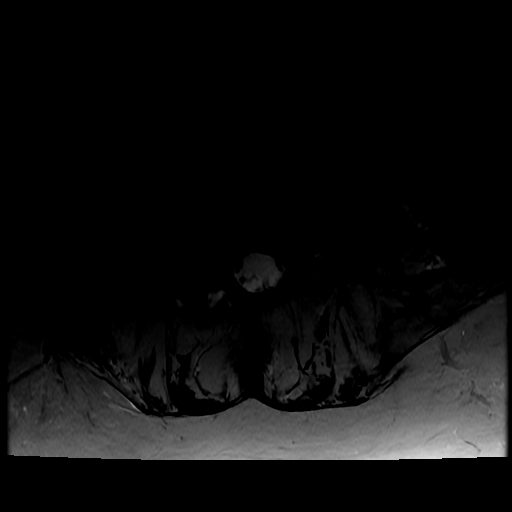
[im 14/42]
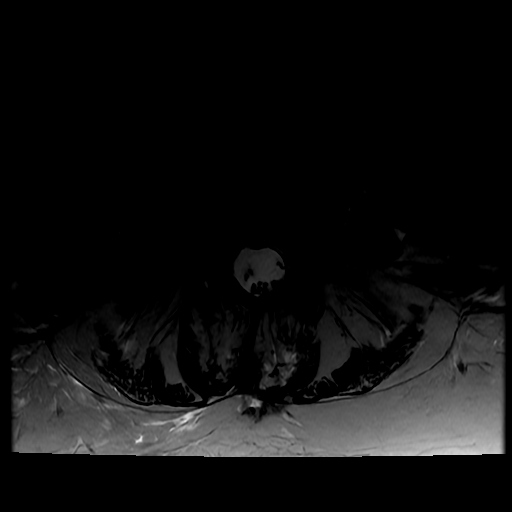
[im 20/42]
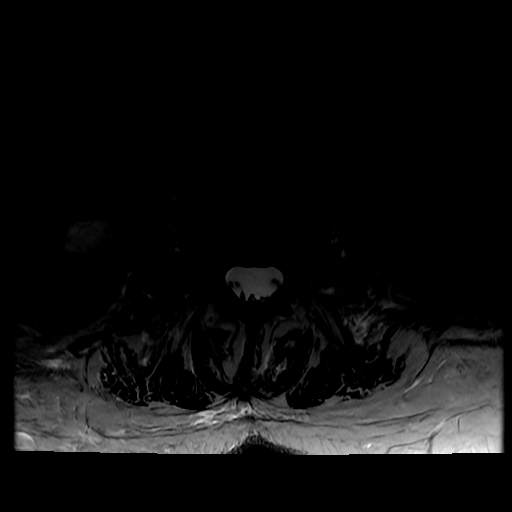
[im 22/42]
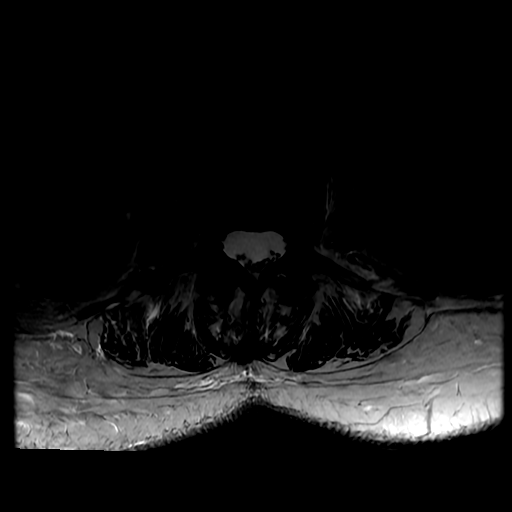
[im 25/42]
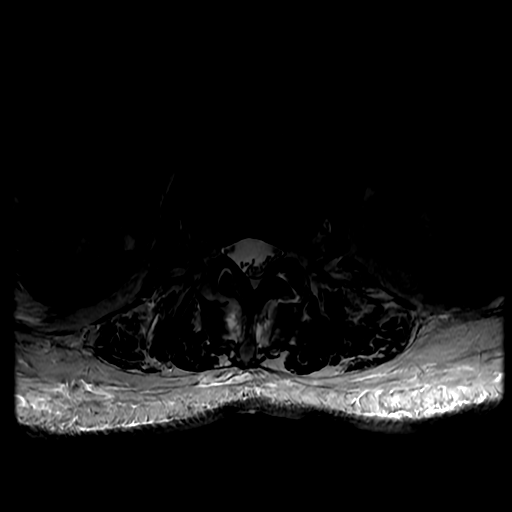
[im 36/42]
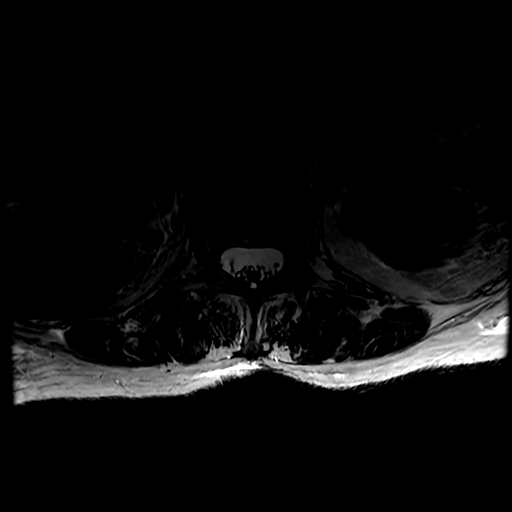

[Series 23: T1 · axial · 4.0mm · 0.39mm/px · z∈[-520,-359]mm · 3 of 42 slices shown (2 of 2)]
[im 6/42]
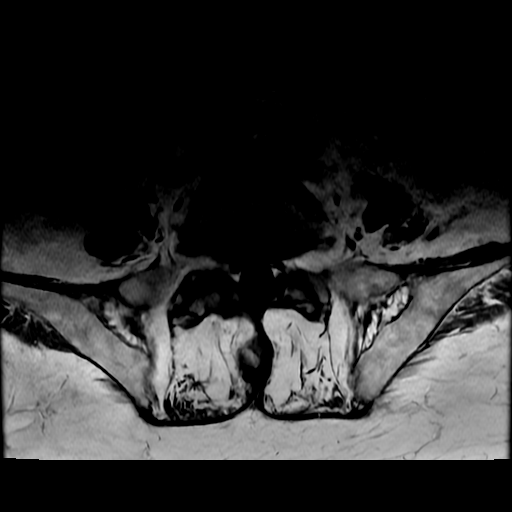
[im 22/42]
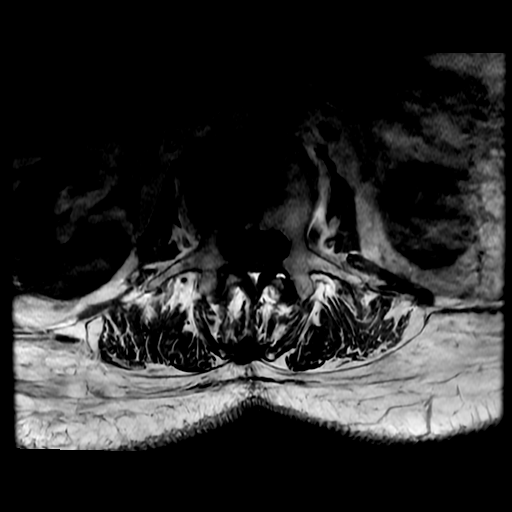
[im 36/42]
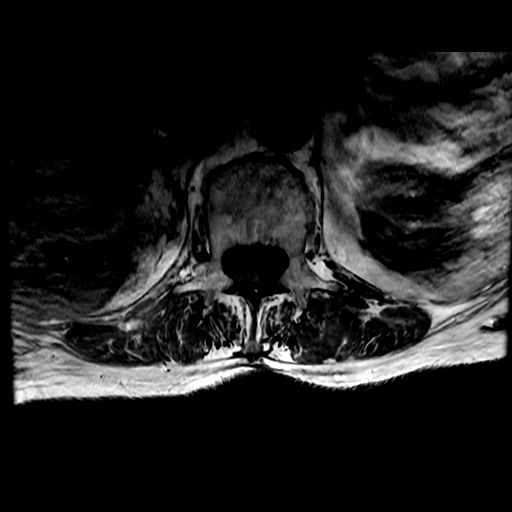

[19 of 48 positions shown; findings below may reference images not displayed]

FINDINGS: Segmentation:  5 lumbar vertebra.

Alignment:  Mild retrolisthesis L3-4 and L4-5

Vertebrae: Mild superior endplate fracture of L3 with bone marrow
edema. This appears acute and is not seen on the recent study. No
other fracture or mass lesion.

Conus medullaris and cauda equina: Conus extends to the L1-2 level.
Conus and cauda equina appear normal.

Paraspinal and other soft tissues: Negative for paraspinous mass,
adenopathy, or fluid collection.

Disc levels:

T12-L1: Mild disc degeneration with disc bulging and spurring.
Negative for stenosis.

L1-2: Negative

L2-3: Mild disc bulging and mild facet degeneration. Shallow
left-sided disc protrusion. Mild subarticular stenosis on the left.

L3-4: Disc degeneration with diffuse disc bulging and mild facet
degeneration. Negative for stenosis.

L4-5: Left laminectomy. Advanced disc degeneration with disc space
narrowing and diffuse endplate spurring. Moderate subarticular and
foraminal stenosis bilaterally. Spinal canal decompressed.
Right-sided facet degeneration. Left facetectomy

L5-S1: Mild disc degeneration and moderate facet degeneration. Mild
subarticular stenosis bilaterally.
IMPRESSION: Mild superior endplate fracture of L3 which appears acute with bone
marrow edema.

Multilevel lumbar degenerative change. Spinal canal adequately
patent throughout the lumbar spine. Moderate subarticular and
foraminal stenosis bilaterally L4-5.

## 2021-06-10 IMAGING — MR MR THORACIC SPINE W/O CM
4 of 6 series · 19 of 48 positions shown · non-contrast
Comparison: None.

CLINICAL DATA: Upper and lower extremity weakness

EXAM:
MRI THORACIC SPINE WITHOUT CONTRAST
TECHNIQUE: Multiplanar, multisequence MR imaging of the thoracic spine was
performed. No intravenous contrast was administered.

[Series 9: T1 · sagittal · 3.0mm · 0.90mm/px · 3 of 12 slices shown (1 of 2)]
[im 1/12]
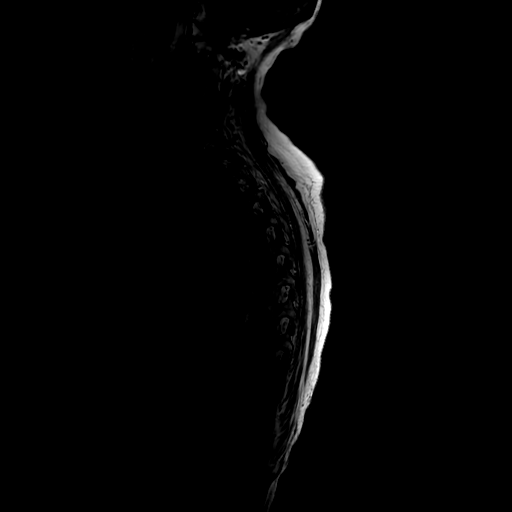
[im 8/12]
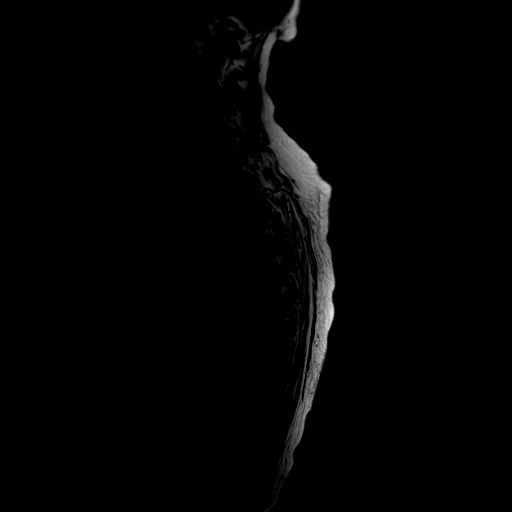
[im 12/12]
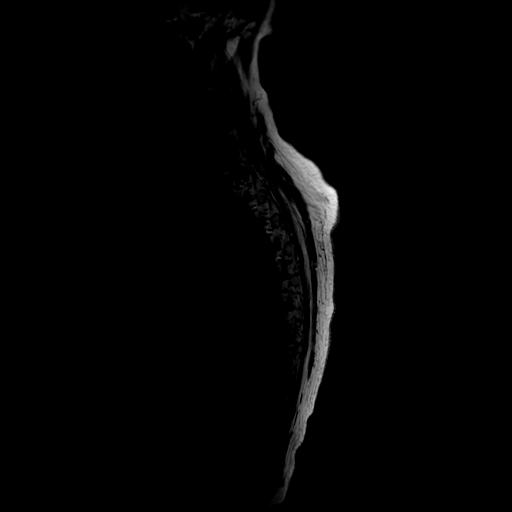

[Series 12: T2 · sagittal · 3.0mm · 0.66mm/px · 6 of 17 slices shown (1 of 2)]
[im 1/17]
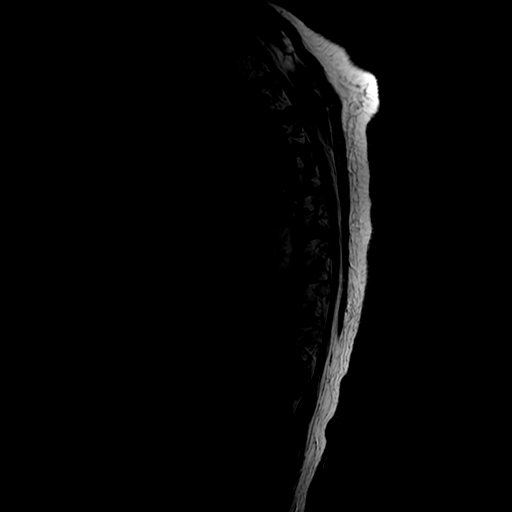
[im 4/17]
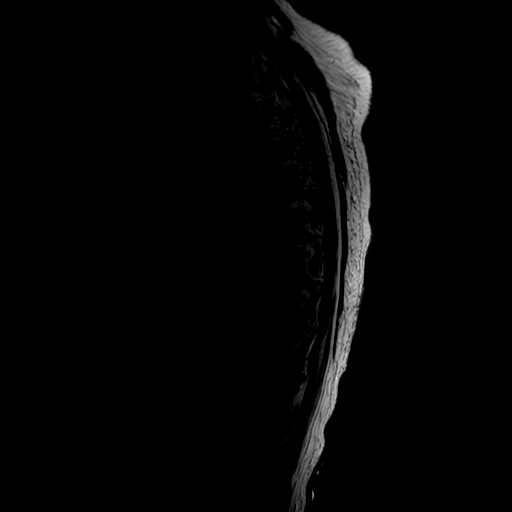
[im 7/17]
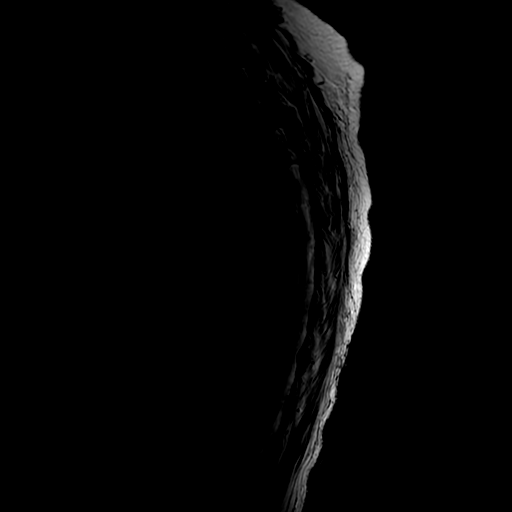
[im 10/17]
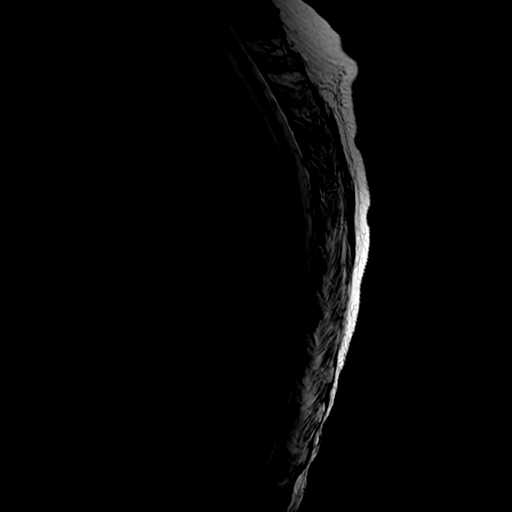
[im 13/17]
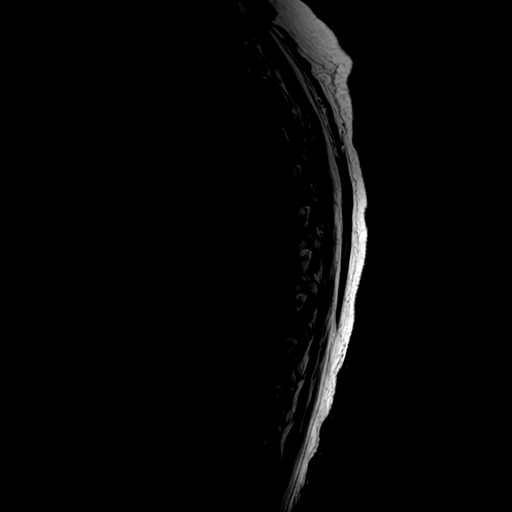
[im 17/17]
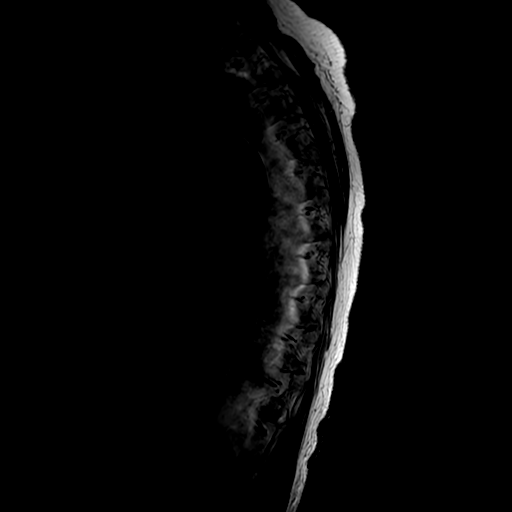

[Series 14: T1 · sagittal · 3.0mm · 0.66mm/px · 3 of 17 slices shown (2 of 2)]
[im 4/17]
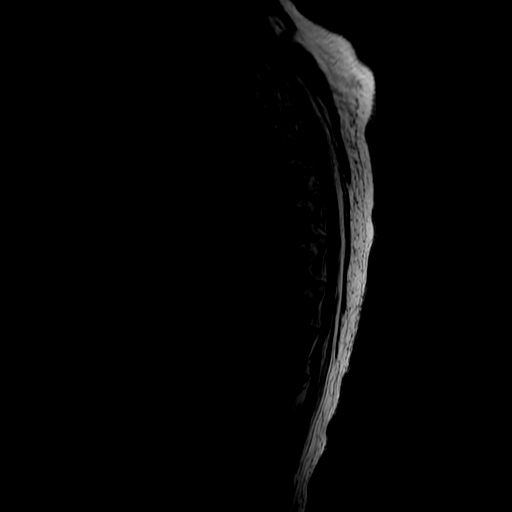
[im 10/17]
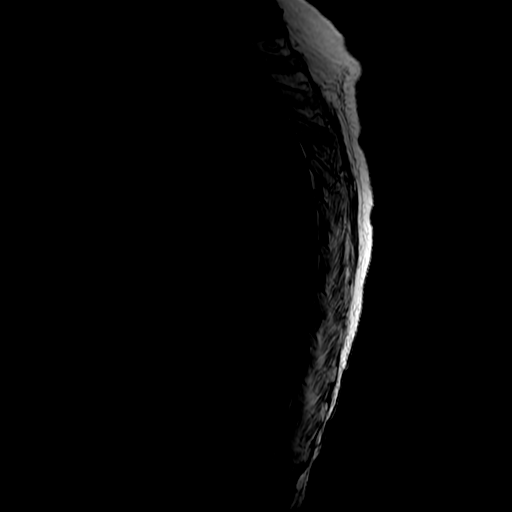
[im 17/17]
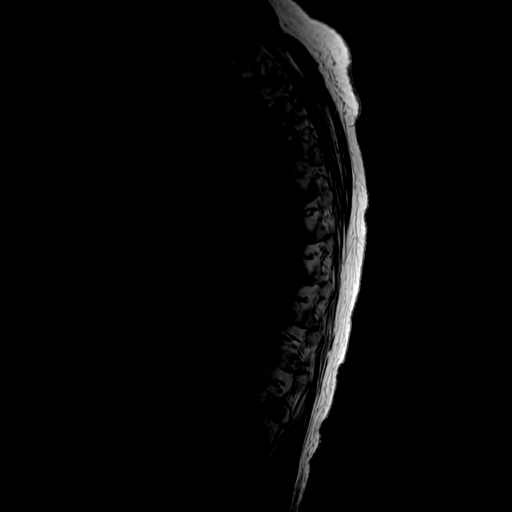

[Series 15: T2 · axial · 4.0mm · 0.39mm/px · z∈[-353,-158]mm · 7 of 39 slices shown (2 of 2)]
[im 1/39]
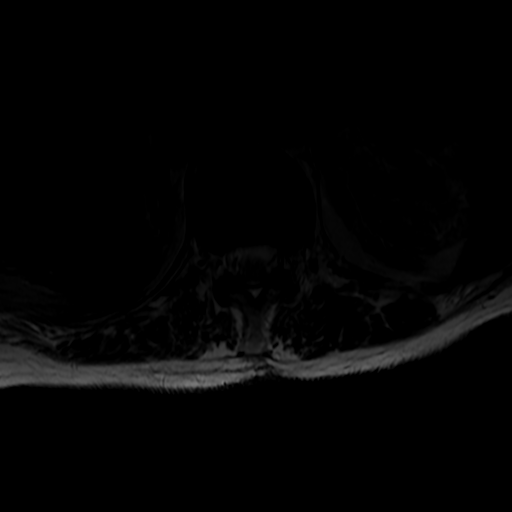
[im 7/39]
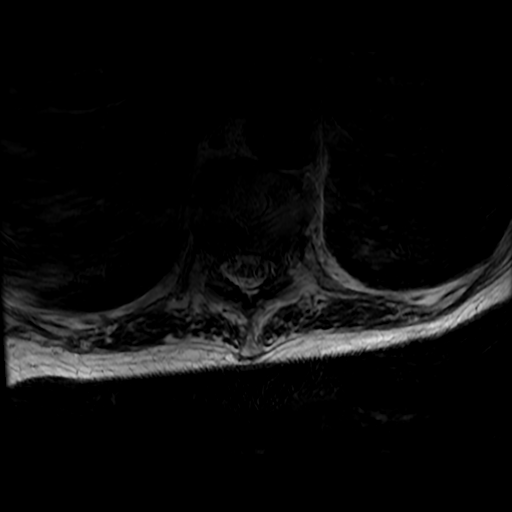
[im 13/39]
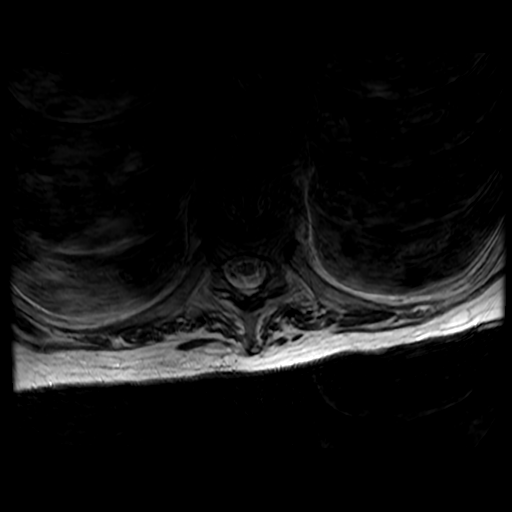
[im 16/39]
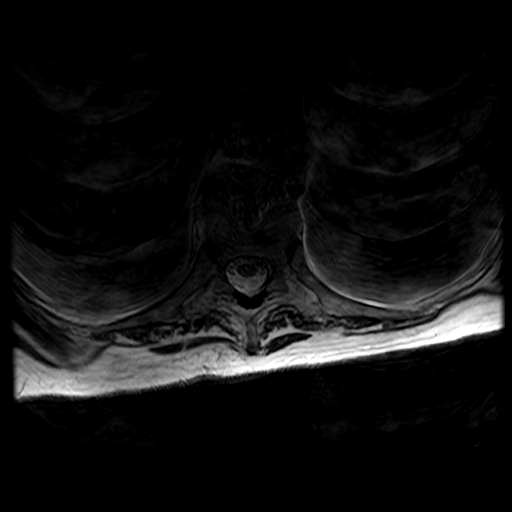
[im 20/39]
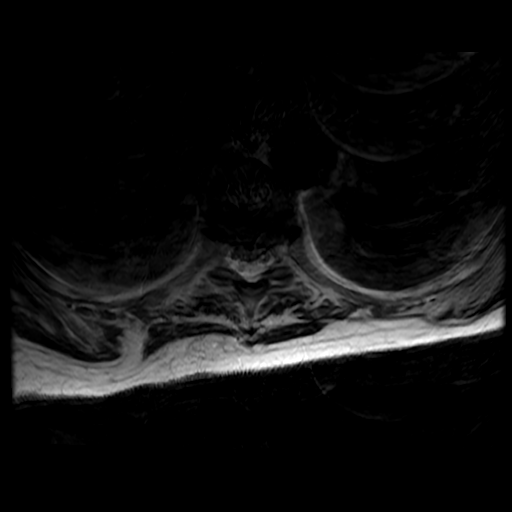
[im 23/39]
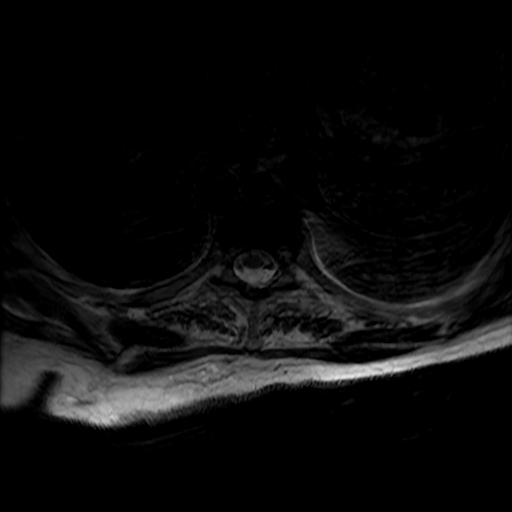
[im 32/39]
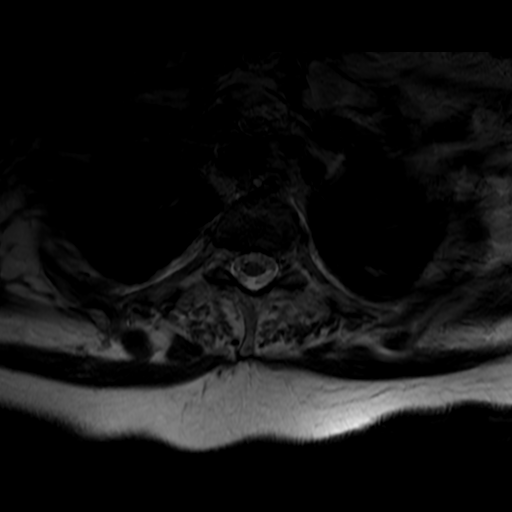

[19 of 48 positions shown; findings below may reference images not displayed]

FINDINGS: Alignment:  Slight anterolisthesis T3-4 otherwise normal alignment

Vertebrae: Negative for fracture or mass

Cord:  Normal signal and morphology

Paraspinal and other soft tissues: Small bilateral effusions. No
paraspinous mass.

Disc levels:

Disc degeneration throughout much of the thoracic spine with disc
space narrowing and Schmorl's node. Small central disc protrusion at
T6-7.

Negative for thoracic spinal stenosis.  No cord compression
IMPRESSION: Mild thoracic disc degeneration. Negative for spinal stenosis or
cord compression.

Small bilateral pleural effusions.

## 2021-06-10 IMAGING — MR MR CERVICAL SPINE W/O CM
8 of 16 series · 19 of 48 positions shown · non-contrast
Comparison: None.

CLINICAL DATA: Upper and lower extremity weakness

EXAM:
MRI CERVICAL SPINE WITHOUT CONTRAST
TECHNIQUE: Multiplanar, multisequence MR imaging of the cervical spine was
performed. No intravenous contrast was administered.

[Series 3: T2 · sagittal · 3.0mm · 0.43mm/px · 1 of 16 slices shown (1 of 6)]
[im 1/16]
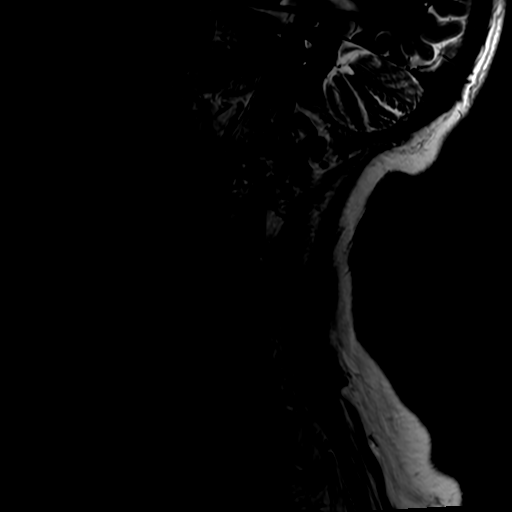

[Series 7: T2 · axial · 3.0mm · 0.35mm/px · z∈[-108,-17]mm · 3 of 28 slices shown (2 of 6)]
[im 1/28]
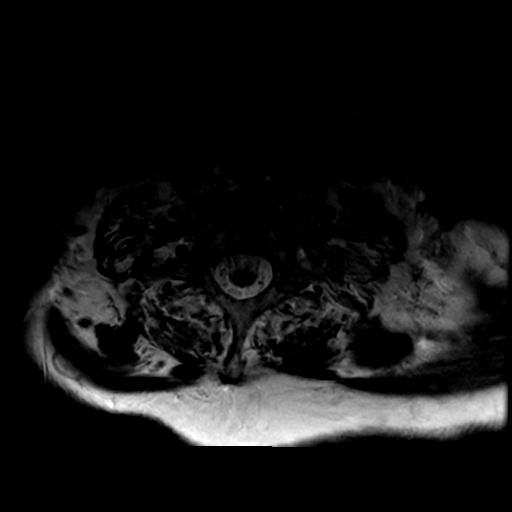
[im 14/28]
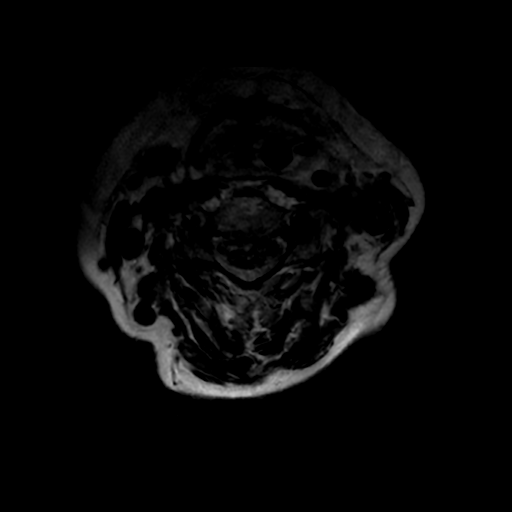
[im 28/28]
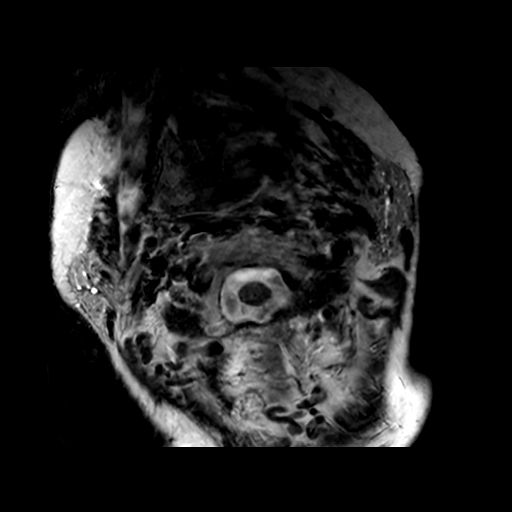

[Series 9: T1 · sagittal · 3.0mm · 0.90mm/px · 1 of 12 slices shown (1 of 2)]
[im 1/12]
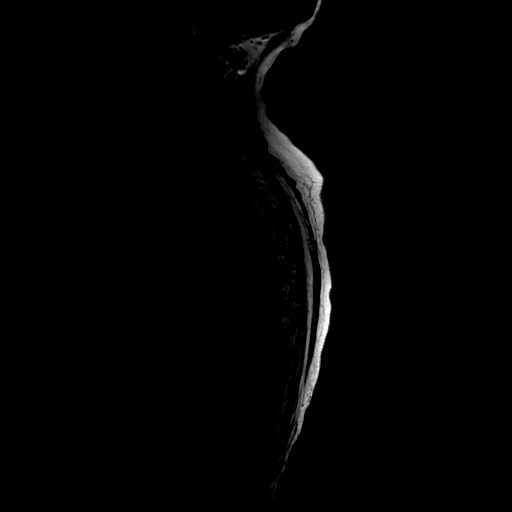

[Series 12: T2 · sagittal · 3.0mm · 0.66mm/px · 2 of 17 slices shown (3 of 6)]
[im 1/17]
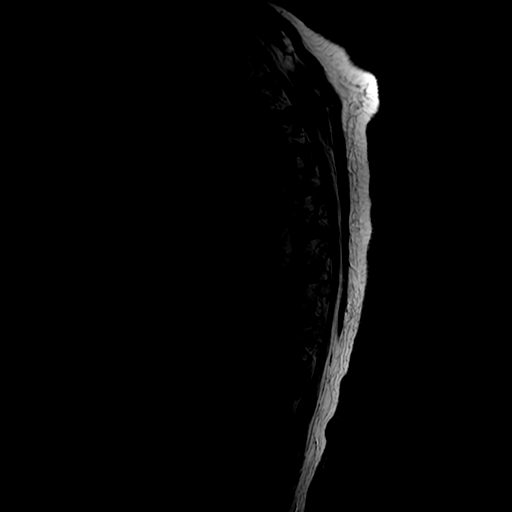
[im 17/17]
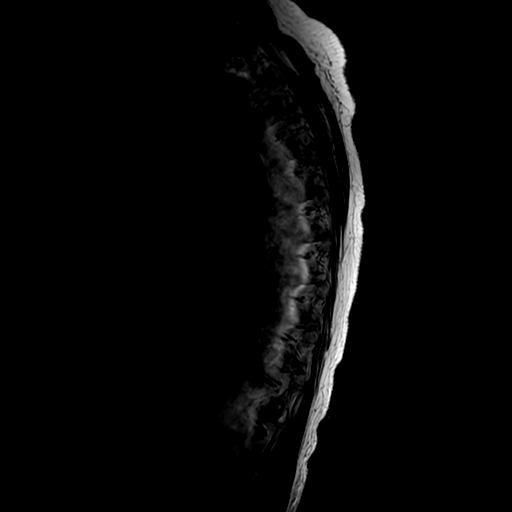

[Series 14: T1 · sagittal · 3.0mm · 0.66mm/px · 1 of 17 slices shown (2 of 2)]
[im 1/17]
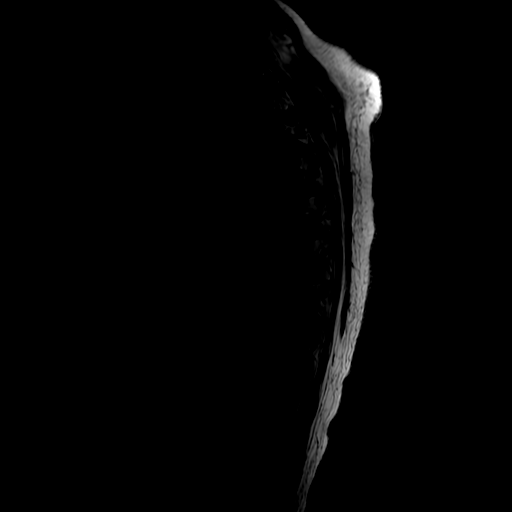

[Series 15: T2 · axial · 4.0mm · 0.39mm/px · z∈[-353,-120]mm · 4 of 39 slices shown (4 of 6)]
[im 1/39]
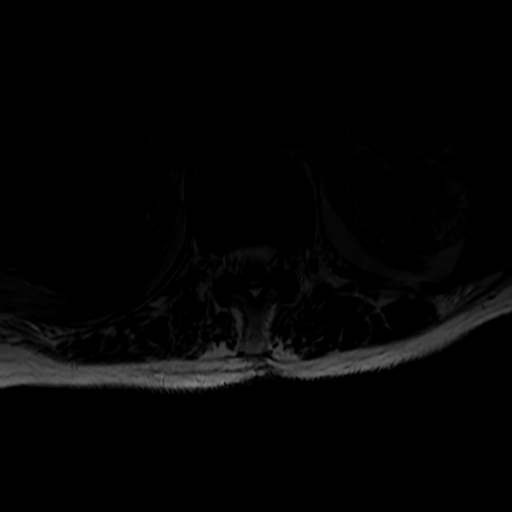
[im 13/39]
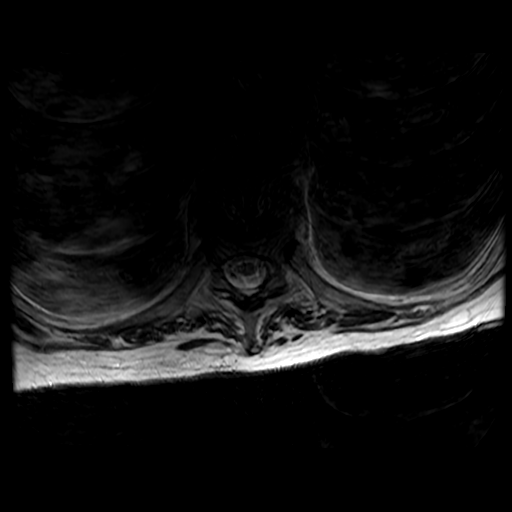
[im 26/39]
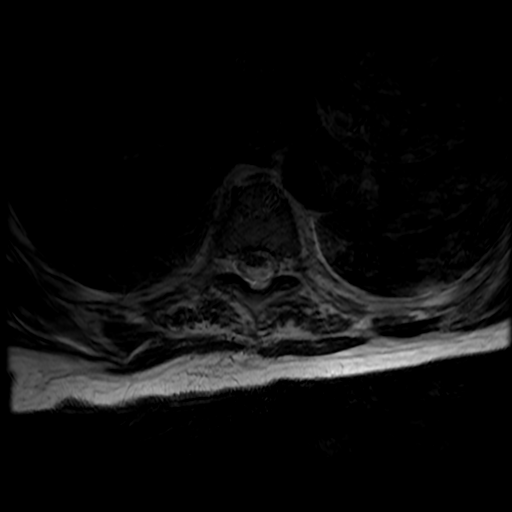
[im 39/39]
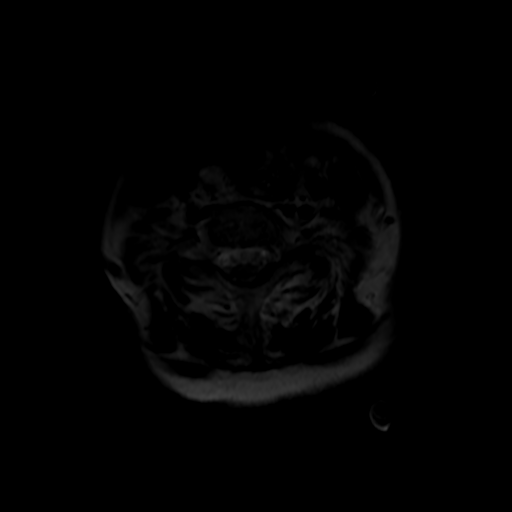

[Series 19: T2 · sagittal · 4.0mm · 0.55mm/px · 2 of 15 slices shown (5 of 6)]
[im 1/15]
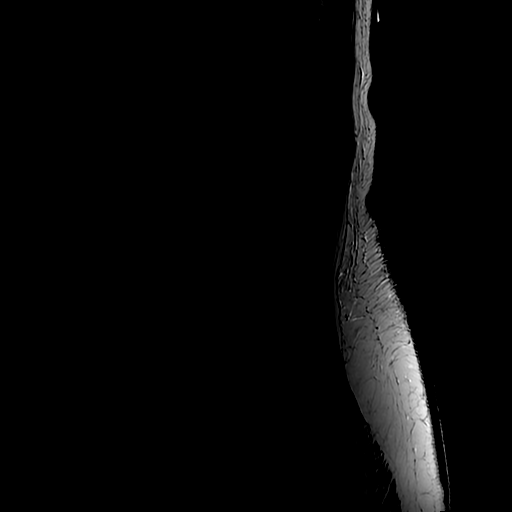
[im 15/15]
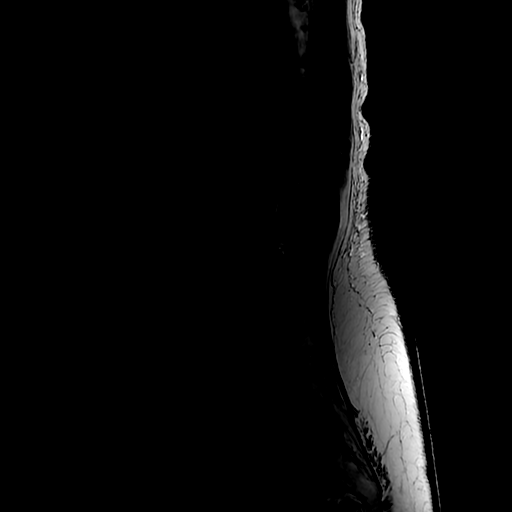

[Series 22: T2 · axial · 4.0mm · 0.39mm/px · z∈[-544,-330]mm · 5 of 42 slices shown (6 of 6)]
[im 1/42]
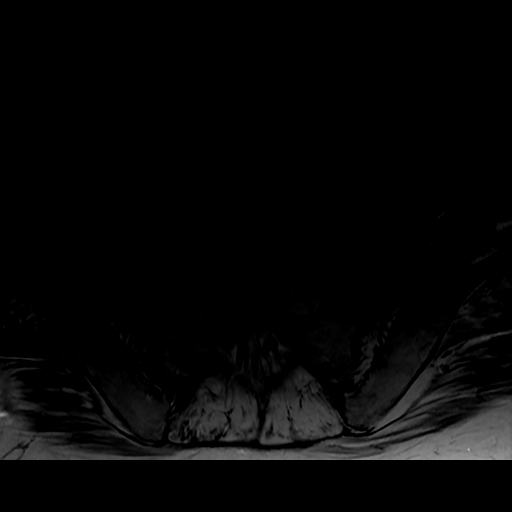
[im 11/42]
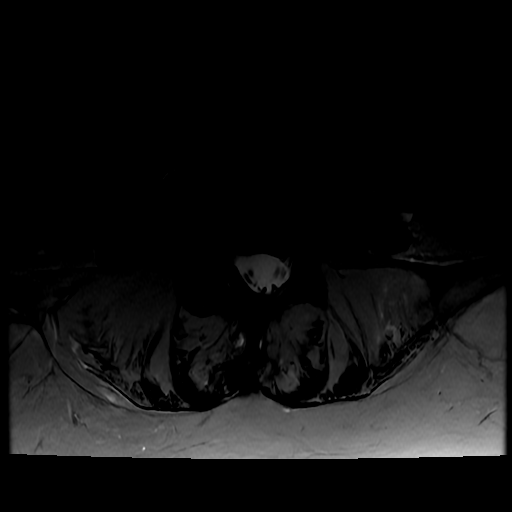
[im 21/42]
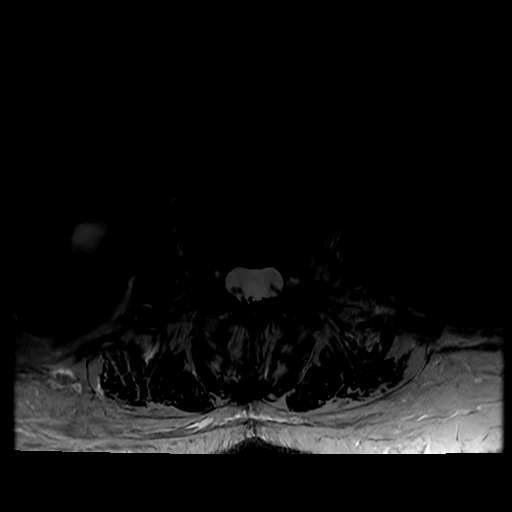
[im 31/42]
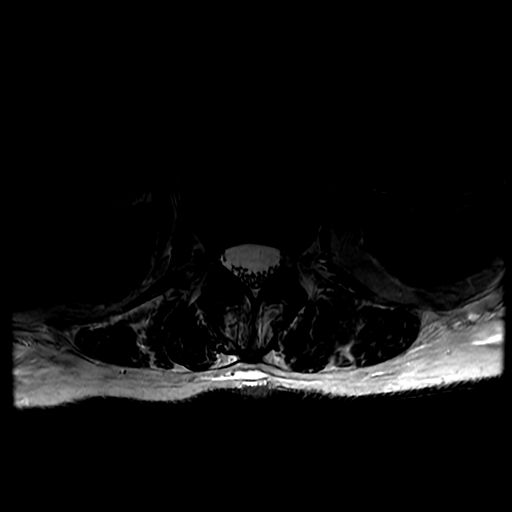
[im 42/42]
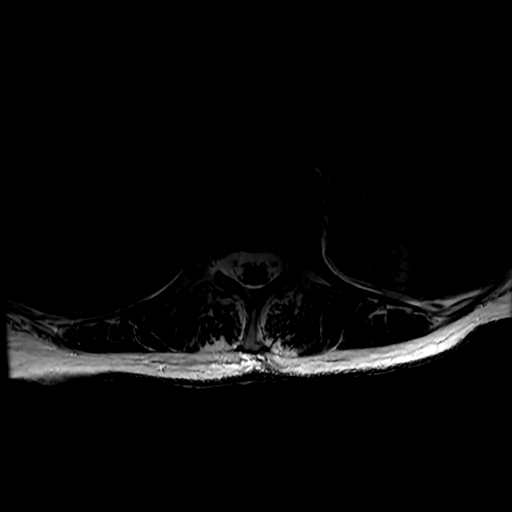

[19 of 48 positions shown; findings below may reference images not displayed]

FINDINGS: Alignment: Mild retrolisthesis C4-5 and C5-6. Mild anterolisthesis
C7-T1

Vertebrae: Negative for fracture or mass.

Cord: Normal signal and morphology

Posterior Fossa, vertebral arteries, paraspinal tissues: Negative

Disc levels:

C2-3: Mild disc degeneration. Small right-sided disc protrusion.
Negative for stenosis

C3-4: Mild disc degeneration and facet degeneration. Mild left
foraminal narrowing

C4-5: Disc degeneration. Disc space narrowing and diffuse uncinate
spurring. Bilateral facet degeneration. Mild foraminal narrowing
bilaterally.

C5-6: Disc degeneration with diffuse uncinate spurring. Bilateral
facet degeneration. Mild spinal stenosis. Moderate foraminal
stenosis bilaterally

C6-7: Disc degeneration and uncinate spurring. Mild foraminal
narrowing bilaterally

C7-T1: Mild anterolisthesis with mild facet degeneration. Negative
for stenosis.
IMPRESSION: Multilevel disc and facet degeneration in the cervical spine. Mild
spinal stenosis C5-6 with moderate foraminal narrowing bilaterally.
Mild foraminal narrowing at additional levels as described above.

## 2021-06-10 IMAGING — DX DG CHEST 1V PORT
1 series · 1 of 1 positions shown · non-contrast
Comparison: Chest x-ray [DATE].

CLINICAL DATA: 86-year-old female with history of hypoxia. Atrial
fibrillation.

EXAM:
PORTABLE CHEST 1 VIEW

[chest ap]
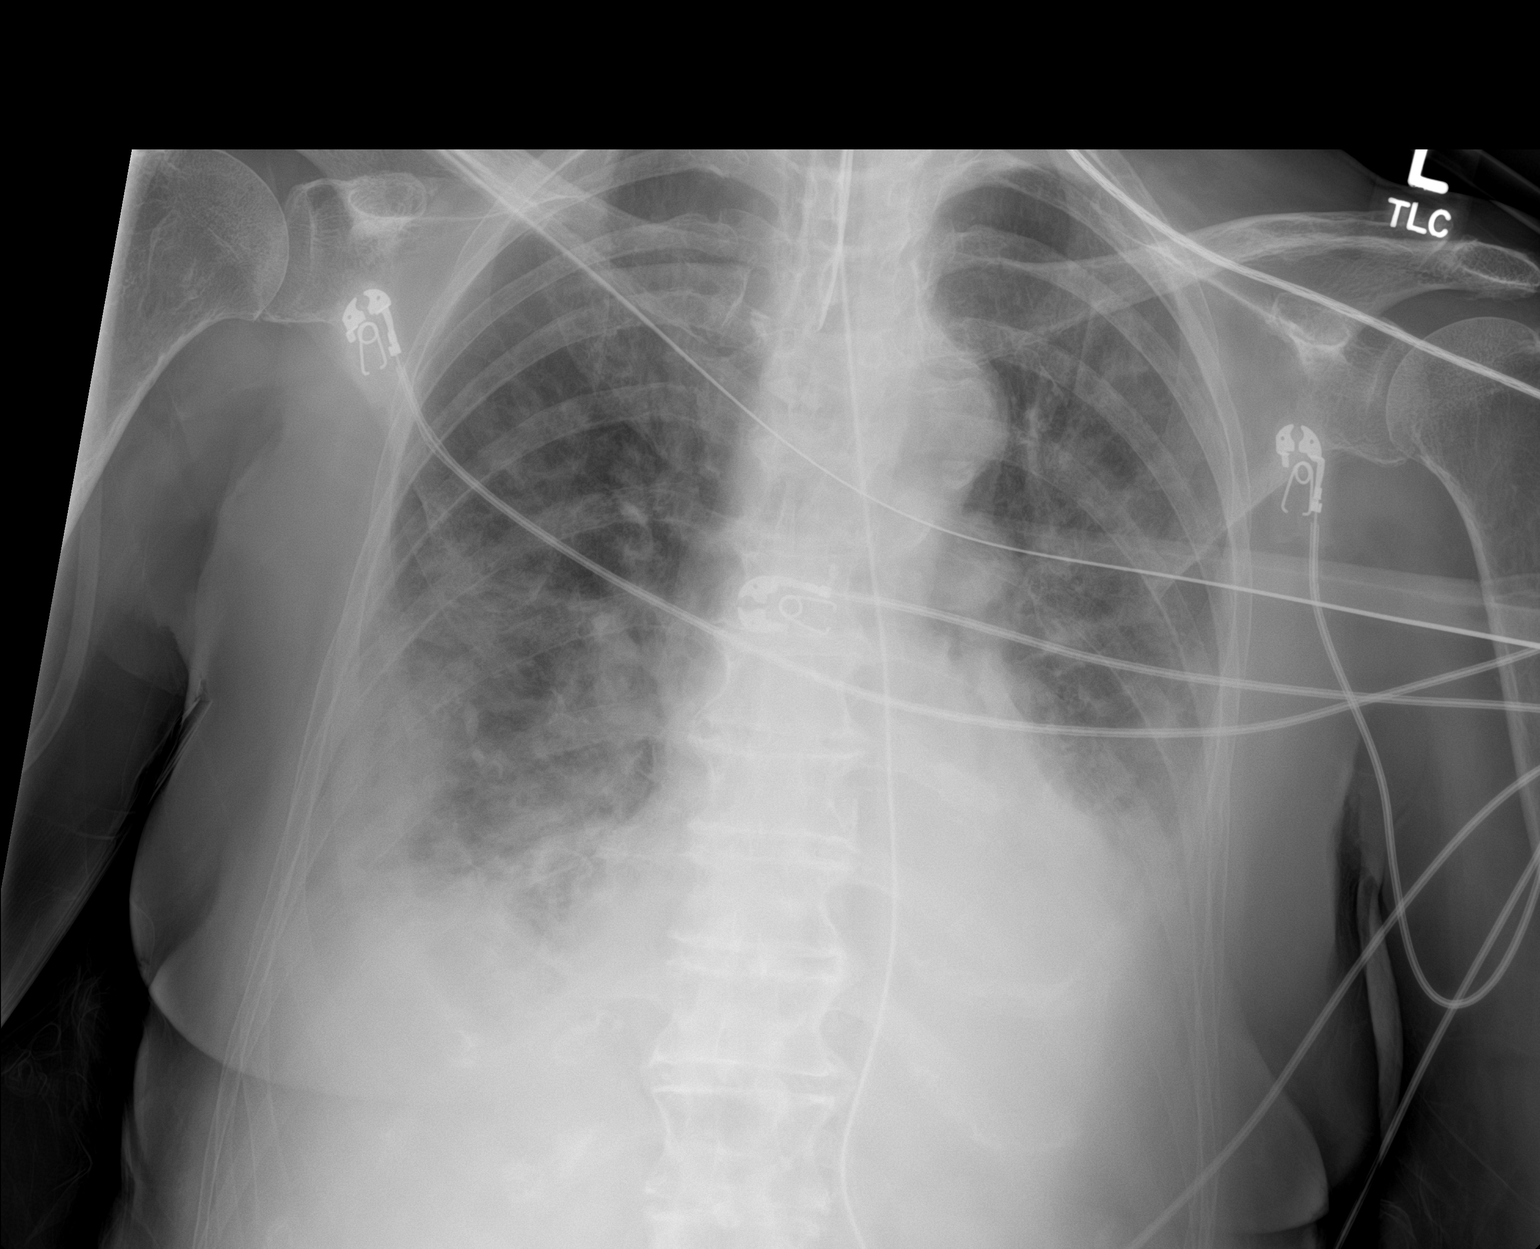

[1 of 1 positions shown; findings below may reference images not displayed]

FINDINGS: An endotracheal tube is in place with tip 5.2 cm above the carina. A
nasogastric tube is seen extending into the stomach, however, the
tip of the nasogastric tube extends below the lower margin of the
image. Patchy multifocal interstitial and airspace disease noted
throughout the mid to lower lungs bilaterally. More dense
opacification in the lung bases likely reflects associated
atelectasis, and superimposed bilateral pleural effusions (moderate
on the left and small on the right). Overall, aeration appears very
similar to the prior examination. No pneumothorax. Pulmonary
vasculature is largely obscured, but does not appear overly
engorged. Heart size is normal. Upper mediastinal contours are
within normal limits. Atherosclerotic calcifications in the thoracic
aorta.
IMPRESSION: 1. Support apparatus, as above.
2. Stable radiographic appearance of the chest, as above.

1.

## 2021-06-10 MED ORDER — FENTANYL BOLUS VIA INFUSION
25.0000 ug | INTRAVENOUS | Status: DC | PRN
  Administered 2021-06-10: 50 ug via INTRAVENOUS
  Administered 2021-06-10: 100 ug via INTRAVENOUS
  Administered 2021-06-10: 75 ug via INTRAVENOUS
  Administered 2021-06-10: 50 ug via INTRAVENOUS
  Administered 2021-06-11 (×4): 100 ug via INTRAVENOUS
  Administered 2021-06-11: 50 ug via INTRAVENOUS
  Administered 2021-06-11 – 2021-06-13 (×9): 100 ug via INTRAVENOUS
  Filled 2021-06-10: qty 100

## 2021-06-10 MED ORDER — POTASSIUM CHLORIDE 20 MEQ PO PACK
40.0000 meq | PACK | Freq: Once | ORAL | Status: AC
  Administered 2021-06-10: 40 meq
  Filled 2021-06-10: qty 2

## 2021-06-10 MED ORDER — DOCUSATE SODIUM 50 MG/5ML PO LIQD
100.0000 mg | Freq: Two times a day (BID) | ORAL | Status: DC
  Administered 2021-06-10 – 2021-06-13 (×7): 100 mg
  Filled 2021-06-10 (×7): qty 10

## 2021-06-10 MED ORDER — POLYETHYLENE GLYCOL 3350 17 G PO PACK: 17.0000 g | PACK | Freq: Every day | ORAL | Status: DC

## 2021-06-10 MED ORDER — FUROSEMIDE 10 MG/ML IJ SOLN
40.0000 mg | Freq: Once | INTRAMUSCULAR | Status: AC
Start: 2021-06-10 — End: 2021-06-10
  Administered 2021-06-10: 40 mg via INTRAVENOUS
  Filled 2021-06-10: qty 4

## 2021-06-10 NOTE — Evaluation (Addendum)
Physical Therapy Evaluation Patient Details Name: Brooke [name] MRN: [id number] DOB: [birth year] Today's Date: 06/10/2021  History of Present Illness  86 yo admitted 11/27 with back pain and falls with hypoxemia requiring intubation 11/28-11/30. Reintubated 11/30. PMHx: HTN, HLD, depression, CAD, glaucoma, Afib  Clinical Impression  Pt awake on vent and able to nod head to name. Pt with generalized gaze without tracking and no noted glasses apparent in room with unclear visual ability. Pt able to transition to EOB with significant weakness without family present to confirm PLOf and home setup. Pt with decreased strength, function, activity tolerance, cognition and balance who will benefit from acute therapy to maximize mobility, safety and decrease burden of care. Called spouse for PLOF and home setup but no answer  ETT 24cm maintained, PS/CPAP FiO2 30% with SPO2 98% HR 111       Recommendations for follow up therapy are one component of a multi-disciplinary discharge planning process, led by the attending physician.  Recommendations may be updated based on patient status, additional functional criteria and insurance authorization.  Follow Up Recommendations Skilled nursing-short term rehab (<3 hours/day)    Assistance Recommended at Discharge Frequent or constant Supervision/Assistance  Functional Status Assessment Patient has had a recent decline in their functional status and/or demonstrates limited ability to make significant improvements in function in a reasonable and predictable amount of time  Equipment Recommendations  Hospital bed    Recommendations for Other Services       Precautions / Restrictions Precautions Precautions: Other (comment);Fall Precaution Comments: ETT, vent, NGT, bil mittens Restrictions Weight Bearing Restrictions: No      Mobility  Bed Mobility Overal bed mobility: Needs Assistance Bed Mobility: Rolling;Sidelying to Sit;Sit to Sidelying Rolling: Max  assist Sidelying to sit: Max assist;+2 for physical assistance     Sit to sidelying: Max assist;+2 for physical assistance General bed mobility comments: pt with assist of pad to roll bil for linen change and positioning. Max +2 to elevate trunk from surface and bring legs off of bed. With return to supine physical assist to control trunk and lift legs to surface.    Transfers Overall transfer level: Needs assistance   Transfers: Sit to/from Stand Sit to Stand: Mod assist;+2 physical assistance           General transfer comment: mod +2 physical assist with pad cradling sacrum to stand with bil knee block. 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[employer] (Stroke Patients Only)       Balance Overall balance assessment: Needs assistance Sitting-balance support: Bilateral upper extremity supported;Feet supported Sitting balance-Leahy Scale: Poor Sitting balance - Comments: min assist sitting EOB with bil UE support   Standing balance support: Bilateral upper extremity supported Standing balance-Leahy Scale: Poor Standing balance comment: mod assist for standing balance with bil knees blocked, bil UE support and pad to cradle sacrum                             Pertinent Vitals/Pain Pain Assessment: CPOT Facial Expression: Relaxed, neutral Body Movements: Protection Muscle Tension: Relaxed Compliance with ventilator (intubated pts.): Tolerating ventilator or movement Vocalization (extubated  pts.): N/A CPOT Total: 1 Pain Intervention(s): Repositioned;Monitored during session    Home Living Family/patient expects to be discharged to:: Unsure                   Additional Comments: pt unable to provide and no family present    Prior Function                        Hand Dominance        Extremity/Trunk Assessment   Upper Extremity Assessment Upper Extremity Assessment: Generalized weakness;Difficult to assess due to impaired cognition    Lower Extremity Assessment Lower Extremity Assessment: Generalized weakness;Difficult to assess due to impaired cognition    Cervical / Trunk Assessment Cervical / Trunk Assessment: Kyphotic  Communication   Communication: Other (comment) (intubated)  Cognition Arousal/Alertness: Awake/alert Behavior During Therapy: Flat affect Overall Cognitive Status: Impaired/Different from baseline Area of Impairment: Attention;Following commands                   Current Attention Level: Focused   Following Commands: Follows one step commands inconsistently       General Comments: pt able to nod head to name, would not repond to questions of function or home setup. pt with lack of visual fixation and tracking and unsure of visual capability, waving on command        General Comments      Exercises     Assessment/Plan    PT Assessment Patient needs continued PT services  PT Problem List Decreased strength;Decreased mobility;Decreased safety awareness;Decreased activity tolerance;Decreased balance;Decreased cognition;Cardiopulmonary status limiting activity;Decreased knowledge of use of DME;Decreased coordination       PT Treatment Interventions DME instruction;Gait training;Functional mobility training;Therapeutic activities;Patient/family education;Cognitive remediation;Neuromuscular re-education;Balance training;Therapeutic exercise    PT Goals (Current goals can be found in the Care Plan section)  Acute Rehab PT Goals PT Goal Formulation: Patient unable to participate in goal setting Time For Goal Achievement: 06/24/21 Potential to Achieve Goals: Fair    Frequency Min 3X/week   Barriers to discharge Decreased caregiver support      Co-evaluation                AM-PAC PT "6 Clicks" Mobility  Outcome Measure Help needed turning from your back to your side while in a flat bed without using bedrails?: A Lot Help needed moving from lying on your back to sitting on the side of a flat bed without using bedrails?: Total Help needed moving to and from a bed to a chair (including a wheelchair)?: Total Help needed standing up from a chair using your arms (e.g., wheelchair or bedside chair)?: Total Help needed to walk in hospital room?: Total Help needed climbing 3-5 steps with a railing? : Total 6 Click Score: 7    End of Session   Activity Tolerance: Patient tolerated treatment well Patient left: in bed;with bed alarm set;with nursing/sitter in room Nurse Communication: Mobility status;Need for lift equipment PT Visit Diagnosis: Other abnormalities of gait and mobility (R26.89);Difficulty in walking, not elsewhere classified (R26.2);Muscle weakness (generalized) (M62.81)    Time: [id number] PT Time Calculation (min) (ACUTE ONLY): 24 min   Charges:   PT Evaluation $PT Eval High Complexity: 1 High PT Treatments $Therapeutic Activity: 8-22 mins        Kasidee Voisin P, PT Acute Rehabilitation Services Pager: [phone] Office: [phone]   Shaley Leavens B Janya Eveland 06/10/2021, 10:25 AM

## 2021-06-10 NOTE — Progress Notes (Signed)
NAME:  Brooke [name], MRN:  [id number], DOB:  [birth year], LOS: 5 ADMISSION DATE:  05/13/2021, CONSULTATION DATE:  11/28 REFERRING MD:  Sharl Ma , CHIEF COMPLAINT:  respiratory failure    History of Present Illness:  This is an 86 year old female patient with past medical history as per below.  Presented to the emergency room chronic 11/27 with chief complaint of cough, shortness of breath, and worsening weakness.  Apparently had fallen about 1 month prior to presentation since that time has had progressive weakness, feels like at times her legs just "give out" she is required help with activities of daily living at home by her husband.  Over the last 4-5 days had developed a cough, said she felt like she was "warm" on day of admission had developed worsening and progressive shortness of breath.  EMS was called and on arrival room air saturations 64% in ER she was found to have a temperature of 101.3 chest x-ray showed right lower lobe airspace disease as well as left-sided pleural effusion she was admitted with a working diagnosis of pneumonia and possible heart failure therapeutic interventions included supplemental oxygen, IV ceftriaxone and doxycycline, gentle IV hydration and plan to admit.  Over the following 24 hours her respiratory failure continued to worsen going from supplemental oxygen via nasal cannula then to high flow nasal cannula then to mix of both high flow and nonrebreather mask and unable to bring saturation greater than 80%.  Because of this pulmonary was asked to evaluate  Pertinent  Medical History  Afib on apixaban CAD, HTN, HLD, Depression Recent lower back injury being followed by orthopedics (awaiting lumbar MRI) Osteoarthritis  Significant Hospital Events: Including procedures, antibiotic start and stop dates in addition to other pertinent events   11/27 admitted w/ working dx of PNA ceftriaxone and doxy started. Resp viral panel neg.  11/28 progressive resp failure no  improvement w/ high flow oxygen and NRB. Intubated. 11/29: weaning Vent support, Cont.  abx, diuresis. Still bradycardic ECHO hyperdynamic w/ EF 75% gd 2 diastolic dysfxn. No WM abnormality. RA size mildly elevated.  11/30 WBC normalized. Passing SBT. Sedation stopped. Extubated. DOAC placed on hold. Foley d/c'd. 12/1 reintubated overnight, minimal vent supports, alert on the vent 12/2 No acute events overnight  Interim History / Subjective:  Seen lying in bed after standing with PT on the vent  Alert and able to follow simple commands on vent   Objective   Blood pressure (!) 108/56, pulse (!) 106, temperature 99.3 F (37.4 C), temperature source Oral, resp. rate 19, height 5\' 6"  (1.676 m), weight 67.3 kg, SpO2 100 %.    Vent Mode: PSV;CPAP FiO2 (%):  [40 %] 40 % Set Rate:  [21 bmp] 21 bmp Vt Set:  [350 mL] 350 mL PEEP:  [5 cmH20] 5 cmH20 Pressure Support:  [5 cmH20-8 cmH20] 5 cmH20 Plateau Pressure:  [11 cmH20-14 cmH20] 12 cmH20   Intake/Output Summary (Last 24 hours) at 06/10/2021 0807 Last data filed at 06/10/2021 0600 Gross per 24 hour  Intake 1207.25 ml  Output 400 ml  Net 807.25 ml    Filed Weights   06/08/21 0359 06/09/21 0500 06/10/21 0426  Weight: 72.1 kg 66.6 kg 67.3 kg    Examination: General: Acute on chronically ill appearing elderly female lying in bed on mechanical ventilation, in NAD HEENT: ETT, MM pink/moist, PERRL,  Neuro: Alert and able to follow intermittent commands on vent  CV: s1s2 regular rate and rhythm, no murmur, rubs, or gallops,  PULM:  Clear to ascultation, no increased work of breathing, tolerating SBT GI: soft, bowel sounds active in all 4 quadrants, non-tender, non-distended, tolerating TF Extremities: warm/dry, no  edema  Skin: no rashes or lesions  Resolved Hospital Problem list     Assessment & Plan:   Acute Hypoxic respiratory Failure 2/2 CAP  Pleural effusions, Pulmonary Edema -Effusion appears stable on CXR 12/2 P: Continue  ventilator support with lung protective strategies  Currently tolerating SBT may consider extubation again  Wean PEEP and FiO2 for sats greater than 90% Head of bed elevated 30 degrees Plateau pressures less than 30 cm H20 Follow intermittent chest x-ray and ABG Ensure adequate pulmonary hygiene  Follow cultures  VAP bundle in place  PAD protocol Continue Doxy and Ceftriaxone   Sepsis from CAP w/ septic shock vs drug induced hypotension (improved) P: Vent support as above  Follow cultures  Antibiotics as above  Pressors stopped 12/2, MAP goal > 65 Monitor urine output Capillary refill  Hx. Afib currently SB -SB resolved  Acute Diastolic HF -echo as above  Hx of HLD  P: Continuous telemetry  ASA and statin Strict intake and output  Daily weight to assess volume status Daily assessment for need to diurese with the use of IV lasix   Closely monitor renal function and electrolytes  Ensure hemodynamic control Eliquis remains on hold K goal > 4, Mg goal >2 GDMT as able   Hx Depression P: Continue Paxil  Best Practice (right click and "Reselect all SmartList Selections" daily)   Diet/type: tubefeeds DVT prophylaxis: SCD GI prophylaxis: H2B Lines: N/A Foley:  N/A Code Status:  full code Last date of multidisciplinary goals of care discussion [pending]  CRITICAL CARE Performed by: Shan Padgett D. Harris   Total critical care time: 38 minutes  Critical care time was exclusive of separately billable procedures and treating other patients.  Critical care was necessary to treat or prevent imminent or life-threatening deterioration.  Critical care was time spent personally by me on the following activities: development of treatment plan with patient and/or surrogate as well as nursing, discussions with consultants, evaluation of patient's response to treatment, examination of patient, obtaining history from patient or surrogate, ordering and performing treatments and  interventions, ordering and review of laboratory studies, ordering and review of radiographic studies, pulse oximetry and re-evaluation of patient's condition.  Reynard Christoffersen D. Kenton Kingfisher, NP-C [facility] Personal contact information can be found on Amion  06/10/2021, 8:32 AM

## 2021-06-10 NOTE — Progress Notes (Signed)
Pt transported from 3M05 to MRI and back without any complications. RN at bedside, RT will continue to monitor.

## 2021-06-10 NOTE — Progress Notes (Signed)
[facility] ADULT ICU REPLACEMENT PROTOCOL   The patient does apply for the [facility] Adult ICU Electrolyte Replacment Protocol based on the criteria listed below:   1.Exclusion criteria: TCTS patients, ECMO patients, and Dialysis patients 2. Is GFR >/= 30 ml/min? Yes.    Patient's GFR today is >60 3. Is SCr </= 2? Yes.   Patient's SCr is 0.70 mg/dL 4. Did SCr increase >/= 0.5 in 24 hours? No. 5.Pt's weight >40kg  Yes.   6. Abnormal electrolyte(s): K+ 3.6  7. Electrolytes replaced per protocol 8.  Call MD STAT for K+ </= 2.5, Phos </= 1, or Mag </= 1 Physician:  n/a  Melvern Banker 06/10/2021 5:43 AM

## 2021-06-10 NOTE — Progress Notes (Signed)
  Progress Note   Date: 06/10/2021  Patient Name: Brooke [name]        MRN#: [id number]    Sepsis was present on admission (at the time of the admission order)

## 2021-06-10 NOTE — Progress Notes (Signed)
An USGPIV (ultrasound guided PIV) has been placed for short-term vasopressor infusion. Should this treatment be needed beyond 48hours, best practice supports seeking central line access.  It will be the responsibility of the bedside nurse to follow best practice to prevent extravasations including placing the IVWatch when indicated.   

## 2021-06-11 ENCOUNTER — Inpatient Hospital Stay (HOSPITAL_COMMUNITY): Payer: Medicare Other

## 2021-06-11 DIAGNOSIS — J189 Pneumonia, unspecified organism: Secondary | ICD-10-CM | POA: Diagnosis not present

## 2021-06-11 LAB — BASIC METABOLIC PANEL
Anion gap: 6 (ref 5–15)
Anion gap: 6 (ref 5–15)
BUN: 22 mg/dL (ref 8–23)
BUN: 22 mg/dL (ref 8–23)
CO2: 33 mmol/L — ABNORMAL HIGH (ref 22–32)
CO2: 32 mmol/L (ref 22–32)
Calcium: 8.9 mg/dL (ref 8.9–10.3)
Calcium: 8.7 mg/dL — ABNORMAL LOW (ref 8.9–10.3)
Chloride: 105 mmol/L (ref 98–111)
Chloride: 106 mmol/L (ref 98–111)
Creatinine, Ser: 0.57 mg/dL (ref 0.44–1.00)
Creatinine, Ser: 0.59 mg/dL (ref 0.44–1.00)
GFR, Estimated: >60 mL/min (ref >60)
GFR, Estimated: >60 mL/min (ref >60)
Glucose, Bld: 131 mg/dL — ABNORMAL HIGH (ref 70–99)
Glucose, Bld: 168 mg/dL — ABNORMAL HIGH (ref 70–99)
Potassium: 4.3 mmol/L (ref 3.5–5.1)
Potassium: 4.1 mmol/L (ref 3.5–5.1)
Sodium: 144 mmol/L (ref 135–145)
Sodium: 144 mmol/L (ref 135–145)

## 2021-06-11 LAB — GLUCOSE, CAPILLARY
Glucose-Capillary: 131 mg/dL — ABNORMAL HIGH (ref 70–99)
Glucose-Capillary: 130 mg/dL — ABNORMAL HIGH (ref 70–99)
Glucose-Capillary: 114 mg/dL — ABNORMAL HIGH (ref 70–99)
Glucose-Capillary: 138 mg/dL — ABNORMAL HIGH (ref 70–99)
Glucose-Capillary: 174 mg/dL — ABNORMAL HIGH (ref 70–99)
Glucose-Capillary: 160 mg/dL — ABNORMAL HIGH (ref 70–99)

## 2021-06-11 LAB — CBC
HCT: 38 % (ref 36.0–46.0)
Hemoglobin: 12.3 g/dL (ref 12.0–15.0)
MCH: 32.1 pg (ref 26.0–34.0)
MCHC: 32.4 g/dL (ref 30.0–36.0)
MCV: 99.2 fL (ref 80.0–100.0)
Platelets: 258 10*3/uL (ref 150–400)
RBC: 3.83 MIL/uL — ABNORMAL LOW (ref 3.87–5.11)
RDW: 14.9 % (ref 11.5–15.5)
WBC: 12.8 10*3/uL — ABNORMAL HIGH (ref 4.0–10.5)
nRBC: 0 % (ref 0.0–0.2)

## 2021-06-11 LAB — PHOSPHORUS: Phosphorus: 2.8 mg/dL (ref 2.5–4.6)

## 2021-06-11 LAB — MAGNESIUM: Magnesium: 2.3 mg/dL (ref 1.7–2.4)

## 2021-06-11 IMAGING — DX DG ABD PORTABLE 1V
1 series · 2 of 2 positions shown · non-contrast
Comparison: [DATE]

CLINICAL DATA: Constipation

EXAM:
PORTABLE ABDOMEN - 1 VIEW

[Series 1: abdomen · 0.14mm/px · 2 of 2 slices shown]
[im 1/2]
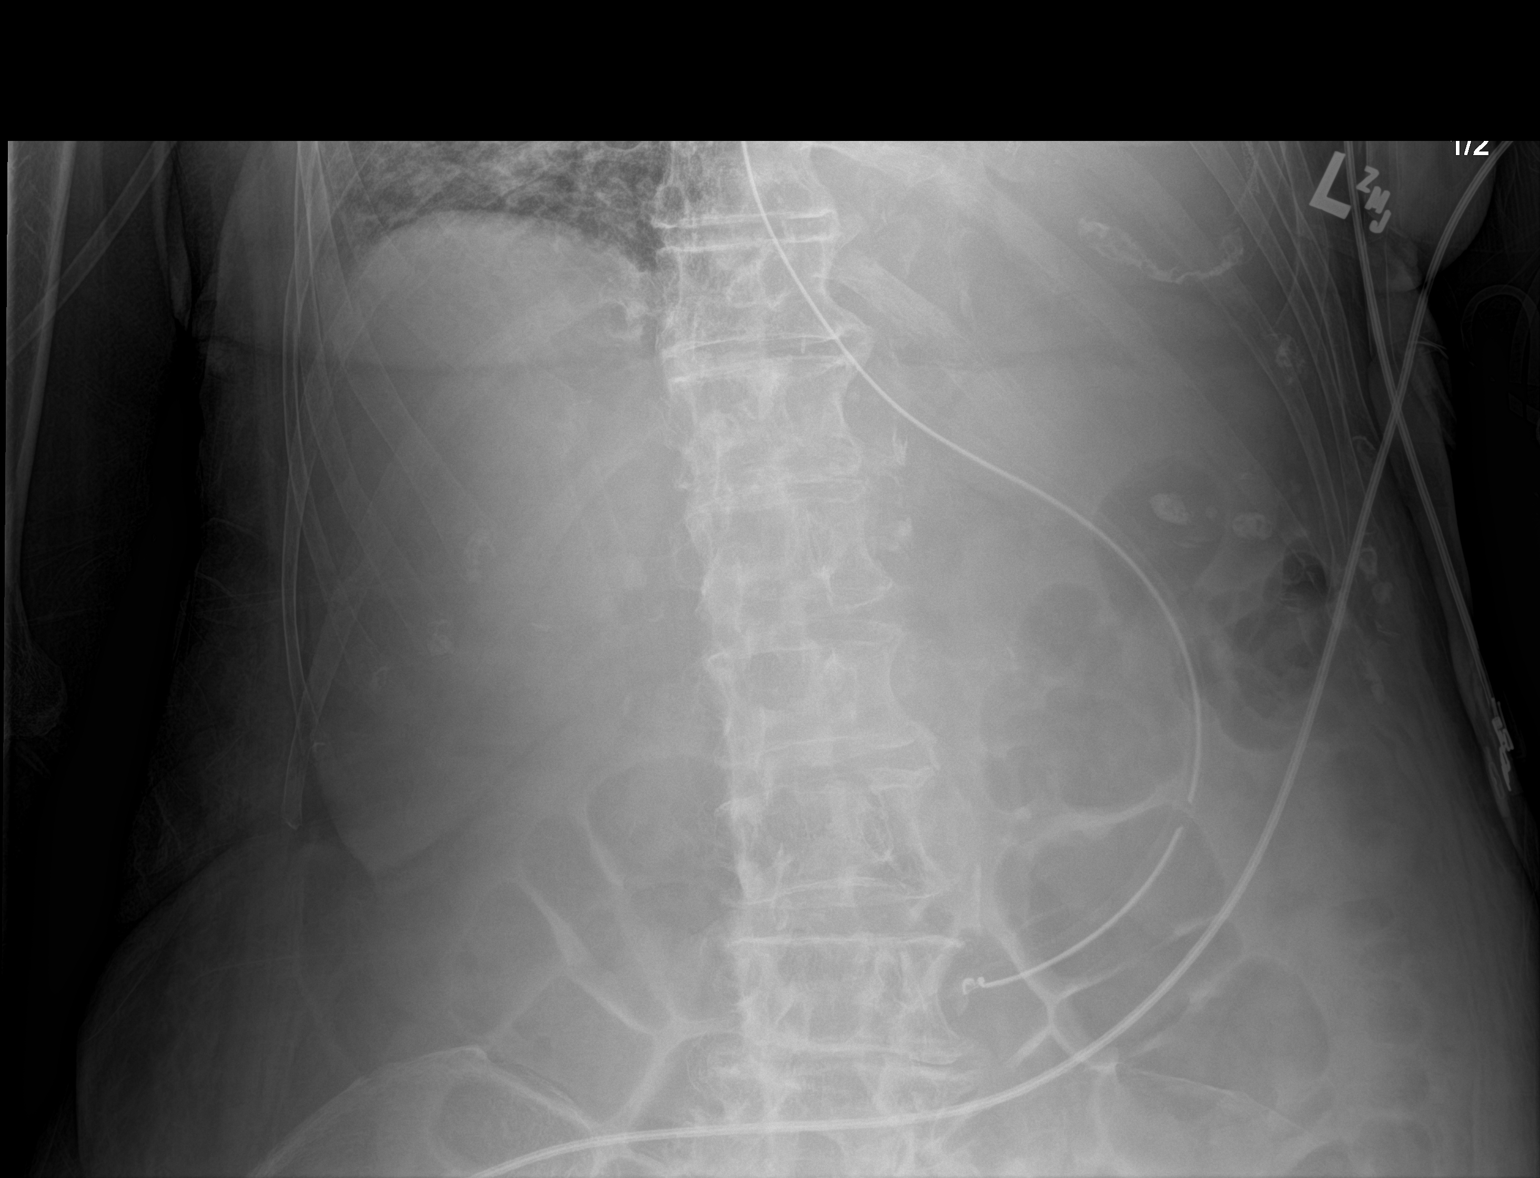
[im 2/2]
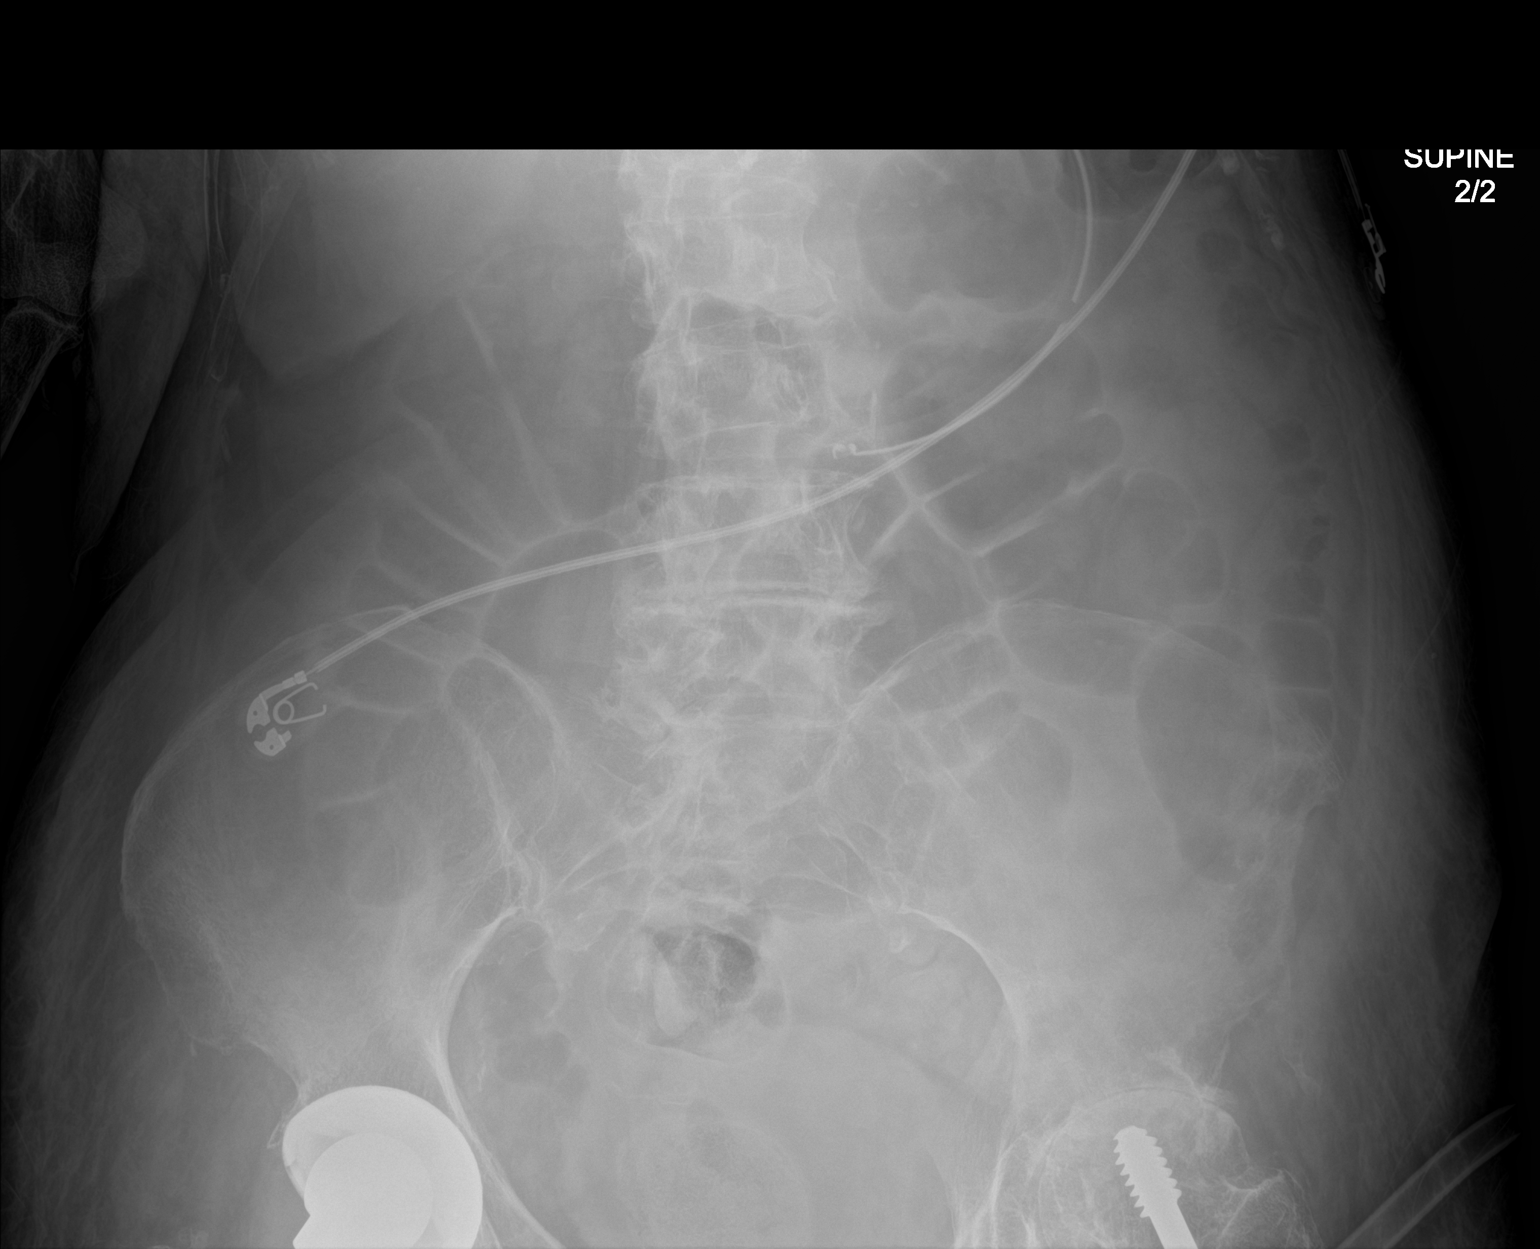

[2 of 2 positions shown; findings below may reference images not displayed]

FINDINGS: Enteric tube is within the stomach. Air is noted throughout much of
the colon without significant distension. Some stool is present
within the rectum. There is no significant stool burden.
IMPRESSION: Enteric tube within stomach.  No significant stool burden.

## 2021-06-11 MED ORDER — ENOXAPARIN SODIUM 80 MG/0.8ML IJ SOSY
70.0000 mg | PREFILLED_SYRINGE | Freq: Once | INTRAMUSCULAR | Status: AC
Start: 2021-06-11 — End: 2021-06-11
  Administered 2021-06-11: 70 mg via SUBCUTANEOUS
  Filled 2021-06-11: qty 0.8

## 2021-06-11 MED ORDER — FUROSEMIDE 10 MG/ML IJ SOLN
40.0000 mg | Freq: Once | INTRAMUSCULAR | Status: AC
  Administered 2021-06-11: 40 mg via INTRAVENOUS
  Filled 2021-06-11: qty 4

## 2021-06-11 MED ORDER — SODIUM CHLORIDE 3 % IN NEBU
4.0000 mL | INHALATION_SOLUTION | Freq: Two times a day (BID) | RESPIRATORY_TRACT | Status: DC
Start: 2021-06-12 — End: 2021-06-13
  Administered 2021-06-12 – 2021-06-13 (×3): 4 mL via RESPIRATORY_TRACT
  Filled 2021-06-11 (×4): qty 4

## 2021-06-11 MED ORDER — SODIUM CHLORIDE 0.9 % IV SOLN
2.0000 g | Freq: Once | INTRAVENOUS | Status: AC
  Administered 2021-06-11: 2 g via INTRAVENOUS
  Filled 2021-06-11: qty 20

## 2021-06-11 NOTE — Progress Notes (Signed)
Pt placed back on full vent support due to desat. and tachycardia. Pt  tolerating well, RN aware, MD aware, RT will continue to monitor.

## 2021-06-11 NOTE — Progress Notes (Addendum)
NAME:  Brooke [name], MRN:  [id number], DOB:  December 09, 1934, LOS: 6 ADMISSION DATE:  05/19/2021, CONSULTATION DATE:  11/28 REFERRING MD:  Darrick Meigs , CHIEF COMPLAINT:  respiratory failure    History of Present Illness:  This is an 86 year old female patient with past medical history as per below.  Presented to the emergency room chronic 11/27 with chief complaint of cough, shortness of breath, and worsening weakness.  Apparently had fallen about 1 month prior to presentation since that time has had progressive weakness, feels like at times her legs just "give out" she is required help with activities of daily living at home by her husband.  Over the last 4-5 days had developed a cough, said she felt like she was "warm" on day of admission had developed worsening and progressive shortness of breath.  EMS was called and on arrival room air saturations 64% in ER she was found to have a temperature of 101.3 chest x-ray showed right lower lobe airspace disease as well as left-sided pleural effusion she was admitted with a working diagnosis of pneumonia and possible heart failure therapeutic interventions included supplemental oxygen, IV ceftriaxone and doxycycline, gentle IV hydration and plan to admit.  Over the following 24 hours her respiratory failure continued to worsen going from supplemental oxygen via nasal cannula then to high flow nasal cannula then to mix of both high flow and nonrebreather mask and unable to bring saturation greater than 80%.  Because of this pulmonary was asked to evaluate  Pertinent  Medical History  Afib on apixaban CAD, HTN, HLD, Depression Recent lower back injury being followed by orthopedics (awaiting lumbar MRI) Osteoarthritis  Significant Hospital Events: Including procedures, antibiotic start and stop dates in addition to other pertinent events   11/27 admitted w/ working dx of PNA ceftriaxone and doxy started. Resp viral panel neg.  11/28 progressive resp failure no  improvement w/ high flow oxygen and NRB. Intubated. 11/29: weaning Vent support, Cont.  abx, diuresis. Still bradycardic ECHO hyperdynamic w/ EF [id number] gd 2 diastolic dysfxn. No WM abnormality. RA size mildly elevated.  11/30 WBC normalized. Passing SBT. Sedation stopped. Extubated. DOAC placed on hold. Foley d/c'd. 12/1 reintubated overnight, minimal vent supports, alert on the vent 12/2 No acute events overnight  Interim History / Subjective:  Desaturation only 10 min into PSV trial this morning, tachycardic.   Objective   Blood pressure 99/69, pulse (!) 135, temperature 98.5 F (36.9 C), temperature source Oral, resp. rate 11, height 5\' 6"  (1.676 m), weight 67.7 kg, SpO2 94 %.    Vent Mode: PRVC FiO2 (%):  [40 %-100 %] 100 % Set Rate:  [21 bmp] 21 bmp Vt Set:  [350 mL] 350 mL PEEP:  [5 cmH20] 5 cmH20 Pressure Support:  [5 cmH20] 5 cmH20 Plateau Pressure:  [15 cmH20-19 cmH20] 15 cmH20   Intake/Output Summary (Last 24 hours) at 06/11/2021 0854 Last data filed at 06/11/2021 0600 Gross per 24 hour  Intake 1838.91 ml  Output 720 ml  Net 1118.91 ml   Filed Weights   06/09/21 0500 06/10/21 0426 06/11/21 0450  Weight: 66.6 kg 67.3 kg 67.7 kg    Examination: General appearance: 86 y.o., female, intubated Eyes: tracking, PERRL HENT: NCAT; dry MM Neck: Trachea midline; no lymphadenopathy, no JVD Lungs: Diminished bilaterally/mechanical breath sounds, equal chest rise  CV: tachy IRIR, no murmur  Abdomen: Soft, non-tender; non-distended, BS present  Extremities: 2+ ble peripheral edema, warm Neuro: Globally super weak  S Cr stable   Resolved  Hospital Problem list     Assessment & Plan:   Acute Hypoxic respiratory Failure 2/2 CAP  Pleural effusions, Pulmonary Edema -Effusion appears stable on CXR 12/2 P: Lung protective ventilation Wean PEEP and FiO2 for sats greater than 90% Head of bed elevated 30 degrees De-escalate hypertonic nebs, not sure if they're still doing much  for her at this point Follow cultures  VAP bundle in place  PAD protocol Continue Ceftriaxone 7 day course, last day today  Sepsis from CAP w/ septic shock vs drug induced hypotension (improved) P: Vent support as above  Follow cultures  Antibiotics as above  Wean levo for MAP 65 Monitor urine output Capillary refill  Hx. Afib currently SB -SB resolved  Acute Diastolic HF -echo as above  Hx of HLD  P: Continuous telemetry  ASA and statin Strict intake and output  Daily weight to assess volume status Redose lasix IV 40 BID Closely monitor renal function and electrolytes  Ensure hemodynamic control Eliquis remains on hold K goal > 4, Mg goal >2 GDMT as able   Hx Depression P: Continue Paxil  Best Practice (right click and "Reselect all SmartList Selections" daily)   Diet/type: tubefeeds DVT prophylaxis: SCD GI prophylaxis: H2B Lines: N/A Foley:  N/A Code Status:  full code Last date of multidisciplinary goals of care discussion [pending]  CRITICAL CARE Performed by: Omar Person   Total critical care time: 33 minutes  Critical care time was exclusive of separately billable procedures and treating other patients.  Critical care was necessary to treat or prevent imminent or life-threatening deterioration.  Critical care was time spent personally by me on the following activities: development of treatment plan with patient and/or surrogate as well as nursing, discussions with consultants, evaluation of patient's response to treatment, examination of patient, obtaining history from patient or surrogate, ordering and performing treatments and interventions, ordering and review of laboratory studies, ordering and review of radiographic studies, pulse oximetry and re-evaluation of patient's condition.  [facility]  Personal contact information can be found on Amion  06/11/2021, 8:54 AM

## 2021-06-12 ENCOUNTER — Other Ambulatory Visit: Payer: Self-pay | Admitting: Cardiology

## 2021-06-12 ENCOUNTER — Inpatient Hospital Stay (HOSPITAL_COMMUNITY): Payer: Medicare Other

## 2021-06-12 DIAGNOSIS — I480 Paroxysmal atrial fibrillation: Secondary | ICD-10-CM

## 2021-06-12 DIAGNOSIS — J189 Pneumonia, unspecified organism: Secondary | ICD-10-CM | POA: Diagnosis not present

## 2021-06-12 LAB — CBC
HCT: 37.2 % (ref 36.0–46.0)
Hemoglobin: 11.7 g/dL — ABNORMAL LOW (ref 12.0–15.0)
MCH: 31.3 pg (ref 26.0–34.0)
MCHC: 31.5 g/dL (ref 30.0–36.0)
MCV: 99.5 fL (ref 80.0–100.0)
Platelets: 280 10*3/uL (ref 150–400)
RBC: 3.74 MIL/uL — ABNORMAL LOW (ref 3.87–5.11)
RDW: 15 % (ref 11.5–15.5)
WBC: 11.7 10*3/uL — ABNORMAL HIGH (ref 4.0–10.5)
nRBC: 0.2 % (ref 0.0–0.2)

## 2021-06-12 LAB — GLUCOSE, CAPILLARY
Glucose-Capillary: 134 mg/dL — ABNORMAL HIGH (ref 70–99)
Glucose-Capillary: 143 mg/dL — ABNORMAL HIGH (ref 70–99)
Glucose-Capillary: 149 mg/dL — ABNORMAL HIGH (ref 70–99)
Glucose-Capillary: 144 mg/dL — ABNORMAL HIGH (ref 70–99)
Glucose-Capillary: 164 mg/dL — ABNORMAL HIGH (ref 70–99)
Glucose-Capillary: 127 mg/dL — ABNORMAL HIGH (ref 70–99)

## 2021-06-12 LAB — BASIC METABOLIC PANEL
Anion gap: 7 (ref 5–15)
BUN: 22 mg/dL (ref 8–23)
CO2: 33 mmol/L — ABNORMAL HIGH (ref 22–32)
Calcium: 8.7 mg/dL — ABNORMAL LOW (ref 8.9–10.3)
Chloride: 103 mmol/L (ref 98–111)
Creatinine, Ser: 0.53 mg/dL (ref 0.44–1.00)
GFR, Estimated: >60 mL/min (ref >60)
Glucose, Bld: 139 mg/dL — ABNORMAL HIGH (ref 70–99)
Potassium: 3.8 mmol/L (ref 3.5–5.1)
Sodium: 143 mmol/L (ref 135–145)

## 2021-06-12 LAB — TRIGLYCERIDES: Triglycerides: 129 mg/dL (ref <150)

## 2021-06-12 MED ORDER — POTASSIUM CHLORIDE 20 MEQ PO PACK
40.0000 meq | PACK | Freq: Once | ORAL | Status: AC
  Administered 2021-06-12: 09:00:00 40 meq
  Filled 2021-06-12: qty 2

## 2021-06-12 MED ORDER — ACETAMINOPHEN 325 MG PO TABS: 650.0000 mg | ORAL_TABLET | Freq: Four times a day (QID) | ORAL | Status: DC | PRN

## 2021-06-12 MED ORDER — FUROSEMIDE 10 MG/ML IJ SOLN
40.0000 mg | Freq: Two times a day (BID) | INTRAMUSCULAR | Status: AC
  Administered 2021-06-12 – 2021-06-13 (×2): 40 mg via INTRAVENOUS
  Filled 2021-06-12 (×2): qty 4

## 2021-06-12 MED ORDER — ENOXAPARIN SODIUM 80 MG/0.8ML IJ SOSY
70.0000 mg | PREFILLED_SYRINGE | Freq: Two times a day (BID) | INTRAMUSCULAR | Status: DC
Start: 2021-06-12 — End: 2021-06-13
  Administered 2021-06-12 – 2021-06-13 (×2): 70 mg via SUBCUTANEOUS
  Filled 2021-06-12 (×2): qty 0.8

## 2021-06-12 MED ORDER — BISACODYL 10 MG RE SUPP
10.0000 mg | Freq: Once | RECTAL | Status: AC
  Administered 2021-06-12: 14:00:00 10 mg via RECTAL
  Filled 2021-06-12: qty 1

## 2021-06-12 NOTE — Progress Notes (Signed)
RT NOTE: RT attempted SBT on patient on CPAP/PSV of 12/5 at 0835.  RT noticed that patient began to use accessory muscles and ventilator was showing that patient wasn't getting full breath.  Attempted to lavage patient and RT was able to obtain a moderate amount of thick secretions.  Patient placed back on full support.  RN gave patient a bolus.  Will continue to monitor.

## 2021-06-12 NOTE — Progress Notes (Signed)
NAME:  Brooke [name], MRN:  [id number], DOB:  Jun 26, 1935, LOS: 7 ADMISSION DATE:  06/02/2021, CONSULTATION DATE:  11/28 REFERRING MD:  Darrick Meigs , CHIEF COMPLAINT:  respiratory failure    History of Present Illness:  This is an 86 year old female patient with past medical history as per below.  Presented to the emergency room chronic 11/27 with chief complaint of cough, shortness of breath, and worsening weakness.  Apparently had fallen about 1 month prior to presentation since that time has had progressive weakness, feels like at times her legs just "give out" she is required help with activities of daily living at home by her husband.  Over the last 4-5 days had developed a cough, said she felt like she was "warm" on day of admission had developed worsening and progressive shortness of breath.  EMS was called and on arrival room air saturations 64% in ER she was found to have a temperature of 101.3 chest x-ray showed right lower lobe airspace disease as well as left-sided pleural effusion she was admitted with a working diagnosis of pneumonia and possible heart failure therapeutic interventions included supplemental oxygen, IV ceftriaxone and doxycycline, gentle IV hydration and plan to admit.  Over the following 24 hours her respiratory failure continued to worsen going from supplemental oxygen via nasal cannula then to high flow nasal cannula then to mix of both high flow and nonrebreather mask and unable to bring saturation greater than 80%.  Because of this pulmonary was asked to evaluate  Pertinent  Medical History  Afib on apixaban CAD, HTN, HLD, Depression Recent lower back injury being followed by orthopedics (awaiting lumbar MRI) Osteoarthritis  Significant Hospital Events: Including procedures, antibiotic start and stop dates in addition to other pertinent events   11/27 admitted w/ working dx of PNA ceftriaxone and doxy started. Resp viral panel neg.  11/28 progressive resp failure no  improvement w/ high flow oxygen and NRB. Intubated. 11/29: weaning Vent support, Cont.  abx, diuresis. Still bradycardic ECHO hyperdynamic w/ EF [id number] gd 2 diastolic dysfxn. No WM abnormality. RA size mildly elevated.  11/30 WBC normalized. Passing SBT. Sedation stopped. Extubated. DOAC placed on hold. Foley d/c'd. 12/1 reintubated overnight, minimal vent supports, alert on the vent 12/2 No acute events overnight  Interim History / Subjective:  Increased work of breathing during SBT this morning, bagged, lavaged and some secretions cleared.   Objective   Blood pressure 95/66, pulse (!) 111, temperature 99.6 F (37.6 C), temperature source Oral, resp. rate 18, height 5\' 6"  (1.676 m), weight 70.6 kg, SpO2 100 %.    Vent Mode: PRVC FiO2 (%):  [40 %-100 %] 40 % Set Rate:  [21 bmp] 21 bmp Vt Set:  [350 mL] 350 mL PEEP:  [5 cmH20] 5 cmH20 Plateau Pressure:  [8 cmH20-16 cmH20] 8 cmH20   Intake/Output Summary (Last 24 hours) at 06/12/2021 1025 Last data filed at 06/12/2021 1000 Gross per 24 hour  Intake 2413.56 ml  Output 1650 ml  Net 763.56 ml   Filed Weights   06/10/21 0426 06/11/21 0450 06/12/21 0500  Weight: 67.3 kg 67.7 kg 70.6 kg    Examination: General appearance: 87 y.o., female, intubated Eyes: tracking, PERRL HENT: NCAT; dry MM Lungs: diminished bilaterally, equal chest rise CV: tachy IRIR, no murmur Abdomen: Soft, non-tender; non-distended, BS present Extremities: 1+ ble peripheral edema, warm Neuro: Globally super weak  S Cr stable  CXR improved aeration, smaller left effusion  Bedside US of left pleural effusion performed, interpreted by me  with decreased size of effusion     Resolved Hospital Problem list    Sepsis from CAP w/ septic shock vs drug induced hypotension (improved)  Assessment & Plan:   Acute Hypoxic respiratory Failure 2/2 CAP  Pleural effusions, Pulmonary Edema -Effusion appears stable on CXR 12/2, s/p 7d course ABX for pneumonia with end  date 12/3. Extubated 11/30, reintubated 11/30/. Very weak. P: SAT/SBT Lung protective ventilation Head of bed elevated 30 degrees De-escalate hypertonic nebs, not sure if they're still doing much for her at this point I don't think she would benefit substantially from thora for relatively small pleural effusion, will continue diuretic as below instead VAP bundle in place  PAD protocol  Hypotension Sedation-related at this point, ICU vasoplegia - wean levo for MAP 65 - PT/OT  Hx. Afib currently SB -SB resolved  Acute on chronic Diastolic HF, PH -echo as above  Hx of HLD  P: Continuous telemetry  ASA and statin Strict intake and output  Daily weight to assess volume status Redose lasix IV 40 BID today Closely monitor renal function and electrolytes  Ensure hemodynamic control Eliquis remains on hold K goal > 4, Mg goal >2 GDMT as able   Hx Depression P: Continue Paxil  Best Practice (right click and "Reselect all SmartList Selections" daily)   Diet/type: tubefeeds DVT prophylaxis: SCD GI prophylaxis: H2B Lines: N/A Foley:  N/A Code Status:  full code Last date of multidisciplinary goals of care discussion [12/2 husband does not think she would want tracheostomy, leaning toward do not intubate/DNR if we are able to extubate her]  CRITICAL CARE Performed by: Omar Person   Total critical care time: 30 minutes  Critical care time was exclusive of separately billable procedures and treating other patients.  Critical care was necessary to treat or prevent imminent or life-threatening deterioration.  Critical care was time spent personally by me on the following activities: development of treatment plan with patient and/or surrogate as well as nursing, discussions with consultants, evaluation of patient's response to treatment, examination of patient, obtaining history from patient or surrogate, ordering and performing treatments and interventions, ordering and  review of laboratory studies, ordering and review of radiographic studies, pulse oximetry and re-evaluation of patient's condition.  Lar[facility] Care  Personal contact information can be found on Amion  06/12/2021, 10:25 AM

## 2021-06-13 DIAGNOSIS — J9601 Acute respiratory failure with hypoxia: Secondary | ICD-10-CM | POA: Diagnosis not present

## 2021-06-13 LAB — BASIC METABOLIC PANEL
Anion gap: 5 (ref 5–15)
BUN: 24 mg/dL — ABNORMAL HIGH (ref 8–23)
CO2: 36 mmol/L — ABNORMAL HIGH (ref 22–32)
Calcium: 8.7 mg/dL — ABNORMAL LOW (ref 8.9–10.3)
Chloride: 102 mmol/L (ref 98–111)
Creatinine, Ser: 0.57 mg/dL (ref 0.44–1.00)
GFR, Estimated: >60 mL/min (ref >60)
Glucose, Bld: 143 mg/dL — ABNORMAL HIGH (ref 70–99)
Potassium: 4.1 mmol/L (ref 3.5–5.1)
Sodium: 143 mmol/L (ref 135–145)

## 2021-06-13 LAB — CBC
HCT: 37.2 % (ref 36.0–46.0)
Hemoglobin: 11.5 g/dL — ABNORMAL LOW (ref 12.0–15.0)
MCH: 30.9 pg (ref 26.0–34.0)
MCHC: 30.9 g/dL (ref 30.0–36.0)
MCV: 100 fL (ref 80.0–100.0)
Platelets: 329 10*3/uL (ref 150–400)
RBC: 3.72 MIL/uL — ABNORMAL LOW (ref 3.87–5.11)
RDW: 15.3 % (ref 11.5–15.5)
WBC: 11.6 10*3/uL — ABNORMAL HIGH (ref 4.0–10.5)
nRBC: 0.2 % (ref 0.0–0.2)

## 2021-06-13 LAB — GLUCOSE, CAPILLARY
Glucose-Capillary: 142 mg/dL — ABNORMAL HIGH (ref 70–99)
Glucose-Capillary: 132 mg/dL — ABNORMAL HIGH (ref 70–99)

## 2021-06-13 MED ORDER — MIDAZOLAM HCL 2 MG/2ML IJ SOLN
2.0000 mg | INTRAMUSCULAR | Status: DC | PRN
  Administered 2021-06-13: 2 mg via INTRAVENOUS
  Filled 2021-06-13: qty 2

## 2021-07-10 NOTE — Telephone Encounter (Signed)
Prescription refill request for Eliquis received.  Indication: afib  Last office visit: Brooke [name] 09/14/2020 Scr: 0.57, Jun 14, 2021 Age: 87 yo  Weight: 67.7 kg  Refill sent.

## 2021-07-10 NOTE — Plan of Care (Signed)

## 2021-07-10 NOTE — Progress Notes (Signed)
NAME:  Brooke [name], MRN:  [id number], DOB:  31-Mar-1935, LOS: 8 ADMISSION DATE:  05/13/2021, CONSULTATION DATE:  11/28 REFERRING MD:  Darrick Meigs , CHIEF COMPLAINT:  respiratory failure    History of Present Illness:  This is an 87 year old female patient with past medical history as per below.  Presented to the emergency room chronic 11/27 with chief complaint of cough, shortness of breath, and worsening weakness.  Apparently had fallen about 1 month prior to presentation since that time has had progressive weakness, feels like at times her legs just "give out" she is required help with activities of daily living at home by her husband.  Over the last 4-5 days had developed a cough, said she felt like she was "warm" on day of admission had developed worsening and progressive shortness of breath.  EMS was called and on arrival room air saturations 64% in ER she was found to have a temperature of 101.3 chest x-ray showed right lower lobe airspace disease as well as left-sided pleural effusion she was admitted with a working diagnosis of pneumonia and possible heart failure therapeutic interventions included supplemental oxygen, IV ceftriaxone and doxycycline, gentle IV hydration and plan to admit.  Over the following 24 hours her respiratory failure continued to worsen going from supplemental oxygen via nasal cannula then to high flow nasal cannula then to mix of both high flow and nonrebreather mask and unable to bring saturation greater than 80%.  Because of this pulmonary was asked to evaluate  Pertinent  Medical History  Afib on apixaban CAD, HTN, HLD, Depression Recent lower back injury being followed by orthopedics (awaiting lumbar MRI) Osteoarthritis  Significant Hospital Events: Including procedures, antibiotic start and stop dates in addition to other pertinent events   11/27 admitted w/ working dx of PNA ceftriaxone and doxy started. Resp viral panel neg.  11/28 progressive resp failure no  improvement w/ high flow oxygen and NRB. Intubated. 11/29: weaning Vent support, Cont.  abx, diuresis. Still bradycardic ECHO hyperdynamic w/ EF [id number] gd 2 diastolic dysfxn. No WM abnormality. RA size mildly elevated.  11/30 WBC normalized. Passing SBT. Sedation stopped. Extubated. DOAC placed on hold. Foley d/c'd. 12/1 reintubated overnight, minimal vent supports, alert on the vent 12/2 No acute events overnight  Interim History / Subjective:  12/5: on sbt 8/5 will attempt to decrease to 5/5 and potential extubate if tolerates. Would like to discuss with family goals about if pt fails extubation.   Objective   Blood pressure (!) 106/54, pulse (!) 122, temperature 98.7 F (37.1 C), temperature source Oral, resp. rate 18, height 5\' 6"  (1.676 m), weight 67.7 kg, SpO2 91 %.    Vent Mode: PSV;CPAP FiO2 (%):  [40 %] 40 % Set Rate:  [21 bmp] 21 bmp Vt Set:  [350 mL] 350 mL PEEP:  [5 cmH20] 5 cmH20 Pressure Support:  [5 [id number] cmH20] 5 cmH20 Plateau Pressure:  [12 cmH20-17 cmH20] 15 cmH20   Intake/Output Summary (Last 24 hours) at 2021/07/03 0912 Last data filed at 07/03/2021 0900 Gross per 24 hour  Intake 2020.89 ml  Output 900 ml  Net 1120.89 ml   Filed Weights   06/11/21 0450 06/12/21 0500 07/03/21 0530  Weight: 67.7 kg 70.6 kg 67.7 kg    Examination: General appearance: 87 y.o., female, intubated Eyes: tracking, PERRL HENT: NCAT; dry MM Lungs: diminished bilaterally, equal chest rise CV: tachy IRIR, no murmur Abdomen: Soft, non-tender; non-distended, BS present Extremities: 1+ ble peripheral edema, warm Neuro: Globally super weak  Resolved Hospital Problem list    Sepsis from CAP w/ septic shock vs drug induced hypotension (improved)  Assessment & Plan:   Acute Hypoxic respiratory Failure 2/2 CAP  Pleural effusions, Pulmonary Edema -Effusion appears stable on CXR 12/2, s/p 7d course ABX for pneumonia with end date 12/3. Extubated 11/30, reintubated 11/30/. Very  weak. P: SAT/SBT 5/5 at this time and tolerating but mental status still poor. Will wean fentanyl but have spoken with family about her course and condition. We have determined pt would not want trach and nursing home placement and I do think we have optimized her from a ventilatory standpoint.  -her mental status has not changed since last I saw her on nov 30-Dec 1. Which is also a poor prognosticator Lung protective ventilation Head of bed elevated 30 degrees VAP bundle in place  PAD protocol -will likely move to extubate today and transition to comfort should pt deteriorate  Hypotension Sedation-related at this point, ICU vasoplegia - wean levo for MAP 65, should pt decompensate post extubation we will stop this in favor of comfort measures.   Hx. Afib currently SB -SB resolved  Acute on chronic Diastolic HF, PH -echo as above  Hx of HLD  P: Continuous telemetry  ASA and statin Strict intake and output  Daily weight to assess volume status Closely monitor renal function and electrolytes  Ensure hemodynamic control Eliquis remains on hold K goal > 4, Mg goal >2 GDMT as able   Hx Depression P: Continue Paxil  Best Practice (right click and "Reselect all SmartList Selections" daily)   Diet/type: tubefeeds DVT prophylaxis: SCD GI prophylaxis: H2B Lines: N/A Foley:  N/A Code Status:  full code Last date of multidisciplinary goals of care discussion [12/5: DNR/DNI and will do one way extubation today]  Critical care time: The patient is critically ill with multiple organ systems failure and requires high complexity decision making for assessment and support, frequent evaluation and titration of therapies, application of advanced monitoring technologies and extensive interpretation of multiple databases.  Critical care time 38 mins. This represents my time independent of the NPs time taking care of the pt. This is excluding procedures.    Briant Sites DO [facility] 06/12/2021, 9:12 AM See Amion for pager If no response to pager, please call [phone] until 1900 After 1900 please call [facility] [phone]

## 2021-07-10 NOTE — Discharge Summary (Signed)
DEATH SUMMARY   Patient Details  Name: Brooke [name] MRN: [id number] DOB: Dec 30, 1934  Admission/Discharge Information   Admit Date:  26-Jun-2021  Date of Death: Date of Death: 07/04/2021  Time of Death: Time of Death: 46  Length of Stay: 11/06/22  Referring Physician: Isaac Bliss, Rayford Halsted, MD   Reason(s) for Hospitalization  Acute respiratory failure with hypoxia due to CAP, acute on chronic diastolic heart failure Acute metabolic encephalopathy due to sepsis from CAP Sepsis due to community acquired pneumonia  Diagnoses  Preliminary cause of death:  Secondary Diagnoses (including complications and co-morbidities):  Principal Problem:   Multifocal pneumonia Active Problems:   Atrial fibrillation ([place])   Essential hypertension   Hypoxemia   Prolonged QT interval   Weakness of both legs   Acute hypoxemic respiratory failure ([place])   Pressure injury of skin   Brief Hospital Course (including significant findings, care, treatment, and services provided and events leading to death)  Brooke [name] is a 87 y.o. year old female who was admitted after having a month of progressive weakness, found to have acute hypoxemic respiratory failure due to CAP and volume overload from heart failure requiring intubation. She received ABX for CAP, was diuresed but required prolonged weaning in setting of her weakness, secretions, and acute encephalopathy. In discussion with husband it was felt that she would not want tracheostomy. When she was able to pass spontaneous breathing trial 2023-07-05, Dr. Ruthann Cancer confirmed with the patient's husband that she would not be reintubated in the event she deteriorated from respiratory standpoint. She had one-way extubation on 05-Jul-2023, transition made to comfort measures only in company of her son and husband. She was pronounced dead at 1420.    Pertinent Labs and Studies  Significant Diagnostic Studies DG Abd 1 View  Result Date: 06/08/2021 CLINICAL DATA:  NG  placement. EXAM: ABDOMEN - 1 VIEW COMPARISON:  Chest radiograph dated 05/12/2021. FINDINGS: Enteric tube with tip in the distal stomach. Nonspecific bowel gas pattern. Left lung base opacity as seen on the prior radiograph may represent combination of a small pleural effusion and associated atelectasis/infiltrate. IMPRESSION: Enteric tube with tip in the distal stomach. Electronically Signed   By: Anner Crete M.D.   On: 06/08/2021 21:53   MR CERVICAL SPINE WO CONTRAST  Result Date: 06/10/2021 CLINICAL DATA:  Upper and lower extremity weakness EXAM: MRI CERVICAL SPINE WITHOUT CONTRAST TECHNIQUE: Multiplanar, multisequence MR imaging of the cervical spine was performed. No intravenous contrast was administered. COMPARISON:  None. FINDINGS: Alignment: Mild retrolisthesis C4-5 and C5-6. Mild anterolisthesis C7-T1 Vertebrae: Negative for fracture or mass. Cord: Normal signal and morphology Posterior Fossa, vertebral arteries, paraspinal tissues: Negative Disc levels: C2-3: Mild disc degeneration. Small right-sided disc protrusion. Negative for stenosis C3-4: Mild disc degeneration and facet degeneration. Mild left foraminal narrowing C4-5: Disc degeneration. Disc space narrowing and diffuse uncinate spurring. Bilateral facet degeneration. Mild foraminal narrowing bilaterally. C5-6: Disc degeneration with diffuse uncinate spurring. Bilateral facet degeneration. Mild spinal stenosis. Moderate foraminal stenosis bilaterally C6-7: Disc degeneration and uncinate spurring. Mild foraminal narrowing bilaterally C7-T1: Mild anterolisthesis with mild facet degeneration. Negative for stenosis. IMPRESSION: Multilevel disc and facet degeneration in the cervical spine. Mild spinal stenosis C5-6 with moderate foraminal narrowing bilaterally. Mild foraminal narrowing at additional levels as described above. Electronically Signed   By: Franchot Gallo M.D.   On: 06/10/2021 15:41   MR THORACIC SPINE WO CONTRAST  Result Date:  06/10/2021 CLINICAL DATA:  Upper and lower extremity weakness EXAM: MRI THORACIC SPINE WITHOUT  CONTRAST TECHNIQUE: Multiplanar, multisequence MR imaging of the thoracic spine was performed. No intravenous contrast was administered. COMPARISON:  None. FINDINGS: Alignment:  Slight anterolisthesis T3-4 otherwise normal alignment Vertebrae: Negative for fracture or mass Cord:  Normal signal and morphology Paraspinal and other soft tissues: Small bilateral effusions. No paraspinous mass. Disc levels: Disc degeneration throughout much of the thoracic spine with disc space narrowing and Schmorl's node. Small central disc protrusion at T6-7. Negative for thoracic spinal stenosis.  No cord compression IMPRESSION: Mild thoracic disc degeneration. Negative for spinal stenosis or cord compression. Small bilateral pleural effusions. Electronically Signed   By: Franchot Gallo M.D.   On: 06/10/2021 16:08   MR LUMBAR SPINE WO CONTRAST  Result Date: 06/10/2021 CLINICAL DATA:  Upper and lower extremity weakness. EXAM: MRI LUMBAR SPINE WITHOUT CONTRAST TECHNIQUE: Multiplanar, multisequence MR imaging of the lumbar spine was performed. No intravenous contrast was administered. COMPARISON:  Lumbar spine radiographs 04/17/2021 FINDINGS: Segmentation:  5 lumbar vertebra. Alignment:  Mild retrolisthesis L3-4 and L4-5 Vertebrae: Mild superior endplate fracture of L3 with bone marrow edema. This appears acute and is not seen on the recent study. No other fracture or mass lesion. Conus medullaris and cauda equina: Conus extends to the L1-2 level. Conus and cauda equina appear normal. Paraspinal and other soft tissues: Negative for paraspinous mass, adenopathy, or fluid collection. Disc levels: T12-L1: Mild disc degeneration with disc bulging and spurring. Negative for stenosis. L1-2: Negative L2-3: Mild disc bulging and mild facet degeneration. Shallow left-sided disc protrusion. Mild subarticular stenosis on the left. L3-4: Disc  degeneration with diffuse disc bulging and mild facet degeneration. Negative for stenosis. L4-5: Left laminectomy. Advanced disc degeneration with disc space narrowing and diffuse endplate spurring. Moderate subarticular and foraminal stenosis bilaterally. Spinal canal decompressed. Right-sided facet degeneration. Left facetectomy L5-S1: Mild disc degeneration and moderate facet degeneration. Mild subarticular stenosis bilaterally. IMPRESSION: Mild superior endplate fracture of L3 which appears acute with bone marrow edema. Multilevel lumbar degenerative change. Spinal canal adequately patent throughout the lumbar spine. Moderate subarticular and foraminal stenosis bilaterally L4-5. Electronically Signed   By: Franchot Gallo M.D.   On: 06/10/2021 16:12   DG CHEST PORT 1 VIEW  Result Date: 06/12/2021 CLINICAL DATA:  Acute respiratory failure EXAM: PORTABLE CHEST 1 VIEW COMPARISON:  Chest x-rays dated 06/11/2021 and 06/10/2021. FINDINGS: Improved aeration at the bilateral lung bases. Heart size and mediastinal contours are stable. No pneumothorax is seen. Endotracheal tube appears stable in position. Enteric tube passes below the diaphragm. IMPRESSION: 1. Improved aeration at the bilateral lung bases, suggesting improved atelectasis or edema. 2. Additional areas of patchy bilateral interstitial and alveolar airspace opacities do persist bilaterally, multifocal pneumonia versus interstitial edema. 3. Support apparatus appears stable in position. Electronically Signed   By: Franki Cabot M.D.   On: 06/12/2021 11:01   DG CHEST PORT 1 VIEW  Result Date: 06/11/2021 CLINICAL DATA:  Hypoxia. EXAM: PORTABLE CHEST 1 VIEW COMPARISON:  06/10/2021 FINDINGS: 0738 hours endotracheal tube tip is 4.4 cm above the base of the carina. The NG tube passes into the stomach although the distal tip position is not included on the film. Patchy bibasilar airspace disease is similar to prior with possible tiny bilateral pleural  effusions. Cardiopericardial silhouette is at upper limits of normal for size. Bones are diffusely demineralized. Telemetry leads overlie the chest. IMPRESSION: 1. Endotracheal tube tip is 4.4 cm above the base of the carina. 2. Patchy bibasilar airspace disease, similar to prior. Electronically Signed   By:  Misty Stanley M.D.   On: 06/11/2021 09:37   DG CHEST PORT 1 VIEW  Result Date: 06/10/2021 CLINICAL DATA:  87 year old female with history of hypoxia. Atrial fibrillation. EXAM: PORTABLE CHEST 1 VIEW COMPARISON:  Chest x-ray 06/08/2021. FINDINGS: An endotracheal tube is in place with tip 5.2 cm above the carina. A nasogastric tube is seen extending into the stomach, however, the tip of the nasogastric tube extends below the lower margin of the image. Patchy multifocal interstitial and airspace disease noted throughout the mid to lower lungs bilaterally. More dense opacification in the lung bases likely reflects associated atelectasis, and superimposed bilateral pleural effusions (moderate on the left and small on the right). Overall, aeration appears very similar to the prior examination. No pneumothorax. Pulmonary vasculature is largely obscured, but does not appear overly engorged. Heart size is normal. Upper mediastinal contours are within normal limits. Atherosclerotic calcifications in the thoracic aorta. IMPRESSION: 1. Support apparatus, as above. 2. Stable radiographic appearance of the chest, as above. 1. Electronically Signed   By: Vinnie Langton M.D.   On: 06/10/2021 08:05   DG CHEST PORT 1 VIEW  Result Date: 06/08/2021 CLINICAL DATA:  Feeding tube placement. EXAM: PORTABLE CHEST 1 VIEW COMPARISON:  Chest x-ray 06/08/2021. FINDINGS: Enteric tube extends below the diaphragm. Distal tip is not included on the image. Endotracheal tube tip is 5.5 cm above the carina. The heart is enlarged, unchanged. Central pulmonary vascular congestion persists. Right lower lobe airspace disease has increased.  Small left pleural effusion and left basilar opacities are unchanged. Osseous structures are stable. IMPRESSION: 1. Lines and tubes as above. 2. Increasing right lower lobe airspace disease. Otherwise stable exam. Electronically Signed   By: Ronney Asters M.D.   On: 06/08/2021 21:57   DG Chest Port 1 View  Result Date: 06/08/2021 CLINICAL DATA:  Follow-up pneumonia.  Ventilator support. EXAM: PORTABLE CHEST 1 VIEW COMPARISON:  06/06/2021 FINDINGS: Endotracheal tube tip 6 cm above the carina. Orogastric or nasogastric tube enters the stomach. There is considerable clearing of the previously seen right upper lobe pneumonia. Some persistent volume loss in the right lower lobe. Persistent left lower lobe collapse as seen previously. IMPRESSION: Marked radiographic improvement of the right upper lobe infiltrate. Persistent lower lobe volume loss, worse on the left than the right Electronically Signed   By: Nelson Chimes M.D.   On: 06/08/2021 08:00   DG Chest Port 1 View  Result Date: 06/06/2021 CLINICAL DATA:  Endotracheal tube. EXAM: PORTABLE CHEST 1 VIEW COMPARISON:  Chest x-ray 06/06/2021. FINDINGS: Endotracheal tube tip is 6.5 cm above the carina. Enteric tube extends below the diaphragm. Bilateral multifocal airspace disease is present, decreasing in the left upper lobe. Small pleural effusions persist. The heart is enlarged, unchanged. There is no pneumothorax. The cardiomediastinal silhouette is stable, the heart is enlarged. Osseous structures are stable. IMPRESSION: 1. Endotracheal tube tip 6.5 cm above carina. 2. Left upper lobe airspace disease is decreasing. Otherwise stable bilateral airspace disease. 3. Stable small pleural effusions. Electronically Signed   By: Ronney Asters M.D.   On: 06/06/2021 20:25   DG Chest Port 1 View  Result Date: 06/06/2021 CLINICAL DATA:  Pneumonia, acute respiratory failure. EXAM: PORTABLE CHEST 1 VIEW COMPARISON:  Chest x-ray 05/20/2021. FINDINGS: There is a new  endotracheal tube with distal tip 7 cm above the carina. Enteric tube extends below the diaphragm. Distal tip is not included on the image. The heart is enlarged, unchanged. There are increasing bilateral multifocal airspace opacities,  particularly in the upper lobes, right greater than left. Small pleural effusions are stable, left greater than right. There is no pneumothorax or acute fracture. IMPRESSION: 1. Endotracheal tube tip is 7 cm above the carina. Consider repositioning. Enteric tube extends below the diaphragm. 2. Increasing bilateral multifocal airspace disease. 3. Stable small pleural effusions. Electronically Signed   By: Ronney Asters M.D.   On: 06/06/2021 17:47   DG Chest Port 1 View  Result Date: 06/07/2021 CLINICAL DATA:  Shortness of breath. EXAM: PORTABLE CHEST 1 VIEW COMPARISON:  May 26, 2020 FINDINGS: The lungs are hyperinflated. Mild patchy atelectasis and/or infiltrate is seen within the mid right lung and right lung base. There is a small right pleural effusion. A moderate size left pleural effusion is also seen. No pneumothorax is identified. The heart size and mediastinal contours are within normal limits. There is mild calcification of the thoracic aorta multilevel degenerative changes are seen throughout the thoracic spine. IMPRESSION: 1. Mild patchy atelectasis and/or infiltrate within the mid right lung and right lung base with suspected airspace disease within the left lung base. Follow-up to resolution is recommended to exclude the presence of an underlying neoplasm. 2. Small right pleural effusion and moderate size left pleural effusion. Electronically Signed   By: Virgina Norfolk M.D.   On: 05/30/2021 19:27   DG Abd Portable 1V  Result Date: 06/11/2021 CLINICAL DATA:  Constipation EXAM: PORTABLE ABDOMEN - 1 VIEW COMPARISON:  06/08/2021 FINDINGS: Enteric tube is within the stomach. Air is noted throughout much of the colon without significant distension. Some stool  is present within the rectum. There is no significant stool burden. IMPRESSION: Enteric tube within stomach.  No significant stool burden. Electronically Signed   By: Macy Mis M.D.   On: 06/11/2021 09:38   ECHOCARDIOGRAM COMPLETE  Result Date: 06/06/2021    ECHOCARDIOGRAM REPORT   Patient Name:   [facility] Date of Exam: 06/06/2021 Medical Rec #:  [id number]   Height:       66.0 in Accession #:    [id number]  Weight:       142.0 lb Date of Birth:  07-09-1935   BSA:          1.729 m Patient Age:    89 years    BP:           151/58 mmHg Patient Gender: F           HR:           64 bpm. Exam Location:  Inpatient Procedure: 2D Echo, Cardiac Doppler and Color Doppler Indications:    Dyspnea R06.00  History:        Patient has prior history of Echocardiogram examinations, most                 recent 03/12/2020. CAD; Arrythmias:Atrial Fibrillation and                 Tachycardia. Pneumonia.  Sonographer:    Darlina Sicilian RDCS Referring Phys: [id number] Eben Burow  Sonographer Comments: Image acquisition challenging due to respiratory motion. Requiring oxygen with high flow nasal cannula at this time IMPRESSIONS  1. Left ventricular ejection fraction, by estimation, is >75%. The left ventricle has hyperdynamic function. The left ventricle has no regional wall motion abnormalities. There is mild left ventricular hypertrophy. Left ventricular diastolic parameters are consistent with Grade II diastolic dysfunction (pseudonormalization). Elevated left atrial pressure.  2. Right ventricular systolic function is normal. The right ventricular size  is normal. There is severely elevated pulmonary artery systolic pressure.  3. Right atrial size was mildly dilated.  4. Large pleural effusion.  5. The mitral valve is normal in structure. Mild mitral valve regurgitation.  6. Tricuspid valve regurgitation is moderate.  7. The aortic valve is tricuspid. Aortic valve regurgitation is mild. Aortic valve sclerosis is present,  with no evidence of aortic valve stenosis. Comparison(s): The left ventricular function is unchanged. FINDINGS  Left Ventricle: Left ventricular ejection fraction, by estimation, is >75%. The left ventricle has hyperdynamic function. The left ventricle has no regional wall motion abnormalities. The left ventricular internal cavity size was normal in size. There is mild left ventricular hypertrophy. Left ventricular diastolic parameters are consistent with Grade II diastolic dysfunction (pseudonormalization). Elevated left atrial pressure. Right Ventricle: The right ventricular size is normal. Right vetricular wall thickness was not assessed. Right ventricular systolic function is normal. There is severely elevated pulmonary artery systolic pressure. The tricuspid regurgitant velocity is 3.46 m/s, and with an assumed right atrial pressure of 15 mmHg, the estimated right ventricular systolic pressure is 62.9 mmHg. Left Atrium: Left atrial size was normal in size. Right Atrium: Right atrial size was mildly dilated. Pericardium: Trivial pericardial effusion is present. Mitral Valve: The mitral valve is normal in structure. Mild mitral valve regurgitation. Tricuspid Valve: The tricuspid valve is normal in structure. Tricuspid valve regurgitation is moderate. Aortic Valve: The aortic valve is tricuspid. Aortic valve regurgitation is mild. Aortic valve sclerosis is present, with no evidence of aortic valve stenosis. Pulmonic Valve: The pulmonic valve was normal in structure. Pulmonic valve regurgitation is mild. Aorta: The aortic root is normal in size and structure. IAS/Shunts: The interatrial septum was not assessed. Additional Comments: There is a large pleural effusion.  LEFT VENTRICLE PLAX 2D LVIDd:         4.40 cm   Diastology LVIDs:         2.10 cm   LV e' medial:    5.96 cm/s LV PW:         0.90 cm   LV E/e' medial:  21.9 LV IVS:        1.30 cm   LV e' lateral:   8.36 cm/s LVOT diam:     1.80 cm   LV E/e' lateral:  15.6 LV SV:         55 LV SV Index:   32 LVOT Area:     2.54 cm  RIGHT VENTRICLE RV S prime:     11.90 cm/s TAPSE (M-mode): 1.8 cm LEFT ATRIUM             Index        RIGHT ATRIUM           Index LA diam:        3.30 cm 1.91 cm/m   RA Area:     16.40 cm LA Vol (A2C):   33.0 ml 19.09 ml/m  RA Volume:   41.20 ml  23.83 ml/m LA Vol (A4C):   23.9 ml 13.82 ml/m LA Biplane Vol: 28.0 ml 16.20 ml/m  AORTIC VALVE             PULMONIC VALVE LVOT Vmax:   114.50 cm/s PR End Diast Vel: 4.24 msec LVOT Vmean:  70.750 cm/s LVOT VTI:    0.217 m  AORTA Ao Root diam: 3.00 cm MITRAL VALVE                TRICUSPID VALVE MV Area (PHT):  4.70 cm     TR Peak grad:   47.9 mmHg MV Decel Time: 161 msec     TR Vmax:        346.00 cm/s MV E velocity: 130.67 cm/s                             SHUNTS                             Systemic VTI:  0.22 m                             Systemic Diam: 1.80 cm Dietrich PatesPaula Ross MD Electronically signed by Dietrich PatesPaula Ross MD Signature Date/Time: 06/06/2021/11:45:59 AM    Final    XR C-ARM NO REPORT  Result Date: 05/17/2021 Please see Notes tab for imaging impression.   Microbiology Recent Results (from the past 240 hour(s))  Resp Panel by RT-PCR (Flu A&B, Covid)     Status: None   Collection Time: 05/27/2021  5:28 PM   Specimen: Nasopharyngeal(NP) swabs in vial transport medium  Result Value Ref Range Status   SARS Coronavirus 2 by RT PCR NEGATIVE NEGATIVE Final    Comment: (NOTE) SARS-CoV-2 target nucleic acids are NOT DETECTED.  The SARS-CoV-2 RNA is generally detectable in upper respiratory specimens during the acute phase of infection. The lowest concentration of SARS-CoV-2 viral copies this assay can detect is 138 copies/mL. A negative result does not preclude SARS-Cov-2 infection and should not be used as the sole basis for treatment or other patient management decisions. A negative result may occur with  improper specimen collection/handling, submission of specimen other than  nasopharyngeal swab, presence of viral mutation(s) within the areas targeted by this assay, and inadequate number of viral copies(<138 copies/mL). A negative result must be combined with clinical observations, patient history, and epidemiological information. The expected result is Negative.  Fact Sheet for Patients:  BloggerCourse.comhttps://www.fda.gov/media/152166/download  Fact Sheet for Healthcare Providers:  SeriousBroker.ithttps://www.fda.gov/media/152162/download  This test is no t yet approved or cleared by the [place] FDA and  has been authorized for detection and/or diagnosis of SARS-CoV-2 by FDA under an Emergency Use Authorization (EUA). This EUA will remain  in effect (meaning this test can be used) for the duration of the COVID-19 declaration under Section 564(b)(1) of the Act, 21 U.S.C.section 360bbb-3(b)(1), unless the authorization is terminated  or revoked sooner.       Influenza A by PCR NEGATIVE NEGATIVE Final   Influenza B by PCR NEGATIVE NEGATIVE Final    Comment: (NOTE) The Xpert Xpress SARS-CoV-2/FLU/RSV plus assay is intended as an aid in the diagnosis of influenza from Nasopharyngeal swab specimens and should not be used as a sole basis for treatment. Nasal washings and aspirates are unacceptable for Xpert Xpress SARS-CoV-2/FLU/RSV testing.  Fact Sheet for Patients: BloggerCourse.comhttps://www.fda.gov/media/152166/download  Fact Sheet for Healthcare Providers: SeriousBroker.ithttps://www.fda.gov/media/152162/download  This test is not yet approved or cleared by the [place] FDA and has been authorized for detection and/or diagnosis of SARS-CoV-2 by FDA under an Emergency Use Authorization (EUA). This EUA will remain in effect (meaning this test can be used) for the duration of the COVID-19 declaration under Section 564(b)(1) of the Act, 21 U.S.C. section 360bbb-3(b)(1), unless the authorization is terminated or revoked.  Performed at [facility], [address] [place], [place] [address]  Culture, blood (Routine x 2)     Status: None   Collection Time: 06/08/2021  5:59 PM   Specimen: BLOOD  Result Value Ref Range Status   Specimen Description BLOOD LEFT ANTECUBITAL  Final   Special Requests   Final    BOTTLES DRAWN AEROBIC AND ANAEROBIC Blood Culture results may not be optimal due to an inadequate volume of blood received in culture bottles   Culture   Final    NO GROWTH 5 DAYS Performed at [facility], [place] [address] [place], [place] [address]    Report Status 06/10/2021 FINAL  Final  Culture, blood (Routine x 2)     Status: None   Collection Time: 05/26/2021  8:00 PM   Specimen: BLOOD RIGHT HAND  Result Value Ref Range Status   Specimen Description BLOOD RIGHT HAND  Final   Special Requests   Final    BOTTLES DRAWN AEROBIC AND ANAEROBIC Blood Culture results may not be optimal due to an excessive volume of blood received in culture bottles   Culture   Final    NO GROWTH 5 DAYS Performed at [facility], [place] [address], [place], [place] [address]    Report Status 06/10/2021 FINAL  Final  MRSA Next Gen by PCR, Nasal     Status: None   Collection Time: 06/06/21  8:07 PM   Specimen: Nasal Mucosa; Nasal Swab  Result Value Ref Range Status   MRSA by PCR Next Gen NOT DETECTED NOT DETECTED Final    Comment: (NOTE) The GeneXpert MRSA Assay (FDA approved for NASAL specimens only), is one component of a comprehensive MRSA colonization surveillance program. It is not intended to diagnose MRSA infection nor to guide or monitor treatment for MRSA infections. Test performance is not FDA approved in patients less than 64 years old. Performed at [facility], [place] [address], [place], [place] [address]     Lab Basic Metabolic Panel: Recent Labs  Lab 06/09/21 0404 06/09/21 1531 06/10/21 0435 06/10/21 1643 06/11/21 0513 06/11/21 1457 06/12/21 0620 06-14-2021 0039  NA 140 141 139  --  144 144 143 143  K 4.0 3.8 3.6  --  4.3 4.1 3.8 4.1  CL 104 104  106  --  105 106 103 102  CO2 26 28 28   --  33* 32 33* 36*  GLUCOSE 92 111* 174*  --  131* 168* 139* 143*  BUN 13 15 25*  --  22 22 22  24*  CREATININE 0.51 0.65 0.70  --  0.57 0.59 0.53 0.57  CALCIUM 8.8* 8.6* 8.7*  --  8.9 8.7* 8.7* 8.7*  MG 2.2 2.1 2.2 2.3 2.3  --   --   --   PHOS 3.4 3.2 2.6 3.0 2.8  --   --   --    Liver Function Tests: Recent Labs  Lab 06/09/21 0404  AST 33  ALT 33  ALKPHOS 89  BILITOT 1.2  PROT 5.5*  ALBUMIN 2.3*   No results for input(s): LIPASE, AMYLASE in the last 168 hours. No results for input(s): AMMONIA in the last 168 hours. CBC: Recent Labs  Lab 06/09/21 0404 06/10/21 0435 06/11/21 0513 06/12/21 0620 14-Jun-2021 0039  WBC 11.0* 12.4* 12.8* 11.7* 11.6*  NEUTROABS  --  10.2*  --   --   --   HGB 12.3 12.1 12.3 11.7* 11.5*  HCT 38.2 36.9 38.0 37.2 37.2  MCV 97.4 98.4 99.2 99.5 100.0  PLT 212 239  258 280 329   Cardiac Enzymes: No results for input(s): CKTOTAL, CKMB, CKMBINDEX, TROPONINI in the last 168 hours. Sepsis Labs: Recent Labs  Lab 06/10/21 0435 06/11/21 0513 06/12/21 0620 2021/06/19 0039  WBC 12.4* 12.8* 11.7* 11.6*    Procedures/Operations  11/28 endotracheal intubation 11/30 endotracheal intubation   Maryjane Hurter 06/15/2021, 4:09 PM

## 2021-07-10 NOTE — Progress Notes (Signed)
Patient TOD 1420, son and husband at bedside. Pronounced by 2 RNs. Emotional support provided for family, provider notified.

## 2021-07-10 NOTE — Procedures (Signed)
Extubation Procedure Note  Patient Details:   Name: Brooke [name] DOB: 1934-09-11 MRN: [id number]   Airway Documentation:    Vent end date: 07/05/2021 Vent end time: 1206   Evaluation  O2 sats:  comfort care  Complications: No apparent complications Patient did tolerate procedure well. Bilateral Breath Sounds: Rhonchi   No  Pt was terminally extubation per MD.   Merlene Laughter 06/12/2021, 12:11 PM

## 2021-07-10 DEATH — deceased

## 2021-08-29 ENCOUNTER — Ambulatory Visit (HOSPITAL_BASED_OUTPATIENT_CLINIC_OR_DEPARTMENT_OTHER): Payer: Medicare Other | Admitting: Cardiology
